# Patient Record
Sex: Female | Born: 1960 | Race: White | Hispanic: Yes | Marital: Married | Smoking: Never smoker
Health system: Southern US, Community
[De-identification: ages and names within clinical notes are randomized; demographics above are authoritative.]

## PROBLEM LIST (undated history)

## (undated) DIAGNOSIS — K8020 Calculus of gallbladder without cholecystitis without obstruction: Secondary | ICD-10-CM

## (undated) DIAGNOSIS — K219 Gastro-esophageal reflux disease without esophagitis: Secondary | ICD-10-CM

## (undated) DIAGNOSIS — E039 Hypothyroidism, unspecified: Secondary | ICD-10-CM

## (undated) DIAGNOSIS — F329 Major depressive disorder, single episode, unspecified: Secondary | ICD-10-CM

## (undated) DIAGNOSIS — F32A Depression, unspecified: Secondary | ICD-10-CM

## (undated) DIAGNOSIS — R7303 Prediabetes: Secondary | ICD-10-CM

## (undated) DIAGNOSIS — N6019 Diffuse cystic mastopathy of unspecified breast: Secondary | ICD-10-CM

## (undated) DIAGNOSIS — G43909 Migraine, unspecified, not intractable, without status migrainosus: Secondary | ICD-10-CM

## (undated) DIAGNOSIS — E785 Hyperlipidemia, unspecified: Secondary | ICD-10-CM

## (undated) DIAGNOSIS — R0989 Other specified symptoms and signs involving the circulatory and respiratory systems: Secondary | ICD-10-CM

## (undated) HISTORY — DX: Gastro-esophageal reflux disease without esophagitis: K21.9

## (undated) HISTORY — DX: Prediabetes: R73.03

## (undated) HISTORY — DX: Hypothyroidism, unspecified: E03.9

## (undated) HISTORY — DX: Diffuse cystic mastopathy of unspecified breast: N60.19

## (undated) HISTORY — DX: Depression, unspecified: F32.A

## (undated) HISTORY — DX: Hyperlipidemia, unspecified: E78.5

## (undated) HISTORY — DX: Calculus of gallbladder without cholecystitis without obstruction: K80.20

## (undated) HISTORY — DX: Major depressive disorder, single episode, unspecified: F32.9

## (undated) HISTORY — DX: Migraine, unspecified, not intractable, without status migrainosus: G43.909

## (undated) HISTORY — DX: Other specified symptoms and signs involving the circulatory and respiratory systems: R09.89

---

## 2000-09-04 ENCOUNTER — Other Ambulatory Visit: Admission: RE | Admit: 2000-09-04 | Discharge: 2000-09-04 | Payer: Self-pay | Admitting: *Deleted

## 2000-12-02 ENCOUNTER — Ambulatory Visit (HOSPITAL_COMMUNITY): Admission: RE | Admit: 2000-12-02 | Discharge: 2000-12-02 | Payer: Self-pay | Admitting: *Deleted

## 2000-12-02 ENCOUNTER — Encounter: Payer: Self-pay | Admitting: *Deleted

## 2002-01-21 ENCOUNTER — Other Ambulatory Visit: Admission: RE | Admit: 2002-01-21 | Discharge: 2002-01-21 | Payer: Self-pay | Admitting: *Deleted

## 2004-12-21 ENCOUNTER — Other Ambulatory Visit: Admission: RE | Admit: 2004-12-21 | Discharge: 2004-12-21 | Payer: Self-pay | Admitting: Internal Medicine

## 2006-03-12 HISTORY — PX: OTHER SURGICAL HISTORY: SHX169

## 2006-05-27 ENCOUNTER — Encounter: Admission: RE | Admit: 2006-05-27 | Discharge: 2006-05-27 | Payer: Self-pay | Admitting: Endocrinology

## 2006-07-01 ENCOUNTER — Encounter: Admission: RE | Admit: 2006-07-01 | Discharge: 2006-07-01 | Payer: Self-pay | Admitting: Endocrinology

## 2007-01-29 ENCOUNTER — Other Ambulatory Visit: Admission: RE | Admit: 2007-01-29 | Discharge: 2007-01-29 | Payer: Self-pay | Admitting: Internal Medicine

## 2009-10-19 ENCOUNTER — Ambulatory Visit (HOSPITAL_COMMUNITY): Admission: RE | Admit: 2009-10-19 | Discharge: 2009-10-19 | Payer: Self-pay | Admitting: Internal Medicine

## 2010-03-15 ENCOUNTER — Other Ambulatory Visit
Admission: RE | Admit: 2010-03-15 | Discharge: 2010-03-15 | Payer: Self-pay | Source: Home / Self Care | Admitting: Internal Medicine

## 2011-01-11 ENCOUNTER — Other Ambulatory Visit: Payer: Self-pay | Admitting: Emergency Medicine

## 2011-01-11 ENCOUNTER — Ambulatory Visit
Admission: RE | Admit: 2011-01-11 | Discharge: 2011-01-11 | Disposition: A | Discharge status: Home / Self Care | Payer: 59 | Source: Ambulatory Visit | Attending: Emergency Medicine | Admitting: Emergency Medicine

## 2011-01-11 DIAGNOSIS — R519 Headache, unspecified: Secondary | ICD-10-CM

## 2011-01-16 ENCOUNTER — Other Ambulatory Visit: Payer: Self-pay | Admitting: Internal Medicine

## 2011-01-16 DIAGNOSIS — R51 Headache: Secondary | ICD-10-CM

## 2011-01-16 DIAGNOSIS — Z8249 Family history of ischemic heart disease and other diseases of the circulatory system: Secondary | ICD-10-CM

## 2011-01-17 ENCOUNTER — Ambulatory Visit
Admission: RE | Admit: 2011-01-17 | Discharge: 2011-01-17 | Disposition: A | Discharge status: Home / Self Care | Payer: 59 | Source: Ambulatory Visit | Attending: Internal Medicine | Admitting: Internal Medicine

## 2011-01-17 DIAGNOSIS — Z8249 Family history of ischemic heart disease and other diseases of the circulatory system: Secondary | ICD-10-CM

## 2011-01-17 DIAGNOSIS — R51 Headache: Secondary | ICD-10-CM

## 2011-03-22 ENCOUNTER — Other Ambulatory Visit: Payer: Self-pay | Admitting: Internal Medicine

## 2011-03-22 DIAGNOSIS — K118 Other diseases of salivary glands: Secondary | ICD-10-CM

## 2011-03-27 ENCOUNTER — Ambulatory Visit
Admission: RE | Admit: 2011-03-27 | Discharge: 2011-03-27 | Disposition: A | Discharge status: Home / Self Care | Payer: 59 | Source: Ambulatory Visit | Attending: Internal Medicine | Admitting: Internal Medicine

## 2011-03-27 DIAGNOSIS — K118 Other diseases of salivary glands: Secondary | ICD-10-CM

## 2013-05-05 ENCOUNTER — Encounter: Payer: Self-pay | Admitting: Physician Assistant

## 2013-08-12 ENCOUNTER — Encounter: Payer: Self-pay | Admitting: Physician Assistant

## 2013-10-27 ENCOUNTER — Ambulatory Visit (INDEPENDENT_AMBULATORY_CARE_PROVIDER_SITE_OTHER): Payer: 59 | Admitting: Physician Assistant

## 2013-10-27 ENCOUNTER — Encounter: Payer: Self-pay | Admitting: Physician Assistant

## 2013-10-27 VITALS — BP 120/78 | HR 80 | Temp 97.9°F | Resp 16 | Ht 64.0 in | Wt 186.0 lb

## 2013-10-27 DIAGNOSIS — E559 Vitamin D deficiency, unspecified: Secondary | ICD-10-CM | POA: Insufficient documentation

## 2013-10-27 DIAGNOSIS — E039 Hypothyroidism, unspecified: Secondary | ICD-10-CM | POA: Insufficient documentation

## 2013-10-27 DIAGNOSIS — N912 Amenorrhea, unspecified: Secondary | ICD-10-CM

## 2013-10-27 DIAGNOSIS — F329 Major depressive disorder, single episode, unspecified: Secondary | ICD-10-CM

## 2013-10-27 DIAGNOSIS — R7303 Prediabetes: Secondary | ICD-10-CM

## 2013-10-27 DIAGNOSIS — E785 Hyperlipidemia, unspecified: Secondary | ICD-10-CM | POA: Insufficient documentation

## 2013-10-27 DIAGNOSIS — F325 Major depressive disorder, single episode, in full remission: Secondary | ICD-10-CM

## 2013-10-27 DIAGNOSIS — R7309 Other abnormal glucose: Secondary | ICD-10-CM

## 2013-10-27 DIAGNOSIS — F32A Depression, unspecified: Secondary | ICD-10-CM

## 2013-10-27 DIAGNOSIS — K219 Gastro-esophageal reflux disease without esophagitis: Secondary | ICD-10-CM | POA: Insufficient documentation

## 2013-10-27 DIAGNOSIS — Z79899 Other long term (current) drug therapy: Secondary | ICD-10-CM

## 2013-10-27 HISTORY — DX: Major depressive disorder, single episode, in full remission: F32.5

## 2013-10-27 LAB — CBC WITH DIFFERENTIAL/PLATELET
BASOS ABS: 0 10*3/uL (ref 0.0–0.1)
BASOS PCT: 0 % (ref 0–1)
EOS PCT: 1 % (ref 0–5)
Eosinophils Absolute: 0.1 10*3/uL (ref 0.0–0.7)
HEMATOCRIT: 40.6 % (ref 36.0–46.0)
Hemoglobin: 13.9 g/dL (ref 12.0–15.0)
Lymphocytes Relative: 33 % (ref 12–46)
Lymphs Abs: 2.3 10*3/uL (ref 0.7–4.0)
MCH: 30 pg (ref 26.0–34.0)
MCHC: 34.2 g/dL (ref 30.0–36.0)
MCV: 87.7 fL (ref 78.0–100.0)
MONO ABS: 0.4 10*3/uL (ref 0.1–1.0)
Monocytes Relative: 5 % (ref 3–12)
NEUTROS ABS: 4.3 10*3/uL (ref 1.7–7.7)
Neutrophils Relative %: 61 % (ref 43–77)
Platelets: 239 10*3/uL (ref 150–400)
RBC: 4.63 MIL/uL (ref 3.87–5.11)
RDW: 13.5 % (ref 11.5–15.5)
WBC: 7.1 10*3/uL (ref 4.0–10.5)

## 2013-10-27 LAB — HEMOGLOBIN A1C
Hgb A1c MFr Bld: 6 % — ABNORMAL HIGH (ref <5.7)
Mean Plasma Glucose: 126 mg/dL — ABNORMAL HIGH (ref <117)

## 2013-10-27 NOTE — Progress Notes (Signed)
Assessment and Plan:  Hypertension: Continue medication, monitor blood pressure at home. Continue DASH diet. Cholesterol: Continue diet and exercise. Check cholesterol.  Pre-diabetes-Continue diet and exercise. Check A1C Vitamin D Def- check level and continue medications.  Left plantar faciitis- conservative measures, aleve, RICE Irreg menses- likely menopause- will refer to OB GYN for evaluation Depression/insomnia- try melatonin, increase exercise  Continue diet and meds as discussed. Further disposition pending results of labs.  HPI 53 y.o. female  presents for 3 month follow up with hypertension, hyperlipidemia, prediabetes and vitamin D. Her blood pressure has been controlled at home, today their BP is BP: 120/78 mmHg She does not workout. She denies chest pain, shortness of breath, dizziness.  She has been working on diet and exercise for prediabetes, and denies paresthesia of the feet, polydipsia and polyuria.  Patient is on Vitamin D supplement.   She has been under a lot of stress due to her family and husband. She does not want a pill that would cause addiction. She has been crying a lot and having a hard time sleeping at night. She has left heel pain from injuring it on a trampoline 2 months ago and it is getting better.  She has been having irregular menses, with not having one for 3 months and then having very light ones. Last PAP was 2012.  She is on thyroid medication. Her medication was not changed last visit. Patient denies change in energy level, diarrhea and heat / cold intolerance.   Current Medications:    Medication List       This list is accurate as of: 10/27/13 12:57 PM.  Always use your most recent med list.               IRON PO  Take by mouth daily.     levothyroxine 50 MCG tablet  Commonly known as:  SYNTHROID, LEVOTHROID  Take 50 mcg by mouth daily before breakfast.     VITAMIN D PO  Take 5,000 Int'l Units by mouth daily.       Medical  History: No past medical history on file. Allergies: Allergies no known allergies   Review of Systems: [X]  = complains of  [ ]  = denies  General: Fatigue [ ]  Fever [ ]  Chills [ ]  Weakness [ ]   Insomnia [x]  Eyes: Redness [ ]  Blurred vision [ ]  Diplopia [ ]   ENT: Congestion [ ]  Sinus Pain [ ]  Post Nasal Drip [ ]  Sore Throat [ ]  Earache [ ]   Cardiac: Chest pain/pressure [ ]  SOB [ ]  Orthopnea [ ]   Palpitations [ ]   Paroxysmal nocturnal dyspnea[ ]  Claudication [ ]  Edema [ ]   Pulmonary: Cough [ ]  Wheezing[ ]   SOB [ ]   Snoring [ ]   GI: Nausea [ ]  Vomiting[ ]  Dysphagia[ ]  Heartburn[ ]  Abdominal pain [ ]  Constipation [ ] ; Diarrhea [ ] ; BRBPR [ ]  Melena[ ]  GU: Hematuria[ ]  Dysuria [ ]  Nocturia[ ]  Urgency [ ]   Hesitancy [ ]  Discharge [ ]  Neuro: Headaches[ ]  Vertigo[ ]  Paresthesias[ ]  Spasm [ ]  Speech changes [ ]  Incoordination [ ]   Ortho: Arthritis [x ] Joint pain [ ]  Muscle pain [ ]  Joint swelling [ ]  Back Pain [ ]  Skin:  Rash [ ]   Pruritis [ ]  Change in skin lesion [ ]   Psych: Depression[ x] Anxiety[x ] Confusion [ ]  Memory loss [ ]   Heme/Lypmh: Bleeding [ ]  Bruising [ ]  Enlarged lymph nodes [ ]   Endocrine: Visual blurring [ ]   Paresthesia [ ]  Polyuria [ ]  Polydypsea [ ]    Heat/cold intolerance [ ]  Hypoglycemia [ ]   Family history- Review and unchanged Social history- Review and unchanged Physical Exam: BP 120/78  Pulse 80  Temp(Src) 97.9 F (36.6 C)  Resp 16  Ht 5\' 4"  (1.626 m)  Wt 186 lb (84.369 kg)  BMI 31.91 kg/m2 Wt Readings from Last 3 Encounters:  10/27/13 186 lb (84.369 kg)   General Appearance: Well nourished, in no apparent distress. Eyes: PERRLA, EOMs, conjunctiva no swelling or erythema Sinuses: No Frontal/maxillary tenderness ENT/Mouth: Ext aud canals clear, TMs without erythema, bulging. No erythema, swelling, or exudate on post pharynx.  Tonsils not swollen or erythematous. Hearing normal.  Neck: Supple, thyroid normal.  Respiratory: Respiratory effort normal, BS equal  bilaterally without rales, rhonchi, wheezing or stridor.  Cardio: RRR with no MRGs. Brisk peripheral pulses without edema.  Abdomen: Soft, + BS.  Non tender, no guarding, rebound, hernias, masses. Lymphatics: Non tender without lymphadenopathy.  Musculoskeletal: Full ROM, 5/5 strength, normal gait. + Left heel pain Skin: Warm, dry without rashes, lesions, ecchymosis.  Neuro: Cranial nerves intact. Normal muscle tone, no cerebellar symptoms. Sensation intact.  Psych: Awake and oriented X 3, normal affect, Insight and Judgment appropriate.    Quentin Mullingollier, Amanda 12:10 PM

## 2013-10-27 NOTE — Patient Instructions (Addendum)
Can try MELATONIN 5mg  1-2 at night Or can try Tylenol PM 1-2 at night  Insomnia Insomnia is frequent trouble falling and/or staying asleep. Insomnia can be a long term problem or a short term problem. Both are common. Insomnia can be a short term problem when the wakefulness is related to a certain stress or worry. Long term insomnia is often related to ongoing stress during waking hours and/or poor sleeping habits. Overtime, sleep deprivation itself can make the problem worse. Every little thing feels more severe because you are overtired and your ability to cope is decreased. CAUSES   Stress, anxiety, and depression.  Poor sleeping habits.  Distractions such as TV in the bedroom.  Naps close to bedtime.  Engaging in emotionally charged conversations before bed.  Technical reading before sleep.  Alcohol and other sedatives. They may make the problem worse. They can hurt normal sleep patterns and normal dream activity.  Stimulants such as caffeine for several hours prior to bedtime.  Pain syndromes and shortness of breath can cause insomnia.  Exercise late at night.  Changing time zones may cause sleeping problems (jet lag). It is sometimes helpful to have someone observe your sleeping patterns. They should look for periods of not breathing during the night (sleep apnea). They should also look to see how long those periods last. If you live alone or observers are uncertain, you can also be observed at a sleep clinic where your sleep patterns will be professionally monitored. Sleep apnea requires a checkup and treatment. Give your caregivers your medical history. Give your caregivers observations your family has made about your sleep.  SYMPTOMS   Not feeling rested in the morning.  Anxiety and restlessness at bedtime.  Difficulty falling and staying asleep. TREATMENT   Your caregiver may prescribe treatment for an underlying medical disorders. Your caregiver can give advice or  help if you are using alcohol or other drugs for self-medication. Treatment of underlying problems will usually eliminate insomnia problems.  Medications can be prescribed for short time use. They are generally not recommended for lengthy use.  Over-the-counter sleep medicines are not recommended for lengthy use. They can be habit forming.  You can promote easier sleeping by making lifestyle changes such as:  Using relaxation techniques that help with breathing and reduce muscle tension.  Exercising earlier in the day.  Changing your diet and the time of your last meal. No night time snacks.  Establish a regular time to go to bed.  Counseling can help with stressful problems and worry.  Soothing music and white noise may be helpful if there are background noises you cannot remove.  Stop tedious detailed work at least one hour before bedtime. HOME CARE INSTRUCTIONS   Keep a diary. Inform your caregiver about your progress. This includes any medication side effects. See your caregiver regularly. Take note of:  Times when you are asleep.  Times when you are awake during the night.  The quality of your sleep.  How you feel the next day. This information will help your caregiver care for you.  Get out of bed if you are still awake after 15 minutes. Read or do some quiet activity. Keep the lights down. Wait until you feel sleepy and go back to bed.  Keep regular sleeping and waking hours. Avoid naps.  Exercise regularly.  Avoid distractions at bedtime. Distractions include watching television or engaging in any intense or detailed activity like attempting to balance the household checkbook.  Develop a bedtime  ritual. Keep a familiar routine of bathing, brushing your teeth, climbing into bed at the same time each night, listening to soothing music. Routines increase the success of falling to sleep faster.  Use relaxation techniques. This can be using breathing and muscle tension  release routines. It can also include visualizing peaceful scenes. You can also help control troubling or intruding thoughts by keeping your mind occupied with boring or repetitive thoughts like the old concept of counting sheep. You can make it more creative like imagining planting one beautiful flower after another in your backyard garden.  During your day, work to eliminate stress. When this is not possible use some of the previous suggestions to help reduce the anxiety that accompanies stressful situations. MAKE SURE YOU:   Understand these instructions.  Will watch your condition.  Will get help right away if you are not doing well or get worse. Document Released: 02/24/2000 Document Revised: 05/21/2011 Document Reviewed: 03/26/2007 [facility] Patient Information 2015 [place], [place]. This information is not intended to replace advice given to you by your health care provider. Make sure you discuss any questions you have with your health care provider.  Plantar Fasciitis (Heel Spur Syndrome) with Rehab The plantar fascia is a fibrous, ligament-like, soft-tissue structure that spans the bottom of the foot. Plantar fasciitis is a condition that causes pain in the foot due to inflammation of the tissue. SYMPTOMS   Pain and tenderness on the underneath side of the foot.  Pain that worsens with standing or walking. CAUSES  Plantar fasciitis is caused by irritation and injury to the plantar fascia on the underneath side of the foot. Common mechanisms of injury include:  Direct trauma to bottom of the foot.  Damage to a small nerve that runs under the foot where the main fascia attaches to the heel bone.  Stress placed on the plantar fascia due to bone spurs. RISK INCREASES WITH:   Activities that place stress on the plantar fascia (running, jumping, pivoting, or cutting).  Poor strength and flexibility.  Improperly fitted shoes.  Tight calf muscles.  Flat feet.  Failure to  warm-up properly before activity.  Obesity. PREVENTION  Warm up and stretch properly before activity.  Allow for adequate recovery between workouts.  Maintain physical fitness:  Strength, flexibility, and endurance.  Cardiovascular fitness.  Maintain a health body weight.  Avoid stress on the plantar fascia.  Wear properly fitted shoes, including arch supports for individuals who have flat feet. PROGNOSIS  If treated properly, then the symptoms of plantar fasciitis usually resolve without surgery. However, occasionally surgery is necessary. RELATED COMPLICATIONS   Recurrent symptoms that may result in a chronic condition.  Problems of the lower back that are caused by compensating for the injury, such as limping.  Pain or weakness of the foot during push-off following surgery.  Chronic inflammation, scarring, and partial or complete fascia tear, occurring more often from repeated injections. TREATMENT  Treatment initially involves the use of ice and medication to help reduce pain and inflammation. The use of strengthening and stretching exercises may help reduce pain with activity, especially stretches of the Achilles tendon. These exercises may be performed at home or with a therapist. Your caregiver may recommend that you use heel cups of arch supports to help reduce stress on the plantar fascia. Occasionally, corticosteroid injections are given to reduce inflammation. If symptoms persist for greater than 6 months despite non-surgical (conservative), then surgery may be recommended.  MEDICATION   If pain medication is  necessary, then nonsteroidal anti-inflammatory medications, such as aspirin and ibuprofen, or other minor pain relievers, such as acetaminophen, are often recommended.  Do not take pain medication within 7 days before surgery.  Prescription pain relievers may be given if deemed necessary by your caregiver. Use only as directed and only as much as you  need.  Corticosteroid injections may be given by your caregiver. These injections should be reserved for the most serious cases, because they may only be given a certain number of times. HEAT AND COLD  Cold treatment (icing) relieves pain and reduces inflammation. Cold treatment should be applied for 10 to 15 minutes every 2 to 3 hours for inflammation and pain and immediately after any activity that aggravates your symptoms. Use ice packs or massage the area with a piece of ice (ice massage).  Heat treatment may be used prior to performing the stretching and strengthening activities prescribed by your caregiver, physical therapist, or athletic trainer. Use a heat pack or soak the injury in warm water. SEEK IMMEDIATE MEDICAL CARE IF:  Treatment seems to offer no benefit, or the condition worsens.  Any medications produce adverse side effects. EXERCISES RANGE OF MOTION (ROM) AND STRETCHING EXERCISES - Plantar Fasciitis (Heel Spur Syndrome) These exercises may help you when beginning to rehabilitate your injury. Your symptoms may resolve with or without further involvement from your physician, physical therapist or athletic trainer. While completing these exercises, remember:   Restoring tissue flexibility helps normal motion to return to the joints. This allows healthier, less painful movement and activity.  An effective stretch should be held for at least 30 seconds.  A stretch should never be painful. You should only feel a gentle lengthening or release in the stretched tissue. RANGE OF MOTION - Toe Extension, Flexion  Sit with your right / left leg crossed over your opposite knee.  Grasp your toes and gently pull them back toward the top of your foot. You should feel a stretch on the bottom of your toes and/or foot.  Hold this stretch for __________ seconds.  Now, gently pull your toes toward the bottom of your foot. You should feel a stretch on the top of your toes and or  foot.  Hold this stretch for __________ seconds. Repeat __________ times. Complete this stretch __________ times per day.  RANGE OF MOTION - Ankle Dorsiflexion, Active Assisted  Remove shoes and sit on a chair that is preferably not on a carpeted surface.  Place right / left foot under knee. Extend your opposite leg for support.  Keeping your heel down, slide your right / left foot back toward the chair until you feel a stretch at your ankle or calf. If you do not feel a stretch, slide your bottom forward to the edge of the chair, while still keeping your heel down.  Hold this stretch for __________ seconds. Repeat __________ times. Complete this stretch __________ times per day.  STRETCH - Gastroc, Standing  Place hands on wall.  Extend right / left leg, keeping the front knee somewhat bent.  Slightly point your toes inward on your back foot.  Keeping your right / left heel on the floor and your knee straight, shift your weight toward the wall, not allowing your back to arch.  You should feel a gentle stretch in the right / left calf. Hold this position for __________ seconds. Repeat __________ times. Complete this stretch __________ times per day. STRETCH - Soleus, Standing  Place hands on wall.  Extend right /  left leg, keeping the other knee somewhat bent.  Slightly point your toes inward on your back foot.  Keep your right / left heel on the floor, bend your back knee, and slightly shift your weight over the back leg so that you feel a gentle stretch deep in your back calf.  Hold this position for __________ seconds. Repeat __________ times. Complete this stretch __________ times per day. STRETCH - Gastrocsoleus, Standing  Note: This exercise can place a lot of stress on your foot and ankle. Please complete this exercise only if specifically instructed by your caregiver.   Place the ball of your right / left foot on a step, keeping your other foot firmly on the same  step.  Hold on to the wall or a rail for balance.  Slowly lift your other foot, allowing your body weight to press your heel down over the edge of the step.  You should feel a stretch in your right / left calf.  Hold this position for __________ seconds.  Repeat this exercise with a slight bend in your right / left knee. Repeat __________ times. Complete this stretch __________ times per day.  STRENGTHENING EXERCISES - Plantar Fasciitis (Heel Spur Syndrome)  These exercises may help you when beginning to rehabilitate your injury. They may resolve your symptoms with or without further involvement from your physician, physical therapist or athletic trainer. While completing these exercises, remember:   Muscles can gain both the endurance and the strength needed for everyday activities through controlled exercises.  Complete these exercises as instructed by your physician, physical therapist or athletic trainer. Progress the resistance and repetitions only as guided. STRENGTH - Towel Curls  Sit in a chair positioned on a non-carpeted surface.  Place your foot on a towel, keeping your heel on the floor.  Pull the towel toward your heel by only curling your toes. Keep your heel on the floor.  If instructed by your physician, physical therapist or athletic trainer, add ____________________ at the end of the towel. Repeat __________ times. Complete this exercise __________ times per day. STRENGTH - Ankle Inversion  Secure one end of a rubber exercise band/tubing to a fixed object (table, pole). Loop the other end around your foot just before your toes.  Place your fists between your knees. This will focus your strengthening at your ankle.  Slowly, pull your big toe up and in, making sure the band/tubing is positioned to resist the entire motion.  Hold this position for __________ seconds.  Have your muscles resist the band/tubing as it slowly pulls your foot back to the starting  position. Repeat __________ times. Complete this exercises __________ times per day.  Document Released: 02/26/2005 Document Revised: 05/21/2011 Document Reviewed: 06/10/2008 [facility] Patient Information 2015 [place], [place]. This information is not intended to replace advice given to you by your health care provider. Make sure you discuss any questions you have with your health care provider.

## 2013-10-28 LAB — HEPATIC FUNCTION PANEL
ALBUMIN: 4.5 g/dL (ref 3.5–5.2)
ALT: 34 U/L (ref 0–35)
AST: 23 U/L (ref 0–37)
Alkaline Phosphatase: 85 U/L (ref 39–117)
Bilirubin, Direct: 0.1 mg/dL (ref 0.0–0.3)
Indirect Bilirubin: 0.5 mg/dL (ref 0.2–1.2)
TOTAL PROTEIN: 7.2 g/dL (ref 6.0–8.3)
Total Bilirubin: 0.6 mg/dL (ref 0.2–1.2)

## 2013-10-28 LAB — BASIC METABOLIC PANEL WITH GFR
BUN: 12 mg/dL (ref 6–23)
CALCIUM: 9.7 mg/dL (ref 8.4–10.5)
CO2: 26 mEq/L (ref 19–32)
CREATININE: 0.62 mg/dL (ref 0.50–1.10)
Chloride: 103 mEq/L (ref 96–112)
Glucose, Bld: 86 mg/dL (ref 70–99)
Potassium: 4.3 mEq/L (ref 3.5–5.3)
Sodium: 138 mEq/L (ref 135–145)

## 2013-10-28 LAB — FOLLICLE STIMULATING HORMONE: FSH: 25.3 m[IU]/mL

## 2013-10-28 LAB — MAGNESIUM: Magnesium: 1.9 mg/dL (ref 1.5–2.5)

## 2013-10-28 LAB — VITAMIN B12: VITAMIN B 12: 301 pg/mL (ref 211–911)

## 2013-10-28 LAB — LIPID PANEL
Cholesterol: 201 mg/dL — ABNORMAL HIGH (ref 0–200)
HDL: 45 mg/dL (ref >39)
LDL Cholesterol: 127 mg/dL — ABNORMAL HIGH (ref 0–99)
TRIGLYCERIDES: 145 mg/dL (ref <150)
Total CHOL/HDL Ratio: 4.5 Ratio
VLDL: 29 mg/dL (ref 0–40)

## 2013-10-28 LAB — TSH: TSH: 0.357 u[IU]/mL (ref 0.350–4.500)

## 2013-10-28 LAB — VITAMIN D 25 HYDROXY (VIT D DEFICIENCY, FRACTURES): VIT D 25 HYDROXY: 70 ng/mL (ref 30–89)

## 2013-10-28 LAB — INSULIN, FASTING: INSULIN FASTING, SERUM: 13 u[IU]/mL (ref 3–28)

## 2014-02-08 ENCOUNTER — Other Ambulatory Visit (HOSPITAL_COMMUNITY)
Admission: RE | Admit: 2014-02-08 | Discharge: 2014-02-08 | Disposition: A | Discharge status: Home / Self Care | Payer: 59 | Source: Ambulatory Visit | Attending: Physician Assistant | Admitting: Physician Assistant

## 2014-02-08 ENCOUNTER — Encounter: Payer: Self-pay | Admitting: Physician Assistant

## 2014-02-08 ENCOUNTER — Ambulatory Visit (INDEPENDENT_AMBULATORY_CARE_PROVIDER_SITE_OTHER): Payer: 59 | Admitting: Physician Assistant

## 2014-02-08 VITALS — BP 110/72 | HR 84 | Temp 97.9°F | Resp 16 | Ht 64.0 in | Wt 179.0 lb

## 2014-02-08 DIAGNOSIS — F329 Major depressive disorder, single episode, unspecified: Secondary | ICD-10-CM

## 2014-02-08 DIAGNOSIS — R6889 Other general symptoms and signs: Secondary | ICD-10-CM

## 2014-02-08 DIAGNOSIS — R7309 Other abnormal glucose: Secondary | ICD-10-CM

## 2014-02-08 DIAGNOSIS — I10 Essential (primary) hypertension: Secondary | ICD-10-CM

## 2014-02-08 DIAGNOSIS — Z124 Encounter for screening for malignant neoplasm of cervix: Secondary | ICD-10-CM

## 2014-02-08 DIAGNOSIS — E039 Hypothyroidism, unspecified: Secondary | ICD-10-CM

## 2014-02-08 DIAGNOSIS — Z1151 Encounter for screening for human papillomavirus (HPV): Secondary | ICD-10-CM | POA: Diagnosis present

## 2014-02-08 DIAGNOSIS — E559 Vitamin D deficiency, unspecified: Secondary | ICD-10-CM

## 2014-02-08 DIAGNOSIS — Z1159 Encounter for screening for other viral diseases: Secondary | ICD-10-CM

## 2014-02-08 DIAGNOSIS — Z0001 Encounter for general adult medical examination with abnormal findings: Secondary | ICD-10-CM

## 2014-02-08 DIAGNOSIS — R7303 Prediabetes: Secondary | ICD-10-CM

## 2014-02-08 DIAGNOSIS — Z01419 Encounter for gynecological examination (general) (routine) without abnormal findings: Secondary | ICD-10-CM | POA: Diagnosis present

## 2014-02-08 DIAGNOSIS — F32A Depression, unspecified: Secondary | ICD-10-CM

## 2014-02-08 DIAGNOSIS — E785 Hyperlipidemia, unspecified: Secondary | ICD-10-CM

## 2014-02-08 DIAGNOSIS — Z23 Encounter for immunization: Secondary | ICD-10-CM

## 2014-02-08 LAB — CBC WITH DIFFERENTIAL/PLATELET
BASOS PCT: 0 % (ref 0–1)
Basophils Absolute: 0 10*3/uL (ref 0.0–0.1)
EOS ABS: 0.1 10*3/uL (ref 0.0–0.7)
EOS PCT: 1 % (ref 0–5)
HEMATOCRIT: 38 % (ref 36.0–46.0)
Hemoglobin: 13.5 g/dL (ref 12.0–15.0)
Lymphocytes Relative: 37 % (ref 12–46)
Lymphs Abs: 2.3 10*3/uL (ref 0.7–4.0)
MCH: 30.5 pg (ref 26.0–34.0)
MCHC: 35.5 g/dL (ref 30.0–36.0)
MCV: 86 fL (ref 78.0–100.0)
MONOS PCT: 5 % (ref 3–12)
MPV: 11 fL (ref 9.4–12.4)
Monocytes Absolute: 0.3 10*3/uL (ref 0.1–1.0)
NEUTROS PCT: 57 % (ref 43–77)
Neutro Abs: 3.6 10*3/uL (ref 1.7–7.7)
Platelets: 241 10*3/uL (ref 150–400)
RBC: 4.42 MIL/uL (ref 3.87–5.11)
RDW: 13 % (ref 11.5–15.5)
WBC: 6.3 10*3/uL (ref 4.0–10.5)

## 2014-02-08 LAB — HEPATIC FUNCTION PANEL
ALBUMIN: 4.6 g/dL (ref 3.5–5.2)
ALT: 29 U/L (ref 0–35)
AST: 24 U/L (ref 0–37)
Alkaline Phosphatase: 80 U/L (ref 39–117)
BILIRUBIN DIRECT: 0.1 mg/dL (ref 0.0–0.3)
Indirect Bilirubin: 0.6 mg/dL (ref 0.2–1.2)
TOTAL PROTEIN: 7.3 g/dL (ref 6.0–8.3)
Total Bilirubin: 0.7 mg/dL (ref 0.2–1.2)

## 2014-02-08 LAB — BASIC METABOLIC PANEL WITH GFR
BUN: 15 mg/dL (ref 6–23)
CALCIUM: 10 mg/dL (ref 8.4–10.5)
CO2: 28 mEq/L (ref 19–32)
CREATININE: 0.68 mg/dL (ref 0.50–1.10)
Chloride: 103 mEq/L (ref 96–112)
GFR, Est Non African American: >89 mL/min
GLUCOSE: 102 mg/dL — AB (ref 70–99)
Potassium: 4 mEq/L (ref 3.5–5.3)
SODIUM: 137 meq/L (ref 135–145)

## 2014-02-08 LAB — LIPID PANEL
CHOLESTEROL: 196 mg/dL (ref 0–200)
HDL: 38 mg/dL — ABNORMAL LOW (ref >39)
LDL Cholesterol: 128 mg/dL — ABNORMAL HIGH (ref 0–99)
TRIGLYCERIDES: 150 mg/dL — AB (ref <150)
Total CHOL/HDL Ratio: 5.2 Ratio
VLDL: 30 mg/dL (ref 0–40)

## 2014-02-08 LAB — IRON AND TIBC
%SAT: 53 % (ref 20–55)
IRON: 161 ug/dL — AB (ref 42–145)
TIBC: 306 ug/dL (ref 250–470)
UIBC: 145 ug/dL (ref 125–400)

## 2014-02-08 LAB — FERRITIN: FERRITIN: 161 ng/mL (ref 10–291)

## 2014-02-08 LAB — TSH: TSH: 0.32 u[IU]/mL — ABNORMAL LOW (ref 0.350–4.500)

## 2014-02-08 LAB — MAGNESIUM: Magnesium: 1.8 mg/dL (ref 1.5–2.5)

## 2014-02-08 LAB — VITAMIN B12: VITAMIN B 12: 680 pg/mL (ref 211–911)

## 2014-02-08 NOTE — Progress Notes (Signed)
Complete Physical  Assessment and Plan: 1. Prediabetes Discussed general issues about diabetes pathophysiology and management., Educational material distributed., Suggested low cholesterol diet., Encouraged aerobic exercise., Discussed foot care., Reminded to get yearly retinal exam. - EKG 12-Lead  2. Hypothyroidism, unspecified hypothyroidism type Hypothyroidism-check TSH level, continue medications the same, reminded to take on an empty stomach 30-260mins before food.  Will send labs to Dr. Horald PollenBalen  3. Hyperlipidemia -continue medications, check lipids, decrease fatty foods, increase activity.  - EKG 12-Lead - US, RETROPERITNL ABD,  LTD  4. Vitamin D deficiency Check labs  5. Depression Depression/Anxiety- continue medications, stress management techniques discussed, increase water, good sleep hygiene discussed, increase exercise, and increase veggies.   6. Encounter for general adult medical examination with abnormal findings - CBC with Differential - BASIC METABOLIC PANEL WITH GFR - Hepatic function panel - Lipid panel - TSH - Hemoglobin A1c - Insulin, fasting - Vit D  25 hydroxy (rtn osteoporosis monitoring) - Urinalysis, Routine w reflex microscopic - Microalbumin / creatinine urine ratio - Vitamin B12 - Magnesium - Urine culture - Iron and TIBC - Ferritin  7. Screening for viral disease - Hepatitis A antibody, total - Hepatitis B core antibody, total - Hepatitis B e antibody - Hepatitis B surface antibody - Hepatitis C antibody  8. Screening for cervical cancer - Cytology - PAP  9. Essential hypertension - US, RETROPERITNL ABD,  LTD  10. Need for prophylactic vaccination and inoculation against influenza - Flu vaccine greater than or equal to 3yo with preservative IM  11. Need for Tdap vaccination TDAP  12. Need MGM  13. Needs to schedule colonoscopy  Discussed med's effects and SE's. Screening labs and tests as requested with regular follow-up as  recommended.  HPI  53 y.o. female  presents for a complete physical.   Her blood pressure has been controlled at home, today their BP is BP: 110/72 mmHg She does workout. She denies chest pain, shortness of breath, dizziness.  She is not on cholesterol medication and denies myalgias. Her cholesterol is at goal. The cholesterol last visit was:   Lab Results  Component Value Date   CHOL 201* 10/27/2013   HDL 45 10/27/2013   LDLCALC 127* 10/27/2013   TRIG 145 10/27/2013   CHOLHDL 4.5 10/27/2013    She has been working on diet and exercise for prediabetes, and denies polydipsia, polyuria and visual disturbances. Last A1C in the office was:  Lab Results  Component Value Date   HGBA1C 6.0* 10/27/2013   Patient is on Vitamin D supplement.   Lab Results  Component Value Date   VD25OH 1670 10/27/2013     She is on thyroid medication. Her medication was not changed last visit. Patient denies fatigue, weight changes, heat/cold intolerance, bowel/skin changes or CVS symptoms  Lab Results  Component Value Date   TSH 0.357 10/27/2013  .     Current Medications:  Current Outpatient Prescriptions on File Prior to Visit  Medication Sig Dispense Refill  . Cholecalciferol (VITAMIN D PO) Take 5,000 Int'l Units by mouth daily.    . IRON PO Take by mouth daily.    Marland Kitchen. levothyroxine (SYNTHROID, LEVOTHROID) 50 MCG tablet Take 50 mcg by mouth daily before breakfast.     No current facility-administered medications on file prior to visit.   Health Maintenance:   Immunization History  Administered Date(s) Administered  . Td 03/12/2002   Tetanus: 2004 DUE  Pneumovax: Flu vaccine: Zostavax: LMP:09/2013 Pap: 2012 due today MGM: 2013 DEXA:  Colonoscopy:2005 due 2015 EGD: Neg MRA head 01/2011  Dr. Ferdinand CavaHall Derm Dr. Romero BellingBalin endocrine  Patient Care Team: Lucky CowboyWilliam McKeown, MD as PCP - General (Internal Medicine)  Allergies: Allergies no known allergies Medical History:  Past Medical History   Diagnosis Date  . Depression   . Hypothyroidism   . Migraine   . Hyperlipidemia   . Prediabetes   . Labile hypertension   . GERD (gastroesophageal reflux disease)   . Fibrocystic breast disease   . Cholelithiasis    Surgical History:  Past Surgical History  Procedure Laterality Date  . Thyroid ablation  2008   Family History:  Family History  Problem Relation Age of Onset  . Hypertension Mother   . Hyperlipidemia Father   . Aneurysm Sister     brain  . Heart attack Paternal Grandfather [id number]   Social History:  History  Substance Use Topics  . Smoking status: Never Smoker   . Smokeless tobacco: Never Used  . Alcohol Use: 0.0 oz/week    0 Not specified per week     Comment: rare     Review of Systems: [X]  = complains of  [ ]  = denies  General: Fatigue [ ]  Fever [ ]  Chills [ ]  Weakness [ ]   Insomnia [ ] Weight change [ ]  Night sweats [ ]   Change in appetite [ ]  Head: Head Trauma [ ]  Eyes: Wears glasses or corrective lens [ ]  Redness [ ]  Blurred vision [ ]  Diplopia [ ]  Discharge [ ]  Floaters [ ]  ZOX:WRUEAVWENT:Earache [ ]  hearing loss [ ]  Tinnitus [ ]  Ear Discharge [ ]   Congestion [ ]  Sinus Pain [ ]  Post Nasal Drip [ ]  Nose Bleeds [ ]  Rhinorrhea [ ]    Difficulty Swallowing [ ]  Snoring [ ]  Sore Throat [ ]  Cardiac:   Chest pain/pressure [ ]  SOB [ ]  Orthopnea [ ]   Palpitations [ ]   Paroxysmal nocturnal dyspnea[ ]  Claudication [ ]  Edema [ ]  Difficulty walking around block or climbing stairs [ ]  Pulmonary: Cough [ ]  Wheezing[ ]   SOB [ ]   Pleurisy [ ]  Asthma [ ]  GI: Nausea [ ]  Vomiting[ ]  Dysphagia[ ]  Heartburn[ ]  Abdominal pain [ ]  Constipation [ ] ; Diarrhea [ ]  BRBPR [ ]  Melena[ ]  Bloating [ ]  Hemorrhoids [ ]  Incontinence [ ]  GU: Hematuria[ ]  Dysuria [ ]  Nocturia[ ]  Urgency [ ]   Hesitancy [ ]  Discharge [ ]  Frequency [ ]  Incontinence [ ]  Breast:  Dimpling [ ]  Breast lumps [ ]   Breast Lesions [ ]  Nipple discharge [ ]    Neuro: Headaches[ ]  Vertigo[ ]  Paresthesias[ ]  Spasm [ ]  Speech changes  [ ]  Incoordination [ ]  Dizziness [ ]  Numbness [ ]  Ortho: Arthritis [ ]  Joint pain [ ]  Muscle pain [ ]  Joint swelling [ ]  Back Pain [ ]  Weakness [ ]  Stiffness [ ]  Skin:  Rash [ ]   Pruritis [ ]  Change in skin lesion [ ]  Change in hair [ ]  Change in nails [ ]  Psych: Depression[ ]  Anxiety[ ]  Stress [ ]  Confusion [ ]  Memory loss [ ]   Heme/Lymph: Bleeding [ ]  Bruising [ ]  History of anemia [ ]  Enlarged lymph nodes [ ]   Endocrine: Visual blurring [ ]  Paresthesia [ ]  Polyuria [ ]  Polydipsia [ ]  Polyphagia [ ]   Heat/cold intolerance [ ]  Hypoglycemia [ ]  Thyroid Issues [ ]  Diabetes [ ]   Physical Exam: Estimated body mass index is 30.71 kg/(m^2) as calculated from the following:   Height  as of this encounter: 5\' 4"  (1.626 m).   Weight as of this encounter: 179 lb (81.194 kg). BP 110/72 mmHg  Pulse 84  Temp(Src) 97.9 F (36.6 C)  Resp 16  Ht 5\' 4"  (1.626 m)  Wt 179 lb (81.194 kg)  BMI 30.71 kg/m2 General Appearance: Well nourished, in no apparent distress.  Eyes: PERRLA, EOMs, conjunctiva no swelling or erythema, normal fundi and vessels.  Sinuses: No Frontal/maxillary tenderness  ENT/Mouth: Ext aud canals clear, normal light reflex with TMs without erythema, bulging. Good dentition. No erythema, swelling, or exudate on post pharynx. Tonsils not swollen or erythematous. Hearing normal.  Neck: Supple, thyroid normal. No bruits  Respiratory: Respiratory effort normal, BS equal bilaterally without rales, rhonchi, wheezing or stridor.  Cardio: RRR without murmurs, rubs or gallops. Brisk peripheral pulses without edema.  Chest: symmetric, with normal excursions and percussion.  Breasts: Symmetric, without lumps, nipple discharge, retractions.  Abdomen: Soft, nontender, no guarding, rebound, hernias, masses, or organomegaly. .  Lymphatics: Non tender without lymphadenopathy.  Genitourinary: normal external genitalia, vulva, vagina, cervix, uterus and adnexa, PAP: Pap smear done today. Musculoskeletal:  Full ROM all peripheral extremities,5/5 strength, and normal gait.  Skin: Warm, dry without rashes, lesions, ecchymosis. Neuro: Cranial nerves intact, reflexes equal bilaterally. Normal muscle tone, no cerebellar symptoms. Sensation intact.  Psych: Awake and oriented X 3, normal affect, Insight and Judgment appropriate.   EKG: WNL no changes. AORTA SCAN: WNL    [name] 10:45 AM [facility]

## 2014-02-08 NOTE — Patient Instructions (Addendum)
You need to call your stomach doctor for another colonoscopy.  You can call [facility] at [phone] to make an appointment for a mammogram.   What is the TMJ? The temporomandibular (tem-PUH-ro-man-DIB-yoo-ler) joint, or the TMJ, connects the upper and lower jawbones. This joint allows the jaw to open wide and move back and forth when you chew, talk, or yawn.There are also several muscles that help this joint move. There can be muscle tightness and pain in the muscle that can cause several symptoms.  What causes TMJ pain? There are many causes of TMJ pain. Repeated chewing (for example, chewing gum) and clenching your teeth can cause pain in the joint. Some TMJ pain has no obvious cause. What can I do to ease the pain? There are many things you can do to help your pain get better. When you have pain:  Eat soft foods and stay away from chewy foods (for example, taffy) Try to use both sides of your mouth to chew Don't chew gum MASSAGE/heating pad Don't open your mouth wide (for example, during yawning or singing) Don't bite your cheeks or fingernails Lower your amount of stress and worry Applying a warm, damp washcloth to the joint may help. Over-the-counter pain medicines such as ibuprofen (one brand: Advil) or acetaminophen (one brand: Tylenol) might also help. Do not use these medicines if you are allergic to them or if your doctor told you not to use them. How can I stop the pain from coming back? When your pain is better, you can do these exercises to make your muscles stronger and to keep the pain from coming back:  Resisted mouth opening: Place your thumb or two fingers under your chin and open your mouth slowly, pushing up lightly on your chin with your thumb. Hold for three to six seconds. Close your mouth slowly. Resisted mouth closing: Place your thumbs under your chin and your two index fingers on the ridge between your mouth and the bottom of your chin. Push down lightly  on your chin as you close your mouth. Tongue up: Slowly open and close your mouth while keeping the tongue touching the roof of the mouth. Side-to-side jaw movement: Place an object about one fourth of an inch thick (for example, two tongue depressors) between your front teeth. Slowly move your jaw from side to side. Increase the thickness of the object as the exercise becomes easier Forward jaw movement: Place an object about one fourth of an inch thick between your front teeth and move the bottom jaw forward so that the bottom teeth are in front of the top teeth. Increase the thickness of the object as the exercise becomes easier. These exercises should not be painful. If it hurts to do these exercises, stop doing them and talk to your family doctor.     Preventative Care for Adults - Female      MAINTAIN REGULAR HEALTH EXAMS:  A routine yearly physical is a good way to check in with your primary care provider about your health and preventive screening. It is also an opportunity to share updates about your health and any concerns you have, and receive a thorough all-over exam.   Most health insurance companies pay for at least some preventative services.  Check with your health plan for specific coverages.  WHAT PREVENTATIVE SERVICES DO WOMEN NEED?  Adult women should have their weight and blood pressure checked regularly.   Women age 51 and older should have their cholesterol  levels checked regularly.  Women should be screened for cervical cancer with a Pap smear and pelvic exam beginning at either age 70, or 3 years after they become sexually activity.    Breast cancer screening generally begins at age 2 with a mammogram and breast exam by your primary care provider.    Beginning at age 34 and continuing to age 39, women should be screened for colorectal cancer.  Certain people may need continued testing until age 11.  Updating vaccinations is part of preventative care.   Vaccinations help protect against diseases such as the flu.  Osteoporosis is a disease in which the bones lose minerals and strength as we age. Women ages 61 and over should discuss this with their caregivers, as should women after menopause who have other risk factors.  Lab tests are generally done as part of preventative care to screen for anemia and blood disorders, to screen for problems with the kidneys and liver, to screen for bladder problems, to check blood sugar, and to check your cholesterol level.  Preventative services generally include counseling about diet, exercise, avoiding tobacco, drugs, excessive alcohol consumption, and sexually transmitted infections.    GENERAL RECOMMENDATIONS FOR GOOD HEALTH:  Healthy diet:  Eat a variety of foods, including fruit, vegetables, animal or vegetable protein, such as meat, fish, chicken, and eggs, or beans, lentils, tofu, and grains, such as rice.  Drink plenty of water daily.  Decrease saturated fat in the diet, avoid lots of red meat, processed foods, sweets, fast foods, and fried foods.  Exercise:  Aerobic exercise helps maintain good heart health. At least 30-40 minutes of moderate-intensity exercise is recommended. For example, a brisk walk that increases your heart rate and breathing. This should be done on most days of the week.   Find a type of exercise or a variety of exercises that you enjoy so that it becomes a part of your daily life.  Examples are running, walking, swimming, water aerobics, and biking.  For motivation and support, explore group exercise such as aerobic class, spin class, Zumba, Yoga,or  martial arts, etc.    Set exercise goals for yourself, such as a certain weight goal, walk or run in a race such as a 5k walk/run.  Speak to your primary care provider about exercise goals.  Disease prevention:  If you smoke or chew tobacco, find out from your caregiver how to quit. It can literally save your life, no matter  how long you have been a tobacco user. If you do not use tobacco, never begin.   Maintain a healthy diet and normal weight. Increased weight leads to problems with blood pressure and diabetes.   The Body Mass Index or BMI is a way of measuring how much of your body is fat. Having a BMI above 27 increases the risk of heart disease, diabetes, hypertension, stroke and other problems related to obesity. Your caregiver can help determine your BMI and based on it develop an exercise and dietary program to help you achieve or maintain this important measurement at a healthful level.  High blood pressure causes heart and blood vessel problems.  Persistent high blood pressure should be treated with medicine if weight loss and exercise do not work.   Fat and cholesterol leaves deposits in your arteries that can block them. This causes heart disease and vessel disease elsewhere in your body.  If your cholesterol is found to be high, or if you have heart disease or certain other medical  conditions, then you may need to have your cholesterol monitored frequently and be treated with medication.   Ask if you should have a cardiac stress test if your history suggests this. A stress test is a test done on a treadmill that looks for heart disease. This test can find disease prior to there being a problem.  Menopause can be associated with physical symptoms and risks. Hormone replacement therapy is available to decrease these. You should talk to your caregiver about whether starting or continuing to take hormones is right for you.   Osteoporosis is a disease in which the bones lose minerals and strength as we age. This can result in serious bone fractures. Risk of osteoporosis can be identified using a bone density scan. Women ages 71 and over should discuss this with their caregivers, as should women after menopause who have other risk factors. Ask your caregiver whether you should be taking a calcium supplement and  Vitamin D, to reduce the rate of osteoporosis.   Avoid drinking alcohol in excess (more than two drinks per day).  Avoid use of street drugs. Do not share needles with anyone. Ask for professional help if you need assistance or instructions on stopping the use of alcohol, cigarettes, and/or drugs.  Brush your teeth twice a day with fluoride toothpaste, and floss once a day. Good oral hygiene prevents tooth decay and gum disease. The problems can be painful, unattractive, and can cause other health problems. Visit your dentist for a routine oral and dental check up and preventive care every 6-12 months.   Look at your skin regularly.  Use a mirror to look at your back. Notify your caregivers of changes in moles, especially if there are changes in shapes, colors, a size larger than a pencil eraser, an irregular border, or development of new moles.  Safety:  Use seatbelts 100% of the time, whether driving or as a passenger.  Use safety devices such as hearing protection if you work in environments with loud noise or significant background noise.  Use safety glasses when doing any work that could send debris in to the eyes.  Use a helmet if you ride a bike or motorcycle.  Use appropriate safety gear for contact sports.  Talk to your caregiver about gun safety.  Use sunscreen with a SPF (or skin protection factor) of 15 or greater.  Lighter skinned people are at a greater risk of skin cancer. Don't forget to also wear sunglasses in order to protect your eyes from too much damaging sunlight. Damaging sunlight can accelerate cataract formation.   Practice safe sex. Use condoms. Condoms are used for birth control and to help reduce the spread of sexually transmitted infections (or STIs).  Some of the STIs are gonorrhea (the clap), chlamydia, syphilis, trichomonas, herpes, HPV (human papilloma virus) and HIV (human immunodeficiency virus) which causes AIDS. The herpes, HIV and HPV are viral illnesses that have  no cure. These can result in disability, cancer and death.   Keep carbon monoxide and smoke detectors in your home functioning at all times. Change the batteries every 6 months or use a model that plugs into the wall.   Vaccinations:  Stay up to date with your tetanus shots and other required immunizations. You should have a booster for tetanus every 10 years. Be sure to get your flu shot every year, since 5%-20% of the [place] population comes down with the flu. The flu vaccine changes each year, so being vaccinated once is not  enough. Get your shot in the fall, before the flu season peaks.   Other vaccines to consider:  Human Papilloma Virus or HPV causes cancer of the cervix, and other infections that can be transmitted from person to person. There is a vaccine for HPV, and females should get immunized between the ages of 6411 and 8926. It requires a series of 3 shots.   Pneumococcal vaccine to protect against certain types of pneumonia.  This is normally recommended for adults age 51 or older.  However, adults younger than 51 years old with certain underlying conditions such as diabetes, heart or lung disease should also receive the vaccine.  Shingles vaccine to protect against Varicella Zoster if you are older than age 51, or younger than 51 years old with certain underlying illness.  Hepatitis A vaccine to protect against a form of infection of the liver by a virus acquired from food.  Hepatitis B vaccine to protect against a form of infection of the liver by a virus acquired from blood or body fluids, particularly if you work in health care.  If you plan to travel internationally, check with your local health department for specific vaccination recommendations.  Cancer Screening:  Breast cancer screening is essential to preventive care for women. All women age 51 and older should perform a breast self-exam every month. At age 51 and older, women should have their caregiver complete a breast  exam each year. Women at ages 4640 and older should have a mammogram (x-ray film) of the breasts. Your caregiver can discuss how often you need mammograms.    Cervical cancer screening includes taking a Pap smear (sample of cells examined under a microscope) from the cervix (end of the uterus). It also includes testing for HPV (Human Papilloma Virus, which can cause cervical cancer). Screening and a pelvic exam should begin at age 51, or 3 years after a woman becomes sexually active. Screening should occur every year, with a Pap smear but no HPV testing, up to age 51. After age 51, you should have a Pap smear every 3 years with HPV testing, if no HPV was found previously.   Most routine colon cancer screening begins at the age of 51. On a yearly basis, doctors may provide special easy to use take-home tests to check for hidden blood in the stool. Sigmoidoscopy or colonoscopy can detect the earliest forms of colon cancer and is life saving. These tests use a small camera at the end of a tube to directly examine the colon. Speak to your caregiver about this at age 950, when routine screening begins (and is repeated every 5 years unless early forms of pre-cancerous polyps or small growths are found).

## 2014-02-09 LAB — HEPATITIS B CORE ANTIBODY, TOTAL: HEP B C TOTAL AB: NONREACTIVE

## 2014-02-09 LAB — MICROALBUMIN / CREATININE URINE RATIO
Creatinine, Urine: 205.6 mg/dL
MICROALB UR: 0.8 mg/dL (ref <2.0)
Microalb Creat Ratio: 3.9 mg/g (ref 0.0–30.0)

## 2014-02-09 LAB — URINALYSIS, ROUTINE W REFLEX MICROSCOPIC
Bilirubin Urine: NEGATIVE
Glucose, UA: NEGATIVE mg/dL
HGB URINE DIPSTICK: NEGATIVE
Ketones, ur: NEGATIVE mg/dL
LEUKOCYTES UA: NEGATIVE
Nitrite: NEGATIVE
PROTEIN: NEGATIVE mg/dL
Specific Gravity, Urine: 1.025 (ref 1.005–1.030)
UROBILINOGEN UA: 0.2 mg/dL (ref 0.0–1.0)
pH: 5 (ref 5.0–8.0)

## 2014-02-09 LAB — HEPATITIS A ANTIBODY, TOTAL: Hep A Total Ab: REACTIVE — AB

## 2014-02-09 LAB — HEMOGLOBIN A1C
HEMOGLOBIN A1C: 5.8 % — AB (ref <5.7)
MEAN PLASMA GLUCOSE: 120 mg/dL — AB (ref <117)

## 2014-02-09 LAB — URINALYSIS, MICROSCOPIC ONLY
Bacteria, UA: NONE SEEN
CRYSTALS: NONE SEEN
Casts: NONE SEEN

## 2014-02-09 LAB — HEPATITIS B SURFACE ANTIBODY,QUALITATIVE: Hep B S Ab: NEGATIVE

## 2014-02-09 LAB — HEPATITIS C ANTIBODY: HCV AB: NEGATIVE

## 2014-02-09 LAB — VITAMIN D 25 HYDROXY (VIT D DEFICIENCY, FRACTURES): VIT D 25 HYDROXY: 52 ng/mL (ref 30–100)

## 2014-02-09 LAB — INSULIN, FASTING: INSULIN FASTING, SERUM: 8.8 u[IU]/mL (ref 2.0–19.6)

## 2014-02-10 LAB — CYTOLOGY - PAP

## 2014-02-10 LAB — URINE CULTURE
Colony Count: NO GROWTH
ORGANISM ID, BACTERIA: NO GROWTH

## 2014-02-11 LAB — HEPATITIS B E ANTIBODY: HEPATITIS BE ANTIBODY: NONREACTIVE

## 2014-10-03 DIAGNOSIS — Z79899 Other long term (current) drug therapy: Secondary | ICD-10-CM | POA: Insufficient documentation

## 2014-10-03 DIAGNOSIS — E669 Obesity, unspecified: Secondary | ICD-10-CM | POA: Insufficient documentation

## 2014-10-03 DIAGNOSIS — IMO0001 Reserved for inherently not codable concepts without codable children: Secondary | ICD-10-CM | POA: Insufficient documentation

## 2014-10-03 DIAGNOSIS — R030 Elevated blood-pressure reading, without diagnosis of hypertension: Principal | ICD-10-CM

## 2014-10-03 NOTE — Progress Notes (Deleted)
Patient ID: Robin Montes, female   DOB: 1960/11/04, 54 y.o.   MRN: [id number]   This very nice 54 y.o.female presents for 3 month follow upto screen for elevated BP, Hyperlipidemia, Pre-Diabetes and Vitamin D Deficiency.    Patient is monitored expectantly for elenvated BP & BP has been controlled at home. Today's  . Patient has had no complaints of any cardiac type chest pain, palpitations, dyspnea/orthopnea/PND, dizziness, claudication, or dependent edema.   Hyperlipidemia is not controlled with diet.  Last Lipids were not  at goal -  Cholesterol 196; HDL 38*; LDL 128*; Triglycerides 150 on 02/08/2014.   Also, the patient has history of Morbid Obesity (BMI 30.71) and consequent PreDiabetes and has had no symptoms of reactive hypoglycemia, diabetic polys, paresthesias or visual blurring.  Last A1c was  5.8% on 02/08/2014.    Further, the patient also has history of Vitamin D Deficiency and supplements vitamin D without any suspected side-effects. Last vitamin D was  52 on 02/08/2014.     Medication Sig  . VITAMIN D  Take 5,000 Int'l Units by mouth daily.  . IRON  Take by mouth daily.  Marland Kitchen levothyroxine  50 MCG tablet Take 50 mcg by mouth daily before breakfast.   No Known Allergies  PMHx:   Past Medical History  Diagnosis Date  . Depression   . Hypothyroidism   . Migraine   . Hyperlipidemia   . Prediabetes   . Labile hypertension   . GERD (gastroesophageal reflux disease)   . Fibrocystic breast disease   . Cholelithiasis    Immunization History  Administered Date(s) Administered  . Influenza Split 02/08/2014  . Td 03/12/2002  . Tdap 02/08/2014   Past Surgical History  Procedure Laterality Date  . Thyroid ablation  2008   FHx:    Reviewed / unchanged  SHx:    Reviewed / unchanged  Systems Review:  Constitutional: Denies fever, chills, wt changes, headaches, insomnia, fatigue, night sweats, change in appetite. Eyes: Denies redness, blurred vision, diplopia, discharge,  itchy, watery eyes.  ENT: Denies discharge, congestion, post nasal drip, epistaxis, sore throat, earache, hearing loss, dental pain, tinnitus, vertigo, sinus pain, snoring.  CV: Denies chest pain, palpitations, irregular heartbeat, syncope, dyspnea, diaphoresis, orthopnea, PND, claudication or edema. Respiratory: denies cough, dyspnea, DOE, pleurisy, hoarseness, laryngitis, wheezing.  Gastrointestinal: Denies dysphagia, odynophagia, heartburn, reflux, water brash, abdominal pain or cramps, nausea, vomiting, bloating, diarrhea, constipation, hematemesis, melena, hematochezia  or hemorrhoids. Genitourinary: Denies dysuria, frequency, urgency, nocturia, hesitancy, discharge, hematuria or flank pain. Musculoskeletal: Denies arthralgias, myalgias, stiffness, jt. swelling, pain, limping or strain/sprain.  Skin: Denies pruritus, rash, hives, warts, acne, eczema or change in skin lesion(s). Neuro: No weakness, tremor, incoordination, spasms, paresthesia or pain. Psychiatric: Denies confusion, memory loss or sensory loss. Endo: Denies change in weight, skin or hair change.  Heme/Lymph: No excessive bleeding, bruising or enlarged lymph nodes.  Physical Exam  There were no vitals taken for this visit.  Appears well nourished and in no distress. Eyes: PERRLA, EOMs, conjunctiva no swelling or erythema. Sinuses: No frontal/maxillary tenderness ENT/Mouth: EAC's clear, TM's nl w/o erythema, bulging. Nares clear w/o erythema, swelling, exudates. Oropharynx clear without erythema or exudates. Oral hygiene is good. Tongue normal, non obstructing. Hearing intact.  Neck: Supple. Thyroid nl. Car 2+/2+ without bruits, nodes or JVD. Chest: Respirations nl with BS clear & equal w/o rales, rhonchi, wheezing or stridor.  Cor: Heart sounds normal w/ regular rate and rhythm without sig. murmurs, gallops, clicks, or rubs. Peripheral  pulses normal and equal  without edema.  Abdomen: Soft & bowel sounds normal. Non-tender  w/o guarding, rebound, hernias, masses, or organomegaly.  Lymphatics: Unremarkable.  Musculoskeletal: Full ROM all peripheral extremities, joint stability, 5/5 strength, and normal gait.  Skin: Warm, dry without exposed rashes, lesions or ecchymosis apparent.  Neuro: Cranial nerves intact, reflexes equal bilaterally. Sensory-motor testing grossly intact. Tendon reflexes grossly intact.  Pysch: Alert & oriented x 3.  Insight and judgement nl & appropriate. No ideations.  Assessment and Plan:   Recommended regular exercise, BP monitoring, weight control, and discussed med and SE's. Recommended labs to assess and monitor clinical status. Further disposition pending results of labs. Over 30 minutes of exam, counseling, chart review was performed

## 2014-10-03 NOTE — Patient Instructions (Addendum)
Can try melatonin -15 mg at night for sleep If this does not help we can try prescription medication.  Also here is some information about good sleep hygiene.   Insomnia Insomnia is frequent trouble falling and/or staying asleep. Insomnia can be a long term problem or a short term problem. Both are common. Insomnia can be a short term problem when the wakefulness is related to a certain stress or worry. Long term insomnia is often related to ongoing stress during waking hours and/or poor sleeping habits. Overtime, sleep deprivation itself can make the problem worse. Every little thing feels more severe because you are overtired and your ability to cope is decreased. CAUSES   Stress, anxiety, and depression.  Poor sleeping habits.  Distractions such as TV in the bedroom.  Naps close to bedtime.  Engaging in emotionally charged conversations before bed.  Technical reading before sleep.  Alcohol and other sedatives. They may make the problem worse. They can hurt normal sleep patterns and normal dream activity.  Stimulants such as caffeine for several hours prior to bedtime.  Pain syndromes and shortness of breath can cause insomnia.  Exercise late at night.  Changing time zones may cause sleeping problems (jet lag). It is sometimes helpful to have someone observe your sleeping patterns. They should look for periods of not breathing during the night (sleep apnea). They should also look to see how long those periods last. If you live alone or observers are uncertain, you can also be observed at a sleep clinic where your sleep patterns will be professionally monitored. Sleep apnea requires a checkup and treatment. Give your caregivers your medical history. Give your caregivers observations your family has made about your sleep.  SYMPTOMS   Not feeling rested in the morning.  Anxiety and restlessness at bedtime.  Difficulty falling and staying asleep. TREATMENT   Your caregiver  may prescribe treatment for an underlying medical disorders. Your caregiver can give advice or help if you are using alcohol or other drugs for self-medication. Treatment of underlying problems will usually eliminate insomnia problems.  Medications can be prescribed for short time use. They are generally not recommended for lengthy use.  Over-the-counter sleep medicines are not recommended for lengthy use. They can be habit forming.  You can promote easier sleeping by making lifestyle changes such as:  Using relaxation techniques that help with breathing and reduce muscle tension.  Exercising earlier in the day.  Changing your diet and the time of your last meal. No night time snacks.  Establish a regular time to go to bed.  Counseling can help with stressful problems and worry.  Soothing music and white noise may be helpful if there are background noises you cannot remove.  Stop tedious detailed work at least one hour before bedtime. HOME CARE INSTRUCTIONS   Keep a diary. Inform your caregiver about your progress. This includes any medication side effects. See your caregiver regularly. Take note of:  Times when you are asleep.  Times when you are awake during the night.  The quality of your sleep.  How you feel the next day. This information will help your caregiver care for you.  Get out of bed if you are still awake after 15 minutes. Read or do some quiet activity. Keep the lights down. Wait until you feel sleepy and go back to bed.  Keep regular sleeping and waking hours. Avoid naps.  Exercise regularly.  Avoid distractions at bedtime. Distractions include watching television or engaging in  any intense or detailed activity like attempting to balance the household checkbook.  Develop a bedtime ritual. Keep a familiar routine of bathing, brushing your teeth, climbing into bed at the same time each night, listening to soothing music. Routines increase the success of falling  to sleep faster.  Use relaxation techniques. This can be using breathing and muscle tension release routines. It can also include visualizing peaceful scenes. You can also help control troubling or intruding thoughts by keeping your mind occupied with boring or repetitive thoughts like the old concept of counting sheep. You can make it more creative like imagining planting one beautiful flower after another in your backyard garden.  During your day, work to eliminate stress. When this is not possible use some of the previous suggestions to help reduce the anxiety that accompanies stressful situations. MAKE SURE YOU:   Understand these instructions.  Will watch your condition.  Will get help right away if you are not doing well or get worse. Document Released: 02/24/2000 Document Revised: 05/21/2011 Document Reviewed: 03/26/2007 [facility] Patient Information 2015 [place], [place]. This information is not intended to replace advice given to you by your health care provider. Make sure you discuss any questions you have with your health care provider.   Recommend Adult Low dose Aspirin or coated  Aspirin 81 mg daily   To reduce risk of Colon Cancer 20 %,   Skin Cancer 26 % ,   Melanoma 46%   and   Pancreatic cancer 60% ++++++++++++++++++ Vitamin D goal is between 70-100.   Please make sure that you are taking your Vitamin D as directed.   It is very important as a natural anti-inflammatory   helping hair, skin, and nails, as well as reducing stroke and heart attack risk.   It helps your bones and helps with mood.  It also decreases numerous cancer risks so please take it as directed.   Low Vit D is associated with a 200-300% higher risk for CANCER   and 200-300% higher risk for HEART   ATTACK  &  STROKE.   .....................................Marland Kitchen  It is also associated with higher death rate at younger ages,   autoimmune diseases like Rheumatoid arthritis, Lupus, Multiple  Sclerosis.     Also many other serious conditions, like depression, Alzheimer's  Dementia, infertility, muscle aches, fatigue, fibromyalgia - just to name a few.  +++++++++++++++++++  Recommend the book "The END of DIETING" by Dr Monico Hoar   & the book "The END of DIABETES " by Dr Monico Hoar  At [email or web address] - get book & Audio CD's     Being diabetic has a  300% increased risk for heart attack, stroke, cancer, and alzheimer- type vascular dementia. It is very important that you work harder with diet by avoiding all foods that are white. Avoid white rice (brown & wild rice is OK), white potatoes (sweetpotatoes in moderation is OK), White bread or wheat bread or anything made out of white flour like bagels, donuts, rolls, buns, biscuits, cakes, pastries, cookies, pizza crust, and pasta (made from white flour & egg whites) - vegetarian pasta or spinach or wheat pasta is OK. Multigrain breads like Arnold's or Pepperidge Farm, or multigrain sandwich thins or flatbreads.  Diet, exercise and weight loss can reverse and cure diabetes in the early stages.  Diet, exercise and weight loss is very important in the control and prevention of complications of diabetes which affects every system in your body, ie. Brain - dementia/stroke, eyes -  glaucoma/blindness, heart - heart attack/heart failure, kidneys - dialysis, stomach - gastric paralysis, intestines - malabsorption, nerves - severe painful neuritis, circulation - gangrene & loss of a leg(s), and finally cancer and Alzheimers.    I recommend avoid fried & greasy foods,  sweets/candy, white rice (brown or wild rice or Quinoa is OK), white potatoes (sweet potatoes are OK) - anything made from white flour - bagels, doughnuts, rolls, buns, biscuits,white and wheat breads, pizza crust and traditional pasta made of white flour & egg white(vegetarian pasta or spinach or wheat pasta is OK).  Multi-grain bread is OK - like multi-grain flat bread or sandwich thins.  Avoid alcohol in excess. Exercise is also important.    Eat all the vegetables you want - avoid meat, especially red meat and dairy - especially cheese.  Cheese is the most concentrated form of trans-fats which is the worst thing to clog up our arteries. Veggie cheese is OK which can be found in the fresh produce section at [facility] or [employer] or [employer]  ++++++++++++++++++++++++++

## 2014-10-04 ENCOUNTER — Encounter: Payer: Self-pay | Admitting: Physician Assistant

## 2014-10-04 ENCOUNTER — Ambulatory Visit (INDEPENDENT_AMBULATORY_CARE_PROVIDER_SITE_OTHER): Payer: Commercial Managed Care - HMO | Admitting: Physician Assistant

## 2014-10-04 VITALS — BP 110/72 | HR 72 | Temp 98.2°F | Resp 18 | Ht 64.0 in | Wt 191.0 lb

## 2014-10-04 DIAGNOSIS — R030 Elevated blood-pressure reading, without diagnosis of hypertension: Secondary | ICD-10-CM

## 2014-10-04 DIAGNOSIS — R351 Nocturia: Secondary | ICD-10-CM

## 2014-10-04 DIAGNOSIS — Z6830 Body mass index (BMI) 30.0-30.9, adult: Secondary | ICD-10-CM

## 2014-10-04 DIAGNOSIS — K219 Gastro-esophageal reflux disease without esophagitis: Secondary | ICD-10-CM

## 2014-10-04 DIAGNOSIS — E559 Vitamin D deficiency, unspecified: Secondary | ICD-10-CM

## 2014-10-04 DIAGNOSIS — R7303 Prediabetes: Secondary | ICD-10-CM

## 2014-10-04 DIAGNOSIS — F329 Major depressive disorder, single episode, unspecified: Secondary | ICD-10-CM

## 2014-10-04 DIAGNOSIS — Z79899 Other long term (current) drug therapy: Secondary | ICD-10-CM

## 2014-10-04 DIAGNOSIS — R7309 Other abnormal glucose: Secondary | ICD-10-CM

## 2014-10-04 DIAGNOSIS — E039 Hypothyroidism, unspecified: Secondary | ICD-10-CM

## 2014-10-04 DIAGNOSIS — F32A Depression, unspecified: Secondary | ICD-10-CM

## 2014-10-04 DIAGNOSIS — IMO0001 Reserved for inherently not codable concepts without codable children: Secondary | ICD-10-CM

## 2014-10-04 DIAGNOSIS — E785 Hyperlipidemia, unspecified: Secondary | ICD-10-CM

## 2014-10-04 LAB — HEPATIC FUNCTION PANEL
ALT: 39 U/L — ABNORMAL HIGH (ref 6–29)
AST: 27 U/L (ref 10–35)
Albumin: 4.8 g/dL (ref 3.6–5.1)
Alkaline Phosphatase: 76 U/L (ref 33–130)
Bilirubin, Direct: 0.1 mg/dL (ref <=0.2)
Indirect Bilirubin: 0.5 mg/dL (ref 0.2–1.2)
Total Bilirubin: 0.6 mg/dL (ref 0.2–1.2)
Total Protein: 7.1 g/dL (ref 6.1–8.1)

## 2014-10-04 LAB — CBC WITH DIFFERENTIAL/PLATELET
BASOS ABS: 0 10*3/uL (ref 0.0–0.1)
Basophils Relative: 0 % (ref 0–1)
Eosinophils Absolute: 0.1 10*3/uL (ref 0.0–0.7)
Eosinophils Relative: 1 % (ref 0–5)
HEMATOCRIT: 38.7 % (ref 36.0–46.0)
HEMOGLOBIN: 13.3 g/dL (ref 12.0–15.0)
LYMPHS ABS: 2.4 10*3/uL (ref 0.7–4.0)
Lymphocytes Relative: 30 % (ref 12–46)
MCH: 31.1 pg (ref 26.0–34.0)
MCHC: 34.4 g/dL (ref 30.0–36.0)
MCV: 90.6 fL (ref 78.0–100.0)
MONO ABS: 0.5 10*3/uL (ref 0.1–1.0)
MPV: 10.6 fL (ref 8.6–12.4)
Monocytes Relative: 6 % (ref 3–12)
Neutro Abs: 5 10*3/uL (ref 1.7–7.7)
Neutrophils Relative %: 63 % (ref 43–77)
Platelets: 222 10*3/uL (ref 150–400)
RBC: 4.27 MIL/uL (ref 3.87–5.11)
RDW: 13.6 % (ref 11.5–15.5)
WBC: 7.9 10*3/uL (ref 4.0–10.5)

## 2014-10-04 LAB — LIPID PANEL
CHOL/HDL RATIO: 4.6 ratio (ref <=5.0)
Cholesterol: 192 mg/dL (ref 125–200)
HDL: 42 mg/dL — AB (ref >=46)
LDL CALC: 114 mg/dL (ref <130)
Triglycerides: 180 mg/dL — ABNORMAL HIGH (ref <150)
VLDL: 36 mg/dL — ABNORMAL HIGH (ref <30)

## 2014-10-04 LAB — BASIC METABOLIC PANEL WITH GFR
BUN: 15 mg/dL (ref 7–25)
CALCIUM: 9.3 mg/dL (ref 8.6–10.4)
CO2: 22 mmol/L (ref 20–31)
Chloride: 104 mmol/L (ref 98–110)
Creat: 0.6 mg/dL (ref 0.50–1.05)
GFR, Est African American: >89 mL/min (ref >=60)
GFR, Est Non African American: >89 mL/min (ref >=60)
Glucose, Bld: 97 mg/dL (ref 65–99)
Potassium: 4 mmol/L (ref 3.5–5.3)
SODIUM: 138 mmol/L (ref 135–146)

## 2014-10-04 LAB — TSH: TSH: 14.601 u[IU]/mL — ABNORMAL HIGH (ref 0.350–4.500)

## 2014-10-04 LAB — MAGNESIUM: MAGNESIUM: 1.8 mg/dL (ref 1.5–2.5)

## 2014-10-04 MED ORDER — BUPROPION HCL ER (XL) 150 MG PO TB24: 150.0000 mg | ORAL_TABLET | ORAL | Status: DC

## 2014-10-04 NOTE — Addendum Note (Signed)
Addended by: [name] R on: 10/04/2014 09:46 AM   Modules accepted: Orders

## 2014-10-04 NOTE — Progress Notes (Addendum)
Assessment and Plan:  1. Hypertension -Continue medication, monitor blood pressure at home. Continue DASH diet.  Reminder to go to the ER if any CP, SOB, nausea, dizziness, severe HA, changes vision/speech, left arm numbness and tingling and jaw pain.  2. Cholesterol -Continue diet and exercise. Check cholesterol.   3. Prediabetes  -Continue diet and exercise. Check A1C  4. Vitamin D Def - check level and continue medications.   5. Obesity with co morbidities - long discussion about weight loss, diet, and exercise  6. Hypothyroidism -check TSH level, continue medications the same, reminded to take on an empty stomach 30-3mins before food.   7. Depression - start welbutrin once a day, stress management techniques discussed, increase water, good sleep hygiene discussed, increase exercise, and increase veggies.   8. Nocturia Check urine, stop caffeine, stop drinking fluids after 8-9  Continue diet and meds as discussed. Further disposition pending results of labs. Over 30 minutes of exam, counseling, chart review, and critical decision making was performed  HPI 54 y.o. female  presents for 3 month follow up on hypertension, cholesterol, prediabetes, and vitamin D deficiency.   Her blood pressure has been controlled at home, today their BP is BP: 110/72 mmHg  She does workout. She denies chest pain, shortness of breath, dizziness.  She is not on cholesterol medication and denies myalgias. Her cholesterol is not at goal. The cholesterol last visit was:   Lab Results  Component Value Date   CHOL 196 02/08/2014   HDL 38* 02/08/2014   LDLCALC 128* 02/08/2014   TRIG 150* 02/08/2014   CHOLHDL 5.2 02/08/2014    She has been working on diet and exercise for prediabetes, and denies paresthesia of the feet, polydipsia, polyuria and visual disturbances. Last A1C in the office was:  Lab Results  Component Value Date   HGBA1C 5.8* 02/08/2014   Patient is on Vitamin D supplement.   Lab  Results  Component Value Date   VD25OH 52 02/08/2014     BMI is Body mass index is 32.77 kg/(m^2)., she is working on diet and exercise. Wt Readings from Last 3 Encounters:  10/04/14 191 lb (86.637 kg)  02/08/14 179 lb (81.194 kg)  10/27/13 186 lb (84.369 kg)   She is on thyroid medication. Her medication was changed last visit, she follows with Dr. Horald Pollen for her thyroid.  Lab Results  Component Value Date   TSH 0.320* 02/08/2014   She complains of fatigue in the morning, she does not sleep well. She gets up nocturia x 3-4 times a night, some frequency during the day but no dysuria.  She is drinking soda and juice.  She also admits to being isolated at home, she has some depression, not wanting to do as much as she use to, and has had some weight gain.   Current Medications:  Current Outpatient Prescriptions on File Prior to Visit  Medication Sig Dispense Refill  . Cholecalciferol (VITAMIN D PO) Take 5,000 Int'l Units by mouth daily.    . IRON PO Take by mouth daily.    Marland Kitchen levothyroxine (SYNTHROID, LEVOTHROID) 50 MCG tablet Take 50 mcg by mouth daily before breakfast.     No current facility-administered medications on file prior to visit.   Medical History:  Past Medical History  Diagnosis Date  . Depression   . Hypothyroidism   . Migraine   . Hyperlipidemia   . Prediabetes   . Labile hypertension   . GERD (gastroesophageal reflux disease)   .  Fibrocystic breast disease   . Cholelithiasis    Allergies: No Known Allergies   Review of Systems:  Review of Systems  Constitutional: Positive for malaise/fatigue. Negative for fever, chills, weight loss and diaphoresis.  HENT: Negative.        + snoring  Eyes: Negative.   Respiratory: Negative.   Cardiovascular: Negative.   Gastrointestinal: Negative.   Genitourinary: Positive for frequency. Negative for dysuria, urgency, hematuria and flank pain.  Musculoskeletal: Negative.   Skin: Negative.   Neurological: Negative.   Negative for weakness.  Endo/Heme/Allergies: Negative.   Psychiatric/Behavioral: Positive for depression. Negative for suicidal ideas, hallucinations, memory loss and substance abuse. The patient has insomnia. The patient is not nervous/anxious.     Family history- Review and unchanged Social history- Review and unchanged Physical Exam: BP 110/72 mmHg  Pulse 72  Temp(Src) 98.2 F (36.8 C) (Temporal)  Resp 18  Ht  (1.626 m)  Wt 191 lb (86.637 kg)  BMI 32.77 kg/m2 Wt Readings from Last 3 Encounters:  10/04/14 191 lb (86.637 kg)  02/08/14 179 lb (81.194 kg)  10/27/13 186 lb (84.369 kg)   General Appearance: Well nourished, in no apparent distress. Eyes: PERRLA, EOMs, conjunctiva no swelling or erythema Sinuses: No Frontal/maxillary tenderness ENT/Mouth: Ext aud canals clear, TMs without erythema, bulging. No erythema, swelling, or exudate on post pharynx.  Tonsils not swollen or erythematous. Hearing normal.  Neck: Supple, thyroid normal.  Respiratory: Respiratory effort normal, BS equal bilaterally without rales, rhonchi, wheezing or stridor.  Cardio: RRR with no MRGs. Brisk peripheral pulses without edema.  Abdomen: Soft, + BS,  Non tender, no guarding, rebound, hernias, masses. Lymphatics: Non tender without lymphadenopathy.  Musculoskeletal: Full ROM, 5/5 strength, Normal gait Skin: Warm, dry without rashes, lesions, ecchymosis.  Neuro: Cranial nerves intact. Normal muscle tone, no cerebellar symptoms. Psych: Awake and oriented X 3, normal affect, Insight and Judgment appropriate.    [name], PA-C 9:14 AM [facility]

## 2014-10-05 LAB — URINE CULTURE
COLONY COUNT: NO GROWTH
Organism ID, Bacteria: NO GROWTH

## 2014-10-05 LAB — HEMOGLOBIN A1C
Hgb A1c MFr Bld: 6 % — ABNORMAL HIGH
Mean Plasma Glucose: 126 mg/dL — ABNORMAL HIGH

## 2014-10-05 LAB — URINALYSIS, MICROSCOPIC ONLY
Casts: NONE SEEN
Crystals: NONE SEEN

## 2014-10-05 LAB — URINALYSIS, ROUTINE W REFLEX MICROSCOPIC
Bilirubin Urine: NEGATIVE
Glucose, UA: NEGATIVE mg/dL
Hgb urine dipstick: NEGATIVE
Ketones, ur: NEGATIVE mg/dL
Leukocytes, UA: NEGATIVE
Nitrite: NEGATIVE
Protein, ur: NEGATIVE mg/dL
Specific Gravity, Urine: 1.029 (ref 1.005–1.030)
Urobilinogen, UA: 0.2 mg/dL (ref 0.0–1.0)
pH: 5 (ref 5.0–8.0)

## 2014-10-05 LAB — VITAMIN D 25 HYDROXY (VIT D DEFICIENCY, FRACTURES): VIT D 25 HYDROXY: 31 ng/mL (ref 30–100)

## 2014-10-05 LAB — INSULIN, RANDOM: Insulin: 8.2 u[IU]/mL (ref 2.0–19.6)

## 2014-11-19 ENCOUNTER — Ambulatory Visit: Payer: Self-pay | Admitting: Physician Assistant

## 2015-01-10 ENCOUNTER — Ambulatory Visit: Payer: Self-pay | Admitting: Internal Medicine

## 2015-02-09 ENCOUNTER — Encounter: Payer: Self-pay | Admitting: Physician Assistant

## 2015-02-14 ENCOUNTER — Ambulatory Visit: Payer: Self-pay | Admitting: Internal Medicine

## 2015-02-21 ENCOUNTER — Ambulatory Visit (INDEPENDENT_AMBULATORY_CARE_PROVIDER_SITE_OTHER): Payer: Commercial Managed Care - HMO | Admitting: Physician Assistant

## 2015-02-21 ENCOUNTER — Encounter: Payer: Self-pay | Admitting: Physician Assistant

## 2015-02-21 VITALS — BP 110/64 | HR 88 | Temp 97.3°F | Resp 16 | Ht 66.0 in | Wt 184.0 lb

## 2015-02-21 DIAGNOSIS — E785 Hyperlipidemia, unspecified: Secondary | ICD-10-CM

## 2015-02-21 DIAGNOSIS — Z1211 Encounter for screening for malignant neoplasm of colon: Secondary | ICD-10-CM

## 2015-02-21 DIAGNOSIS — IMO0001 Reserved for inherently not codable concepts without codable children: Secondary | ICD-10-CM

## 2015-02-21 DIAGNOSIS — E039 Hypothyroidism, unspecified: Secondary | ICD-10-CM

## 2015-02-21 DIAGNOSIS — Z0001 Encounter for general adult medical examination with abnormal findings: Secondary | ICD-10-CM

## 2015-02-21 DIAGNOSIS — K219 Gastro-esophageal reflux disease without esophagitis: Secondary | ICD-10-CM

## 2015-02-21 DIAGNOSIS — Z0000 Encounter for general adult medical examination without abnormal findings: Secondary | ICD-10-CM | POA: Diagnosis not present

## 2015-02-21 DIAGNOSIS — E559 Vitamin D deficiency, unspecified: Secondary | ICD-10-CM

## 2015-02-21 DIAGNOSIS — R7303 Prediabetes: Secondary | ICD-10-CM

## 2015-02-21 DIAGNOSIS — F325 Major depressive disorder, single episode, in full remission: Secondary | ICD-10-CM

## 2015-02-21 DIAGNOSIS — Z79899 Other long term (current) drug therapy: Secondary | ICD-10-CM

## 2015-02-21 DIAGNOSIS — R030 Elevated blood-pressure reading, without diagnosis of hypertension: Secondary | ICD-10-CM

## 2015-02-21 DIAGNOSIS — Z23 Encounter for immunization: Secondary | ICD-10-CM | POA: Diagnosis not present

## 2015-02-21 DIAGNOSIS — I10 Essential (primary) hypertension: Secondary | ICD-10-CM

## 2015-02-21 NOTE — Patient Instructions (Addendum)
SCHEDULE YOUR MAMMOGRAM  [facility]  7 a.m.-6:30 p.m., Monday 7 a.m.-5 p.m., Tuesday-Friday Schedule an appointment by calling [phone].   Costochondritis Costochondritis, sometimes called Tietze syndrome, is a swelling and irritation (inflammation) of the tissue (cartilage) that connects your ribs with your breastbone (sternum). It causes pain in the chest and rib area. Costochondritis usually goes away on its own over time. It can take up to 6 weeks or longer to get better, especially if you are unable to limit your activities. CAUSES  Some cases of costochondritis have no known cause. Possible causes include:  Injury (trauma).  Exercise or activity such as lifting.  Severe coughing. SIGNS AND SYMPTOMS  Pain and tenderness in the chest and rib area.  Pain that gets worse when coughing or taking deep breaths.  Pain that gets worse with specific movements. DIAGNOSIS  Your health care provider will do a physical exam and ask about your symptoms. Chest X-rays or other tests may be done to rule out other problems. TREATMENT  Costochondritis usually goes away on its own over time. Your health care provider may prescribe medicine to help relieve pain. HOME CARE INSTRUCTIONS   Avoid exhausting physical activity. Try not to strain your ribs during normal activity. This would include any activities using chest, abdominal, and side muscles, especially if heavy weights are used.  Apply ice to the affected area for the first 2 days after the pain begins.  Put ice in a plastic bag.  Place a towel between your skin and the bag.  Leave the ice on for 20 minutes, 2-3 times a day.  Only take over-the-counter or prescription medicines as directed by your health care provider. SEEK MEDICAL CARE IF:  You have redness or swelling at the rib joints. These are signs of infection.  Your pain does not go away despite rest or medicine. SEEK IMMEDIATE MEDICAL  CARE IF:   Your pain increases or you are very uncomfortable.  You have shortness of breath or difficulty breathing.  You cough up blood.  You have worse chest pains, sweating, or vomiting.  You have a fever or persistent symptoms for more than 2-3 days.  You have a fever and your symptoms suddenly get worse. MAKE SURE YOU:   Understand these instructions.  Will watch your condition.  Will get help right away if you are not doing well or get worse.   This information is not intended to replace advice given to you by your health care provider. Make sure you discuss any questions you have with your health care provider.   Document Released: 12/06/2004 Document Revised: 12/17/2012 Document Reviewed: 09/30/2012 Elsevier Interactive Patient Education 2016 [employer]   We want weight loss that will last so you should lose 1-2 pounds a week.  THAT IS IT! Please pick THREE things a month to change. Once it is a habit check off the item. Then pick another three items off the list to become habits.  If you are already doing a habit on the list GREAT!  Cross that item off! o Don't drink your calories. Ie, alcohol, soda, fruit juice, and sweet tea.  o Drink more water. Drink a glass when you feel hungry or before each meal.  o Eat breakfast - Complex carb and protein (likeDannon light and fit yogurt, oatmeal, fruit, eggs, [place]key bacon). o Measure your cereal.  Eat no more than one cup a day. (ie [place]ashi) o Eat an apple a day. o  Add a vegetable a day. o Try a new vegetable a month. o Use Pam! Stop using oil or butter to cook. o Don't finish your plate or use smaller plates. o Share your dessert. o Eat sugar free Jello for dessert or frozen grapes. o Don't eat 2-3 hours before bed. o Switch to whole wheat bread, pasta, and brown rice. o Make healthier choices when you eat out. No fries! o Pick baked chicken, NOT fried. o Don't forget to SLOW DOWN when you eat. It is not going anywhere.   o Take the stairs. o Park far away in the parking lot o [employer] (or weights) for 10 minutes while watching TV. o Walk at work for 10 minutes during break. o Walk outside 1 time a week with your friend, kids, dog, or significant other. o Start a walking group at church. o Walk the mall as much as you can tolerate.  o Keep a food diary. o Weigh yourself daily. o Walk for 15 minutes 3 days per week. o Cook at home more often and eat out less.  If life happens and you go back to old habits, it is okay.  Just start over. You can do it!   If you experience chest pain, get short of breath, or tired during the exercise, please stop immediately and inform your doctor.

## 2015-02-21 NOTE — Progress Notes (Signed)
Complete Physical  Assessment and Plan: 1. Prediabetes Discussed general issues about diabetes pathophysiology and management., Educational material distributed., Suggested low cholesterol diet., Encouraged aerobic exercise., Discussed foot care., Reminded to get yearly retinal exam. - EKG 12-Lead  2. Hypothyroidism, unspecified hypothyroidism type Hypothyroidism-check TSH level, continue medications the same, reminded to take on an empty stomach 30-460mins before food.  Will send labs to Dr. Horald PollenBalen  3. Hyperlipidemia -continue medications, check lipids, decrease fatty foods, increase activity.  - EKG 12-Lead - US, RETROPERITNL ABD,  LTD  4. Vitamin D deficiency Check labs  5. Depression Depression/Anxiety- continue medications, stress management techniques discussed, increase water, good sleep hygiene discussed, increase exercise, and increase veggies.   6. Encounter for general adult medical examination with abnormal findings - CBC with Differential - BASIC METABOLIC PANEL WITH GFR - Hepatic function panel - Lipid panel - TSH - Hemoglobin A1c - Insulin, fasting - Vit D  25 hydroxy (rtn osteoporosis monitoring) - Urinalysis, Routine w reflex microscopic - Microalbumin / creatinine urine ratio - Vitamin B12 - Magnesium - Urine culture - Iron and TIBC - Ferritin  7. Essential hypertension - EKG - continue medications, DASH diet, exercise and monitor at home. Call if greater than 130/80.   8. Need for prophylactic vaccination and inoculation against influenza - Flu vaccine greater than or equal to 3yo with preservative IM  9. Need MGM  10. Needs to schedule colonoscopy  Discussed med's effects and SE's. Screening labs and tests as requested with regular follow-up as recommended.  HPI  54 y.o. female  presents for a complete physical.   Her blood pressure has been controlled at home, today their BP is BP: 110/64 mmHg She does workout. She denies chest pain,  shortness of breath, dizziness.  She is not on cholesterol medication and denies myalgias. Her cholesterol is at goal. The cholesterol last visit was:   Lab Results  Component Value Date   CHOL 192 10/04/2014   HDL 42* 10/04/2014   LDLCALC 114 10/04/2014   TRIG 180* 10/04/2014   CHOLHDL 4.6 10/04/2014    She has been working on diet and exercise for prediabetes, and denies polydipsia, polyuria and visual disturbances. Last A1C in the office was:  Lab Results  Component Value Date   HGBA1C 6.0* 10/04/2014   Patient is on Vitamin D supplement.   Lab Results  Component Value Date   VD25OH 31 10/04/2014     She is on thyroid medication, s/p RAI therapy for graves disease in 2008. Her medication was changed last visit, she follows with Dr. Talmage NapBalan. Patient denies fatigue, weight changes, heat/cold intolerance, bowel/skin changes or CVS symptoms  Lab Results  Component Value Date   TSH 14.601* 10/04/2014   Her sister passed a month ago due to ICH  She has been working a lot outside has been lifting heavy objects and has had substernal chest pain, has taken ibuprofen that has helped.  BMI is Body mass index is 29.71 kg/(m^2)., she is working on diet and exercise.She has started to drink sodas again.  Wt Readings from Last 3 Encounters:  02/21/15 184 lb (83.462 kg)  10/04/14 191 lb (86.637 kg)  02/08/14 179 lb (81.194 kg)      Current Medications:  Current Outpatient Prescriptions on File Prior to Visit  Medication Sig Dispense Refill  . buPROPion (WELLBUTRIN XL) 150 MG 24 hr tablet Take 1 tablet (150 mg total) by mouth every morning. 30 tablet 2  . Cholecalciferol (VITAMIN D PO) Take  5,000 Int'l Units by mouth daily.    . IRON PO Take by mouth daily.    Marland Kitchen levothyroxine (SYNTHROID, LEVOTHROID) 50 MCG tablet Take 50 mcg by mouth daily before breakfast.    . OVER THE COUNTER MEDICATION daily. Potassium- pt unsure of dose     No current facility-administered medications on file prior  to visit.   Health Maintenance:   Immunization History  Administered Date(s) Administered  . Influenza Split 02/08/2014  . Td 03/12/2002  . Tdap 02/08/2014   Tetanus: 2015  Pneumovax: Flu vaccine: 2016 Zostavax: LMP:09/2013 Pap: 2015 due 3 years MGM: 2013 DEXA: N/a Colonoscopy:2005 due 2015 was with medoff EGD: N/A Neg MRA head 01/2011  Dr. Ferdinand Cava  Patient Care Team: Lucky Cowboy, MD as PCP - General (Internal Medicine) Dorisann Frames, MD as Consulting Physician (Endocrinology)  Allergies: No Known Allergies Medical History:  Past Medical History  Diagnosis Date  . Depression   . Hypothyroidism   . Migraine   . Hyperlipidemia   . Prediabetes   . Labile hypertension   . GERD (gastroesophageal reflux disease)   . Fibrocystic breast disease   . Cholelithiasis    Surgical History:  Past Surgical History  Procedure Laterality Date  . Thyroid ablation  2008   Family History:  Family History  Problem Relation Age of Onset  . Hypertension Mother   . Hyperlipidemia Father   . Aneurysm Sister     brain  . Heart attack Paternal Grandfather 7   Social History:  Social History  Substance Use Topics  . Smoking status: Never Smoker   . Smokeless tobacco: Never Used  . Alcohol Use: 0.0 oz/week    0 Standard drinks or equivalent per week     Comment: rare   Review of Systems  Constitutional: Negative.   HENT: Negative.   Respiratory: Negative.  Negative for shortness of breath.   Cardiovascular: Positive for chest pain. Negative for palpitations, orthopnea, claudication, leg swelling and PND.  Gastrointestinal: Negative.   Genitourinary: Negative.   Musculoskeletal: Negative.   Skin: Negative.   Neurological: Negative.   Psychiatric/Behavioral: Negative.      Physical Exam: Estimated body mass index is 29.71 kg/(m^2) as calculated from the following:   Height as of this encounter:  (1.676 m).   Weight as of this encounter: 184 lb (83.462  kg). BP 110/64 mmHg  Pulse 88  Temp(Src) 97.3 F (36.3 C) (Temporal)  Resp 16  Ht  (1.676 m)  Wt 184 lb (83.462 kg)  BMI 29.71 kg/m2  SpO2 99% General Appearance: Well nourished, in no apparent distress.  Eyes: PERRLA, EOMs, conjunctiva no swelling or erythema, normal fundi and vessels.  Sinuses: No Frontal/maxillary tenderness  ENT/Mouth: Ext aud canals clear, normal light reflex with TMs without erythema, bulging. Good dentition. No erythema, swelling, or exudate on post pharynx. Tonsils not swollen or erythematous. Hearing normal.  Neck: Supple, thyroid normal. No bruits  Respiratory: Respiratory effort normal, BS equal bilaterally without rales, rhonchi, wheezing or stridor.  Cardio: RRR without murmurs, rubs or gallops. Brisk peripheral pulses without edema.  Chest: Tender to palpation left chest, symmetric, with normal excursions and percussion.  Breasts: Symmetric, without lumps, nipple discharge, retractions.  Abdomen: Soft, nontender, no guarding, rebound, hernias, masses, or organomegaly. .  Lymphatics: Non tender without lymphadenopathy.  Genitourinary: defer Musculoskeletal: Full ROM all peripheral extremities,5/5 strength, and normal gait.  Skin: Warm, dry without rashes, lesions, ecchymosis. Neuro: Cranial nerves intact, reflexes equal bilaterally. Normal muscle  tone, no cerebellar symptoms. Sensation intact.  Psych: Awake and oriented X 3, normal affect, Insight and Judgment appropriate.   EKG: WNL no changes. AORTA SCAN: defer   [name] 10:07 AM [facility]

## 2015-02-22 LAB — CBC WITH DIFFERENTIAL/PLATELET
BASOS ABS: 0 10*3/uL (ref 0.0–0.1)
Basophils Relative: 0 % (ref 0–1)
EOS ABS: 0.1 10*3/uL (ref 0.0–0.7)
EOS PCT: 2 % (ref 0–5)
HCT: 41.2 % (ref 36.0–46.0)
Hemoglobin: 14.1 g/dL (ref 12.0–15.0)
Lymphocytes Relative: 33 % (ref 12–46)
Lymphs Abs: 2.3 10*3/uL (ref 0.7–4.0)
MCH: 31.1 pg (ref 26.0–34.0)
MCHC: 34.2 g/dL (ref 30.0–36.0)
MCV: 90.9 fL (ref 78.0–100.0)
MPV: 10.8 fL (ref 8.6–12.4)
Monocytes Absolute: 0.3 10*3/uL (ref 0.1–1.0)
Monocytes Relative: 4 % (ref 3–12)
Neutro Abs: 4.3 10*3/uL (ref 1.7–7.7)
Neutrophils Relative %: 61 % (ref 43–77)
PLATELETS: 237 10*3/uL (ref 150–400)
RBC: 4.53 MIL/uL (ref 3.87–5.11)
RDW: 13.2 % (ref 11.5–15.5)
WBC: 7.1 10*3/uL (ref 4.0–10.5)

## 2015-02-22 LAB — BASIC METABOLIC PANEL WITH GFR
BUN: 12 mg/dL (ref 7–25)
CO2: 25 mmol/L (ref 20–31)
Calcium: 9.9 mg/dL (ref 8.6–10.4)
Chloride: 104 mmol/L (ref 98–110)
Creat: 0.67 mg/dL (ref 0.50–1.05)
Glucose, Bld: 93 mg/dL (ref 65–99)
POTASSIUM: 4.5 mmol/L (ref 3.5–5.3)
Sodium: 140 mmol/L (ref 135–146)

## 2015-02-22 LAB — URINALYSIS, ROUTINE W REFLEX MICROSCOPIC
BILIRUBIN URINE: NEGATIVE
GLUCOSE, UA: NEGATIVE
HGB URINE DIPSTICK: NEGATIVE
Ketones, ur: NEGATIVE
Leukocytes, UA: NEGATIVE
Nitrite: NEGATIVE
PROTEIN: NEGATIVE
Specific Gravity, Urine: 1.023 (ref 1.001–1.035)
pH: 5 (ref 5.0–8.0)

## 2015-02-22 LAB — MICROALBUMIN / CREATININE URINE RATIO
Creatinine, Urine: 195 mg/dL (ref 20–320)
Microalb Creat Ratio: 5 mcg/mg creat (ref <30)
Microalb, Ur: 1 mg/dL

## 2015-02-22 LAB — LIPID PANEL
CHOLESTEROL: 217 mg/dL — AB (ref 125–200)
HDL: 40 mg/dL — AB (ref >=46)
LDL CALC: 139 mg/dL — AB (ref <130)
TRIGLYCERIDES: 189 mg/dL — AB (ref <150)
Total CHOL/HDL Ratio: 5.4 Ratio — ABNORMAL HIGH (ref <=5.0)
VLDL: 38 mg/dL — ABNORMAL HIGH (ref <30)

## 2015-02-22 LAB — VITAMIN D 25 HYDROXY (VIT D DEFICIENCY, FRACTURES): Vit D, 25-Hydroxy: 32 ng/mL (ref 30–100)

## 2015-02-22 LAB — MAGNESIUM: MAGNESIUM: 1.6 mg/dL (ref 1.5–2.5)

## 2015-02-22 LAB — HEMOGLOBIN A1C
Hgb A1c MFr Bld: 5.9 % — ABNORMAL HIGH (ref <5.7)
Mean Plasma Glucose: 123 mg/dL — ABNORMAL HIGH (ref <117)

## 2015-02-22 LAB — HEPATIC FUNCTION PANEL
ALBUMIN: 4.5 g/dL (ref 3.6–5.1)
ALK PHOS: 82 U/L (ref 33–130)
ALT: 45 U/L — ABNORMAL HIGH (ref 6–29)
AST: 27 U/L (ref 10–35)
BILIRUBIN INDIRECT: 0.5 mg/dL (ref 0.2–1.2)
BILIRUBIN TOTAL: 0.6 mg/dL (ref 0.2–1.2)
Bilirubin, Direct: 0.1 mg/dL (ref <=0.2)
Total Protein: 7.2 g/dL (ref 6.1–8.1)

## 2015-02-22 LAB — INSULIN, FASTING: Insulin fasting, serum: 8.8 u[IU]/mL (ref 2.0–19.6)

## 2015-02-22 LAB — TSH: TSH: 9.774 u[IU]/mL — ABNORMAL HIGH (ref 0.350–4.500)

## 2015-06-06 ENCOUNTER — Ambulatory Visit (INDEPENDENT_AMBULATORY_CARE_PROVIDER_SITE_OTHER): Payer: BLUE CROSS/BLUE SHIELD | Admitting: Internal Medicine

## 2015-06-06 ENCOUNTER — Encounter: Payer: Self-pay | Admitting: Internal Medicine

## 2015-06-06 VITALS — BP 106/64 | HR 68 | Temp 97.8°F | Resp 16 | Ht 66.0 in | Wt 188.0 lb

## 2015-06-06 DIAGNOSIS — Z79899 Other long term (current) drug therapy: Secondary | ICD-10-CM

## 2015-06-06 DIAGNOSIS — R7303 Prediabetes: Secondary | ICD-10-CM | POA: Diagnosis not present

## 2015-06-06 DIAGNOSIS — E559 Vitamin D deficiency, unspecified: Secondary | ICD-10-CM

## 2015-06-06 DIAGNOSIS — E785 Hyperlipidemia, unspecified: Secondary | ICD-10-CM

## 2015-06-06 DIAGNOSIS — E039 Hypothyroidism, unspecified: Secondary | ICD-10-CM | POA: Diagnosis not present

## 2015-06-06 LAB — CBC WITH DIFFERENTIAL/PLATELET
BASOS PCT: 0 % (ref 0–1)
Basophils Absolute: 0 10*3/uL (ref 0.0–0.1)
EOS ABS: 0.1 10*3/uL (ref 0.0–0.7)
Eosinophils Relative: 1 % (ref 0–5)
HCT: 39.2 % (ref 36.0–46.0)
Hemoglobin: 13.3 g/dL (ref 12.0–15.0)
Lymphocytes Relative: 37 % (ref 12–46)
Lymphs Abs: 2.4 10*3/uL (ref 0.7–4.0)
MCH: 31.1 pg (ref 26.0–34.0)
MCHC: 33.9 g/dL (ref 30.0–36.0)
MCV: 91.8 fL (ref 78.0–100.0)
MONOS PCT: 6 % (ref 3–12)
MPV: 10.3 fL (ref 8.6–12.4)
Monocytes Absolute: 0.4 10*3/uL (ref 0.1–1.0)
NEUTROS PCT: 56 % (ref 43–77)
Neutro Abs: 3.6 10*3/uL (ref 1.7–7.7)
PLATELETS: 240 10*3/uL (ref 150–400)
RBC: 4.27 MIL/uL (ref 3.87–5.11)
RDW: 13.5 % (ref 11.5–15.5)
WBC: 6.4 10*3/uL (ref 4.0–10.5)

## 2015-06-06 LAB — HEPATIC FUNCTION PANEL
ALT: 46 U/L — ABNORMAL HIGH (ref 6–29)
AST: 28 U/L (ref 10–35)
Albumin: 4.5 g/dL (ref 3.6–5.1)
Alkaline Phosphatase: 78 U/L (ref 33–130)
BILIRUBIN DIRECT: 0.1 mg/dL (ref <=0.2)
BILIRUBIN INDIRECT: 0.6 mg/dL (ref 0.2–1.2)
BILIRUBIN TOTAL: 0.7 mg/dL (ref 0.2–1.2)
Total Protein: 6.9 g/dL (ref 6.1–8.1)

## 2015-06-06 LAB — BASIC METABOLIC PANEL WITH GFR
BUN: 15 mg/dL (ref 7–25)
CALCIUM: 9 mg/dL (ref 8.6–10.4)
CHLORIDE: 105 mmol/L (ref 98–110)
CO2: 23 mmol/L (ref 20–31)
CREATININE: 0.73 mg/dL (ref 0.50–1.05)
GFR, Est African American: >89 mL/min (ref >=60)
Glucose, Bld: 89 mg/dL (ref 65–99)
Potassium: 4 mmol/L (ref 3.5–5.3)
SODIUM: 139 mmol/L (ref 135–146)

## 2015-06-06 LAB — LIPID PANEL
CHOL/HDL RATIO: 4.6 ratio (ref <=5.0)
CHOLESTEROL: 190 mg/dL (ref 125–200)
HDL: 41 mg/dL — ABNORMAL LOW (ref >=46)
LDL Cholesterol: 125 mg/dL (ref <130)
TRIGLYCERIDES: 122 mg/dL (ref <150)
VLDL: 24 mg/dL (ref <30)

## 2015-06-06 LAB — TSH: TSH: 6.59 mIU/L — ABNORMAL HIGH

## 2015-06-06 LAB — HEMOGLOBIN A1C
Hgb A1c MFr Bld: 5.9 % — ABNORMAL HIGH (ref <5.7)
Mean Plasma Glucose: 123 mg/dL

## 2015-06-06 MED ORDER — BUPROPION HCL ER (XL) 150 MG PO TB24: 150.0000 mg | ORAL_TABLET | ORAL | Status: DC

## 2015-06-06 MED ORDER — TRAZODONE HCL 50 MG PO TABS: 50.0000 mg | ORAL_TABLET | Freq: Every day | ORAL | Status: DC

## 2015-06-06 NOTE — Progress Notes (Signed)
Assessment and Plan:  Hypertension:  -Continue medication,  -monitor blood pressure at home.  -Continue DASH diet.   -Reminder to go to the ER if any CP, SOB, nausea, dizziness, severe HA, changes vision/speech, left arm numbness and tingling, and jaw pain.  Cholesterol: -Continue diet and exercise.  -Check cholesterol.   Pre-diabetes: -Continue diet and exercise.  -Check A1C  Vitamin D Def: -check level -continue medications.   Depression -trazodone as needed -start wellbutrin nightly  Continue diet and meds as discussed. Further disposition pending results of labs.  HPI 55 y.o. female  presents for 3 month follow up with hypertension, hyperlipidemia, prediabetes and vitamin D.   Her blood pressure has been controlled at home, today their BP is BP: 106/64 mmHg.   She does not workout. She denies chest pain, shortness of breath, dizziness.   She is not on cholesterol medication and denies myalgias. Her cholesterol is not at goal. The cholesterol last visit was:   Lab Results  Component Value Date   CHOL 217* 02/21/2015   HDL 40* 02/21/2015   LDLCALC 139* 02/21/2015   TRIG 189* 02/21/2015   CHOLHDL 5.4* 02/21/2015     She has been working on diet and exercise for prediabetes, and denies foot ulcerations, hyperglycemia, hypoglycemia , increased appetite, nausea, paresthesia of the feet, polydipsia, polyuria, visual disturbances, vomiting and weight loss. Last A1C in the office was:  Lab Results  Component Value Date   HGBA1C 5.9* 02/21/2015    Patient is on Vitamin D supplement.  Lab Results  Component Value Date   VD25OH 32 02/21/2015     She reports that she is depressed and has been having some mood swings.  A lot of it is related to her neighbors bothering her and destroying her land.  She reports that they antagonize her.  She has not been taking her antidepressant daily.  She reports that she only takes it when she needs it.  She reports that her dad has pleural  effusions and her sister recently diet.  Her husband is afraid that she is paranoid.     Current Medications:  Current Outpatient Prescriptions on File Prior to Visit  Medication Sig Dispense Refill  . buPROPion (WELLBUTRIN XL) 150 MG 24 hr tablet Take 1 tablet (150 mg total) by mouth every morning. 30 tablet 2  . Cholecalciferol (VITAMIN D PO) Take 5,000 Int'l Units by mouth daily.    . IRON PO Take by mouth daily.    Marland Kitchen levothyroxine (SYNTHROID, LEVOTHROID) 125 MCG tablet     . neomycin-polymyxin-hydrocortisone (CORTISPORIN) otic solution     . OVER THE COUNTER MEDICATION daily. Potassium- pt unsure of dose     No current facility-administered medications on file prior to visit.    Medical History:  Past Medical History  Diagnosis Date  . Depression   . Hypothyroidism   . Migraine   . Hyperlipidemia   . Prediabetes   . Labile hypertension   . GERD (gastroesophageal reflux disease)   . Fibrocystic breast disease   . Cholelithiasis     Allergies: No Known Allergies   Review of Systems:  Review of Systems  Constitutional: Negative for fever, chills and malaise/fatigue.  HENT: Negative for congestion, ear pain and sore throat.   Eyes: Negative.   Respiratory: Negative for cough, shortness of breath and wheezing.   Cardiovascular: Negative for chest pain, palpitations and leg swelling.  Gastrointestinal: Negative for heartburn, diarrhea, constipation, blood in stool and melena.  Genitourinary: Negative.  Skin: Negative.   Neurological: Negative for dizziness, sensory change, loss of consciousness and headaches.  Psychiatric/Behavioral: Positive for depression. Negative for suicidal ideas, hallucinations and memory loss. The patient is nervous/anxious and has insomnia.     Family history- Review and unchanged  Social history- Review and unchanged  Physical Exam: BP 106/64 mmHg  Pulse 68  Temp(Src) 97.8 F (36.6 C) (Temporal)  Resp 16  Ht 5\' 6"  (1.676 m)  Wt 188 lb  (85.276 kg)  BMI 30.36 kg/m2 Wt Readings from Last 3 Encounters:  06/06/15 188 lb (85.276 kg)  02/21/15 184 lb (83.462 kg)  10/04/14 191 lb (86.637 kg)    General Appearance: Well nourished well developed, in no apparent distress. Eyes: PERRLA, EOMs, conjunctiva no swelling or erythema ENT/Mouth: Ear canals normal without obstruction, swelling, erythma, discharge.  TMs normal bilaterally.  Oropharynx moist, clear, without exudate, or postoropharyngeal swelling. Neck: Supple, thyroid normal,no cervical adenopathy  Respiratory: Respiratory effort normal, Breath sounds clear A&P without rhonchi, wheeze, or rale.  No retractions, no accessory usage. Cardio: RRR with no MRGs. Brisk peripheral pulses without edema.  Abdomen: Soft, + BS,  Non tender, no guarding, rebound, hernias, masses. Musculoskeletal: Full ROM, 5/5 strength, Normal gait Skin: Warm, dry without rashes, lesions, ecchymosis.  Neuro: Awake and oriented X 3, Cranial nerves intact. Normal muscle tone, no cerebellar symptoms. Psych: Normal affect, Insight and Judgment appropriate.    Terri Piedraourtney Forcucci, PA-C 9:28 AM [facility]

## 2015-06-08 ENCOUNTER — Other Ambulatory Visit: Payer: Self-pay | Admitting: Internal Medicine

## 2015-06-08 MED ORDER — LEVOTHYROXINE SODIUM 137 MCG PO TABS: 137.0000 ug | ORAL_TABLET | Freq: Every day | ORAL | Status: DC

## 2015-07-31 ENCOUNTER — Encounter: Payer: Self-pay | Admitting: *Deleted

## 2015-09-12 ENCOUNTER — Ambulatory Visit: Payer: Self-pay | Admitting: Physician Assistant

## 2015-09-19 ENCOUNTER — Encounter: Payer: Self-pay | Admitting: Internal Medicine

## 2015-09-19 ENCOUNTER — Ambulatory Visit (INDEPENDENT_AMBULATORY_CARE_PROVIDER_SITE_OTHER): Payer: BLUE CROSS/BLUE SHIELD | Admitting: Internal Medicine

## 2015-09-19 VITALS — BP 104/60 | HR 80 | Temp 98.0°F | Resp 16 | Ht 66.0 in | Wt 184.0 lb

## 2015-09-19 DIAGNOSIS — F4329 Adjustment disorder with other symptoms: Secondary | ICD-10-CM

## 2015-09-19 DIAGNOSIS — E785 Hyperlipidemia, unspecified: Secondary | ICD-10-CM

## 2015-09-19 DIAGNOSIS — E559 Vitamin D deficiency, unspecified: Secondary | ICD-10-CM | POA: Diagnosis not present

## 2015-09-19 DIAGNOSIS — R7303 Prediabetes: Secondary | ICD-10-CM | POA: Diagnosis not present

## 2015-09-19 DIAGNOSIS — Z79899 Other long term (current) drug therapy: Secondary | ICD-10-CM | POA: Diagnosis not present

## 2015-09-19 DIAGNOSIS — E039 Hypothyroidism, unspecified: Secondary | ICD-10-CM

## 2015-09-19 DIAGNOSIS — F4321 Adjustment disorder with depressed mood: Secondary | ICD-10-CM

## 2015-09-19 DIAGNOSIS — Z634 Disappearance and death of family member: Secondary | ICD-10-CM

## 2015-09-19 DIAGNOSIS — F325 Major depressive disorder, single episode, in full remission: Secondary | ICD-10-CM | POA: Diagnosis not present

## 2015-09-19 LAB — BASIC METABOLIC PANEL WITH GFR
BUN: 17 mg/dL (ref 7–25)
CALCIUM: 9.2 mg/dL (ref 8.6–10.4)
CO2: 25 mmol/L (ref 20–31)
Chloride: 104 mmol/L (ref 98–110)
Creat: 0.73 mg/dL (ref 0.50–1.05)
GFR, Est Non African American: >89 mL/min (ref >=60)
GLUCOSE: 103 mg/dL — AB (ref 65–99)
Potassium: 3.8 mmol/L (ref 3.5–5.3)
Sodium: 137 mmol/L (ref 135–146)

## 2015-09-19 LAB — LIPID PANEL
Cholesterol: 190 mg/dL (ref 125–200)
HDL: 45 mg/dL — AB (ref >=46)
LDL Cholesterol: 120 mg/dL (ref <130)
TRIGLYCERIDES: 123 mg/dL (ref <150)
Total CHOL/HDL Ratio: 4.2 Ratio (ref <=5.0)
VLDL: 25 mg/dL (ref <30)

## 2015-09-19 LAB — CBC WITH DIFFERENTIAL/PLATELET
Basophils Absolute: 0 cells/uL (ref 0–200)
Basophils Relative: 0 %
EOS ABS: 126 {cells}/uL (ref 15–500)
Eosinophils Relative: 2 %
HEMATOCRIT: 39.6 % (ref 35.0–45.0)
Hemoglobin: 13.3 g/dL (ref 11.7–15.5)
LYMPHS PCT: 38 %
Lymphs Abs: 2394 cells/uL (ref 850–3900)
MCH: 30.3 pg (ref 27.0–33.0)
MCHC: 33.6 g/dL (ref 32.0–36.0)
MCV: 90.2 fL (ref 80.0–100.0)
MONO ABS: 378 {cells}/uL (ref 200–950)
MPV: 11.2 fL (ref 7.5–12.5)
Monocytes Relative: 6 %
NEUTROS PCT: 54 %
Neutro Abs: 3402 cells/uL (ref 1500–7800)
Platelets: 203 10*3/uL (ref 140–400)
RBC: 4.39 MIL/uL (ref 3.80–5.10)
RDW: 13.3 % (ref 11.0–15.0)
WBC: 6.3 10*3/uL (ref 3.8–10.8)

## 2015-09-19 LAB — HEPATIC FUNCTION PANEL
ALBUMIN: 4.6 g/dL (ref 3.6–5.1)
ALT: 25 U/L (ref 6–29)
AST: 18 U/L (ref 10–35)
Alkaline Phosphatase: 73 U/L (ref 33–130)
Bilirubin, Direct: 0.1 mg/dL (ref <=0.2)
Indirect Bilirubin: 0.6 mg/dL (ref 0.2–1.2)
TOTAL PROTEIN: 6.9 g/dL (ref 6.1–8.1)
Total Bilirubin: 0.7 mg/dL (ref 0.2–1.2)

## 2015-09-19 LAB — HEMOGLOBIN A1C
Hgb A1c MFr Bld: 5.8 % — ABNORMAL HIGH (ref <5.7)
Mean Plasma Glucose: 120 mg/dL

## 2015-09-19 LAB — TSH: TSH: 3.38 mIU/L

## 2015-09-19 MED ORDER — LORAZEPAM 0.5 MG PO TABS: 0.5000 mg | ORAL_TABLET | Freq: Two times a day (BID) | ORAL | Status: AC

## 2015-09-19 NOTE — Progress Notes (Signed)
Assessment and Plan:  Hypertension:  -Continue medication,  -monitor blood pressure at home.  -Continue DASH diet.   -Reminder to go to the ER if any CP, SOB, nausea, dizziness, severe HA, changes vision/speech, left arm numbness and tingling, and jaw pain.  Cholesterol: -Continue diet and exercise.  -Check cholesterol.   Pre-diabetes: -Continue diet and exercise.  -Check A1C  Vitamin D Def: -check level -continue medications.   Complicated grief -ativan prn for crying episodes  Major depressive disorder -cont wellbutrin and trazodone  Hypothyroid -cont levothyroxine -TSH  Continue diet and meds as discussed. Further disposition pending results of labs.  HPI 55 y.o. female  presents for 3 month follow up with hypertension, hyperlipidemia, prediabetes and vitamin D.   Her blood pressure has been controlled at home, today their BP is BP: 104/60 mmHg.   She does workout. She denies chest pain, shortness of breath, dizziness.   She is on cholesterol medication and denies myalgias. Her cholesterol is at goal. The cholesterol last visit was:   Lab Results  Component Value Date   CHOL 190 06/06/2015   HDL 41* 06/06/2015   LDLCALC 125 06/06/2015   TRIG 122 06/06/2015   CHOLHDL 4.6 06/06/2015     She has been working on diet and exercise for prediabetes, and denies foot ulcerations, hyperglycemia, hypoglycemia , increased appetite, nausea, paresthesia of the feet, polydipsia, polyuria, visual disturbances, vomiting and weight loss. Last A1C in the office was:  Lab Results  Component Value Date   HGBA1C 5.9* 06/06/2015  Her husband reports that she is push mowing several acres of their property and is constantly doing yard work and cleaning.  She is also walking her dog daily.   Patient is on Vitamin D supplement.  Lab Results  Component Value Date   VD25OH 32 02/21/2015     Patient reports that she is doing better with her grieving over the loss of her sister.  She has  some crying episodes.  She does feel like it is hard to grip that she is gone. She is taking the wellbutrin in the mornings.  She is inconsistent with taking this.  She take trazodone sparingly.    She is taking her thyroid medication daily.  No palpitations, difficulty swallowing, neck swelling, constipation, or dry skin.   Current Medications:  Current Outpatient Prescriptions on File Prior to Visit  Medication Sig Dispense Refill  . buPROPion (WELLBUTRIN XL) 150 MG 24 hr tablet Take 1 tablet (150 mg total) by mouth every morning. 90 tablet 2  . Cholecalciferol (VITAMIN D PO) Take 5,000 Int'l Units by mouth daily.    . IRON PO Take by mouth daily.    Marland Kitchen levothyroxine (SYNTHROID, LEVOTHROID) 137 MCG tablet Take 1 tablet (137 mcg total) by mouth daily before breakfast. 30 tablet 3  . neomycin-polymyxin-hydrocortisone (CORTISPORIN) otic solution     . OVER THE COUNTER MEDICATION daily. Potassium- pt unsure of dose    . traZODone (DESYREL) 50 MG tablet Take 1 tablet (50 mg total) by mouth at bedtime. 90 tablet 0   No current facility-administered medications on file prior to visit.    Medical History:  Past Medical History  Diagnosis Date  . Depression   . Hypothyroidism   . Migraine   . Hyperlipidemia   . Prediabetes   . Labile hypertension   . GERD (gastroesophageal reflux disease)   . Fibrocystic breast disease   . Cholelithiasis     Allergies: No Known Allergies   Review  of Systems:  Review of Systems  Constitutional: Positive for malaise/fatigue. Negative for fever and chills.  HENT: Negative for congestion, ear pain and sore throat.   Eyes: Negative.   Respiratory: Negative for cough, shortness of breath and wheezing.   Cardiovascular: Negative for chest pain, palpitations and leg swelling.  Gastrointestinal: Negative for heartburn, abdominal pain, diarrhea, constipation, blood in stool and melena.  Genitourinary: Negative.   Skin: Negative.   Neurological: Negative for  dizziness, sensory change, loss of consciousness and headaches.  Psychiatric/Behavioral: Positive for depression. The patient is nervous/anxious and has insomnia.     Family history- Review and unchanged  Social history- Review and unchanged  Physical Exam: BP 104/60 mmHg  Pulse 80  Temp(Src) 98 F (36.7 C) (Temporal)  Resp 16  Ht 5\' 6"  (1.676 m)  Wt 184 lb (83.462 kg)  BMI 29.71 kg/m2 Wt Readings from Last 3 Encounters:  09/19/15 184 lb (83.462 kg)  06/06/15 188 lb (85.276 kg)  02/21/15 184 lb (83.462 kg)    General Appearance: Well nourished well developed, in no apparent distress. Eyes: PERRLA, EOMs, conjunctiva no swelling or erythema ENT/Mouth: Ear canals normal without obstruction, swelling, erythma, discharge.  TMs normal bilaterally.  Oropharynx moist, clear, without exudate, or postoropharyngeal swelling. Neck: Supple, thyroid normal,no cervical adenopathy  Respiratory: Respiratory effort normal, Breath sounds clear A&P without rhonchi, wheeze, or rale.  No retractions, no accessory usage. Cardio: RRR with no MRGs. Brisk peripheral pulses without edema.  Abdomen: Soft, + BS,  Non tender, no guarding, rebound, hernias, masses. Musculoskeletal: Full ROM, 5/5 strength, Normal gait Skin: Warm, dry without rashes, lesions, ecchymosis.  Neuro: Awake and oriented X 3, Cranial nerves intact. Normal muscle tone, no cerebellar symptoms. Psych: Normal affect, Insight and Judgment appropriate.    Terri Piedraourtney Forcucci, PA-C 9:36 AM [facility]

## 2015-10-08 ENCOUNTER — Other Ambulatory Visit: Payer: Self-pay | Admitting: Internal Medicine

## 2016-02-22 ENCOUNTER — Encounter: Payer: Self-pay | Admitting: Physician Assistant

## 2016-04-16 ENCOUNTER — Other Ambulatory Visit: Payer: Self-pay | Admitting: Internal Medicine

## 2016-04-30 ENCOUNTER — Encounter: Payer: Self-pay | Admitting: Internal Medicine

## 2016-04-30 ENCOUNTER — Ambulatory Visit (INDEPENDENT_AMBULATORY_CARE_PROVIDER_SITE_OTHER): Payer: BLUE CROSS/BLUE SHIELD | Admitting: Internal Medicine

## 2016-04-30 VITALS — BP 124/78 | Temp 98.0°F | Resp 16 | Ht 66.0 in | Wt 188.0 lb

## 2016-04-30 DIAGNOSIS — I10 Essential (primary) hypertension: Secondary | ICD-10-CM | POA: Diagnosis not present

## 2016-04-30 DIAGNOSIS — E559 Vitamin D deficiency, unspecified: Secondary | ICD-10-CM | POA: Diagnosis not present

## 2016-04-30 DIAGNOSIS — Z136 Encounter for screening for cardiovascular disorders: Secondary | ICD-10-CM

## 2016-04-30 DIAGNOSIS — Z79899 Other long term (current) drug therapy: Secondary | ICD-10-CM | POA: Diagnosis not present

## 2016-04-30 DIAGNOSIS — F325 Major depressive disorder, single episode, in full remission: Secondary | ICD-10-CM

## 2016-04-30 DIAGNOSIS — Z0000 Encounter for general adult medical examination without abnormal findings: Secondary | ICD-10-CM | POA: Diagnosis not present

## 2016-04-30 DIAGNOSIS — R7303 Prediabetes: Secondary | ICD-10-CM

## 2016-04-30 DIAGNOSIS — Z130 Encounter for screening for diseases of the blood and blood-forming organs and certain disorders involving the immune mechanism: Secondary | ICD-10-CM

## 2016-04-30 DIAGNOSIS — Z0001 Encounter for general adult medical examination with abnormal findings: Secondary | ICD-10-CM

## 2016-04-30 DIAGNOSIS — E039 Hypothyroidism, unspecified: Secondary | ICD-10-CM

## 2016-04-30 DIAGNOSIS — E782 Mixed hyperlipidemia: Secondary | ICD-10-CM

## 2016-04-30 DIAGNOSIS — Z1389 Encounter for screening for other disorder: Secondary | ICD-10-CM

## 2016-04-30 LAB — CBC WITH DIFFERENTIAL/PLATELET
Basophils Absolute: 0 cells/uL (ref 0–200)
Basophils Relative: 0 %
EOS PCT: 2 %
Eosinophils Absolute: 146 cells/uL (ref 15–500)
HCT: 42 % (ref 35.0–45.0)
HEMOGLOBIN: 13.9 g/dL (ref 11.7–15.5)
LYMPHS ABS: 2482 {cells}/uL (ref 850–3900)
Lymphocytes Relative: 34 %
MCH: 30.2 pg (ref 27.0–33.0)
MCHC: 33.1 g/dL (ref 32.0–36.0)
MCV: 91.3 fL (ref 80.0–100.0)
MPV: 11 fL (ref 7.5–12.5)
Monocytes Absolute: 438 cells/uL (ref 200–950)
Monocytes Relative: 6 %
NEUTROS ABS: 4234 {cells}/uL (ref 1500–7800)
Neutrophils Relative %: 58 %
Platelets: 271 10*3/uL (ref 140–400)
RBC: 4.6 MIL/uL (ref 3.80–5.10)
RDW: 13.2 % (ref 11.0–15.0)
WBC: 7.3 10*3/uL (ref 3.8–10.8)

## 2016-04-30 LAB — LIPID PANEL
CHOL/HDL RATIO: 4.9 ratio (ref <5.0)
CHOLESTEROL: 216 mg/dL — AB (ref <200)
HDL: 44 mg/dL — AB (ref >50)
LDL Cholesterol: 139 mg/dL — ABNORMAL HIGH (ref <100)
TRIGLYCERIDES: 165 mg/dL — AB (ref <150)
VLDL: 33 mg/dL — AB (ref <30)

## 2016-04-30 LAB — BASIC METABOLIC PANEL WITH GFR
BUN: 14 mg/dL (ref 7–25)
CALCIUM: 9.3 mg/dL (ref 8.6–10.4)
CHLORIDE: 105 mmol/L (ref 98–110)
CO2: 24 mmol/L (ref 20–31)
Creat: 0.71 mg/dL (ref 0.50–1.05)
GFR, Est African American: >89 mL/min (ref >=60)
Glucose, Bld: 89 mg/dL (ref 65–99)
Potassium: 4.1 mmol/L (ref 3.5–5.3)
SODIUM: 139 mmol/L (ref 135–146)

## 2016-04-30 LAB — HEMOGLOBIN A1C
Hgb A1c MFr Bld: 5.6 % (ref <5.7)
MEAN PLASMA GLUCOSE: 114 mg/dL

## 2016-04-30 LAB — HEPATIC FUNCTION PANEL
ALT: 41 U/L — AB (ref 6–29)
AST: 27 U/L (ref 10–35)
Albumin: 4.7 g/dL (ref 3.6–5.1)
Alkaline Phosphatase: 77 U/L (ref 33–130)
BILIRUBIN DIRECT: 0.1 mg/dL (ref <=0.2)
BILIRUBIN INDIRECT: 0.5 mg/dL (ref 0.2–1.2)
Total Bilirubin: 0.6 mg/dL (ref 0.2–1.2)
Total Protein: 7.4 g/dL (ref 6.1–8.1)

## 2016-04-30 LAB — TSH: TSH: 3.24 mIU/L

## 2016-04-30 LAB — IRON AND TIBC
%SAT: 44 % (ref 11–50)
Iron: 137 ug/dL (ref 45–160)
TIBC: 314 ug/dL (ref 250–450)
UIBC: 177 ug/dL (ref 125–400)

## 2016-04-30 LAB — VITAMIN B12: VITAMIN B 12: 533 pg/mL (ref 200–1100)

## 2016-04-30 NOTE — Patient Instructions (Signed)
Perimenopausia  (Perimenopause)  La perimenopausia es el momento en que su cuerpo comienza a pasar a la menopausia (sin menstruación durante 12 meses consecutivos). Es un proceso natural. La perimenopausia puede comenzar entre 2 y 8 años antes de la menopausia y por lo general tiene una duración de 1 año más pasada la menopausia. Durante este tiempo, los ovarios podrían producir un óvulo o no. Los ovarios varían su producción de las hormonas estrógeno y progesterona cada mes. Esto puede causar períodos menstruales irregulares, dificultad para quedar embarazada, hemorragia vaginal entre períodos y síntomas incómodos.  CAUSAS  · Producción irregular de las hormonas ováricas estrógeno y progesterona, y no ovular todos los meses.  · Otras causas son:  ? Tumor de la glándula pituitaria.  ? Enfermedades que afectan los ovarios.  ? Radioterapia.  ? Quimioterapia.  ? Causas desconocidas.  ? Fumar mucho y abusar del consumo de alcohol puede llevar a que la perimenopausia aparezca antes.    SIGNOS Y SÍNTOMAS  · Acaloramiento.  · Sudoración nocturna.  · Períodos menstruales irregulares.  · Disminución del deseo sexual.  · Sequedad vaginal.  · Dolores de cabeza.  · Cambios en el estado de ánimo.  · Depresión.  · Problemas de memoria.  · Irritabilidad.  · Cansancio.  · Aumento de peso.  · Problemas para quedar embarazada.  · Pérdida de células óseas (osteoporosis).  · Comienzo de endurecimiento de las arterias (aterosclerosis).    DIAGNÓSTICO  El médico realizará un diagnóstico en función de su edad, historial de períodos menstruales y síntomas. Le realizarán un examen físico para ver si hay algún cambio en su cuerpo, en especial en sus órganos reproductores. Las pruebas hormonales pueden ser o no útiles según la cantidad de hormonas femeninas que produzca y cuándo las produzca. Sin embargo, podrán realizarse otras pruebas hormonales para detectar otros problemas.  TRATAMIENTO   En algunos casos, no se necesita tratamiento. La decisión acerca de qué tratamiento es necesario durante la perimenopausia deberá realizarse en conjunto con su médico según cómo estén afectando los síntomas a su estilo de vida. Existen varios tratamientos disponibles, como:  · Tratar cada síntoma individual con medicamentos específicos para ese síntoma.  · Algunos medicamentos herbales pueden ayudar en síntomas específicos.  · Psicoterapia.  · Terapia grupal.  INSTRUCCIONES PARA EL CUIDADO EN EL HOGAR  · Controle sus periodos menstruales (cuándo ocurren, qué tan abundantes son, cuánto tiempo pasa entre períodos, y cuánto duran) como también sus síntomas y cuándo comenzaron.  · Tome sólo medicamentos de venta libre o recetados, según las indicaciones del médico.  · Duerma y descanse.  · Haga actividad física.  · Consuma una dieta que contenga calcio (bueno para los huesos) y productos derivados de la soja (actúan como estrógenos).  · No fume.  · Evite las bebidas alcohólicas.  · Tome los suplementos vitamínicos según las indicaciones del médico. En ciertos casos, puede ser de ayuda tomar vitamina E.  · Tome suplementos de calcio y vitamina D para ayudar a prevenir la pérdida ósea.  · En algunos casos la terapia de grupo podrá ayudarla.  · La acupuntura puede ser de ayuda en ciertos casos.    SOLICITE ATENCIÓN MÉDICA SI:  · Tiene preguntas acerca de sus síntomas.  · Necesita ser derivada a un especialista (ginecólogo, psiquiatra, o psicólogo).    SOLICITE ATENCIÓN MÉDICA DE INMEDIATO SI:  · Sufre una hemorragia vaginal abundante.  · Su período menstrual dura más de 8 días.  · Sus períodos son recurrentes   cada menos de 21 días.  · Tiene hemorragias durante las relaciones sexuales.  · Está muy deprimido.  · Siente dolor al orinar.  · Siente dolor de cabeza intenso.  · Tiene problemas de visión.    Esta información no tiene como fin reemplazar el consejo del médico.  Asegúrese de hacerle al médico cualquier pregunta que tenga.  Document Released: 02/26/2005 Document Revised: 12/17/2012 Document Reviewed: 09/25/2012  [employer] Interactive Patient Education © 2017 [employer]

## 2016-04-30 NOTE — Progress Notes (Signed)
Complete Physical  Assessment and Plan:   1. Encounter for general adult medical examination with abnormal findings -is overdue for mammogram -have suggested she call either [facility] or [facility] to set up appointment.  2. Hypothyroidism, unspecified type -cont levothyroxine -dose adjust if necessary - TSH  3. Mixed hyperlipidemia -cont diet and exercise -is walking dog twice daily - Lipid panel - EKG 12-Lead  4. Prediabetes -cont diet and exercise - Hemoglobin A1c - Insulin, random  5. Vitamin D deficiency -cont VIt D supplement - VITAMIN D 25 Hydroxy (Vit-D Deficiency, Fractures)  6. Medication management  - CBC with Differential/Platelet - BASIC METABOLIC PANEL WITH GFR - Hepatic function panel - Magnesium  7. Depression, major, in remission (HCC) -PHQ29 score currently 0 -cont current medications  8. Morbid obesity (BMI 30.71) -diet and exercise   9. Screening for deficiency anemia  - Iron and TIBC - Vitamin B12  10. Screening for hematuria or proteinuria  - Urinalysis, Routine w reflex microscopic - Microalbumin / creatinine urine ratio   Discussed med's effects and SE's. Screening labs and tests as requested with regular follow-up as recommended.  HPI  56 y.o. female  presents for a complete physical.  Her blood pressure has been controlled at home, today their BP is BP: 124/78.  She does not workout. She denies chest pain, shortness of breath, dizziness.   She is on cholesterol medication and denies myalgias. Her cholesterol is not at goal. The cholesterol last visit was:  Lab Results  Component Value Date   CHOL 190 09/19/2015   HDL 45 (L) 09/19/2015   LDLCALC 120 09/19/2015   TRIG 123 09/19/2015   CHOLHDL 4.2 09/19/2015  .  She has been working on diet and exercise for prediabetes, she is on bASA, she is not on ACE/ARB and denies foot ulcerations, hyperglycemia, hypoglycemia , increased appetite, nausea, paresthesia of the feet,  polydipsia, polyuria, visual disturbances, vomiting and weight loss. Last A1C in the office was:  Lab Results  Component Value Date   HGBA1C 5.8 (H) 09/19/2015    Patient is on Vitamin D supplement.   Lab Results  Component Value Date   VD25OH 32 02/21/2015     She reports that she has been having some hot flashes in the morning.  It does not interrupt her sleep.  Once she gets up she rpeorts that she feels hot like the sun is on her and then it goes away.    She reports that there is a spot on her right hip which she is worried about.  She has had a mole removed before.  She reports that she wants [place] to look at it.    She reports that her moods are much better than last time.  She reports that she feels better.  She reports that she is able to relax more.  She reports that she has not had any further problems.    She still takes her thyroid medications first thing in the morning.  She tries to make sure she takes them on an empty stomach.  Current Medications:  Current Outpatient Prescriptions on File Prior to Visit  Medication Sig Dispense Refill  . buPROPion (WELLBUTRIN XL) 150 MG 24 hr tablet Take 1 tablet (150 mg total) by mouth every morning. 90 tablet 2  . Cholecalciferol (VITAMIN D PO) Take 5,000 Int'l Units by mouth daily.    . IRON PO Take by mouth daily.    Marland Kitchen levothyroxine (SYNTHROID, LEVOTHROID) 137 MCG tablet  TAKE ONE TABLET BY MOUTH ONCE DAILY BEFORE BREAKFAST 30 tablet 0  . LORazepam (ATIVAN) 0.5 MG tablet Take 1 tablet (0.5 mg total) by mouth 2 (two) times daily. 60 tablet 0  . neomycin-polymyxin-hydrocortisone (CORTISPORIN) otic solution     . OVER THE COUNTER MEDICATION daily. Potassium- pt unsure of dose    . traZODone (DESYREL) 50 MG tablet Take 1 tablet (50 mg total) by mouth at bedtime. 90 tablet 0   No current facility-administered medications on file prior to visit.     Health Maintenance:   Immunization History  Administered Date(s) Administered  .  Influenza Split 02/08/2014  . Influenza, Seasonal, Injecte, Preservative Fre 02/21/2015  . Td 03/12/2002  . Tdap 02/08/2014    Tetanus: 2015 Flu vaccine: 2016, declined Pap:November 2015 MGM:  Colonoscopy: 2015  Patient Care Team: Lucky Cowboy, MD as PCP - General (Internal Medicine) Dorisann Frames, MD as Consulting Physician (Endocrinology)  Allergies: No Known Allergies  Medical History:  Past Medical History:  Diagnosis Date  . Cholelithiasis   . Depression   . Fibrocystic breast disease   . GERD (gastroesophageal reflux disease)   . Hyperlipidemia   . Hypothyroidism   . Labile hypertension   . Migraine   . Prediabetes     Surgical History:  Past Surgical History:  Procedure Laterality Date  . thyroid ablation  2008    Family History:  Family History  Problem Relation Age of Onset  . Hypertension Mother   . Hyperlipidemia Father   . Aneurysm Sister     brain  . Heart attack Paternal Grandfather 55    Social History:  Social History  Substance Use Topics  . Smoking status: Never Smoker  . Smokeless tobacco: Never Used  . Alcohol use 0.0 oz/week     Comment: rare    Review of Systems: Review of Systems  Constitutional: Negative for chills, fever and malaise/fatigue.  HENT: Negative for congestion, ear pain and sore throat.   Eyes: Negative.   Respiratory: Negative for cough, shortness of breath and wheezing.   Cardiovascular: Negative for chest pain, palpitations and leg swelling.  Gastrointestinal: Negative for abdominal pain, blood in stool, constipation, diarrhea, heartburn and melena.  Genitourinary: Negative.   Skin: Negative.   Neurological: Negative for dizziness, sensory change, loss of consciousness and headaches.  Psychiatric/Behavioral: Negative for depression. The patient is not nervous/anxious and does not have insomnia.     Physical Exam: Estimated body mass index is 30.34 kg/m as calculated from the following:   Height as of  this encounter: 5\' 6"  (1.676 m).   Weight as of this encounter: 188 lb (85.3 kg). BP 124/78   Temp 98 F (36.7 C) (Temporal)   Resp 16   Ht 5\' 6"  (1.676 m)   Wt 188 lb (85.3 kg)   BMI 30.34 kg/m   Wt Readings from Last 3 Encounters:  04/30/16 188 lb (85.3 kg)  09/19/15 184 lb (83.5 kg)  06/06/15 188 lb (85.3 kg)    General Appearance: Well nourished well developed, in no apparent distress.  Eyes: PERRLA, EOMs, conjunctiva no swelling or erythema ENT/Mouth: Ear canals normal without obstruction, swelling, erythema, or discharge.  TMs normal bilaterally with no erythema, bulging, retraction, or loss of landmark.  Oropharynx moist and clear with no exudate, erythema, or swelling.   Neck: Supple, thyroid normal. No bruits.  No cervical adenopathy Respiratory: Respiratory effort normal, Breath sounds clear A&P without wheeze, rhonchi, rales.   Cardio: RRR without murmurs, rubs  or gallops. Brisk peripheral pulses without edema.  Chest: symmetric, with normal excursions Breasts: Symmetric, without lumps, nipple discharge, retractions. Single dark 3 mm round symmetric nevi to the right axillary region Abdomen: Soft, nontender, no guarding, rebound, hernias, masses, or organomegaly.  Lymphatics: Non tender without lymphadenopathy.  Genitourinary: Declined by patient, due for pap in 2018 Musculoskeletal: Full ROM all peripheral extremities,5/5 strength, and normal gait.  Skin: Warm, dry without rashes, lesions, ecchymosis. Neuro: Awake and oriented X 3, Cranial nerves intact, reflexes equal bilaterally. Normal muscle tone, no cerebellar symptoms. Sensation intact.  Psych:  normal affect, Insight and Judgment appropriate.   EKG: WNL no changes.   Over 40 minutes of exam, counseling, chart review and critical decision making was performed  Terri Piedraourtney Forcucci 10:49 AM [facility]

## 2016-05-01 LAB — URINALYSIS, ROUTINE W REFLEX MICROSCOPIC
Bilirubin Urine: NEGATIVE
Glucose, UA: NEGATIVE
Hgb urine dipstick: NEGATIVE
Ketones, ur: NEGATIVE
LEUKOCYTES UA: NEGATIVE
NITRITE: NEGATIVE
PH: 5.5 (ref 5.0–8.0)
PROTEIN: NEGATIVE
Specific Gravity, Urine: 1.021 (ref 1.001–1.035)

## 2016-05-01 LAB — URINALYSIS, MICROSCOPIC ONLY
Bacteria, UA: NONE SEEN [HPF]
CASTS: NONE SEEN [LPF]
RBC / HPF: NONE SEEN RBC/HPF (ref <=2)
YEAST: NONE SEEN [HPF]

## 2016-05-01 LAB — MICROALBUMIN / CREATININE URINE RATIO
CREATININE, URINE: 170 mg/dL (ref 20–320)
MICROALB UR: 1.4 mg/dL
MICROALB/CREAT RATIO: 8 ug/mg{creat} (ref <30)

## 2016-05-01 LAB — MAGNESIUM: Magnesium: 1.7 mg/dL (ref 1.5–2.5)

## 2016-05-01 LAB — VITAMIN D 25 HYDROXY (VIT D DEFICIENCY, FRACTURES): Vit D, 25-Hydroxy: 36 ng/mL (ref 30–100)

## 2016-05-01 LAB — INSULIN, RANDOM: Insulin: 7.9 u[IU]/mL (ref 2.0–19.6)

## 2016-11-19 ENCOUNTER — Ambulatory Visit: Payer: Self-pay | Admitting: Internal Medicine

## 2016-11-19 ENCOUNTER — Ambulatory Visit: Payer: Self-pay | Admitting: Physician Assistant

## 2016-12-02 NOTE — Progress Notes (Signed)
FOLLOW UP  Assessment and Plan:    Major Depression currently in remission  Continue to monitor; use trazodone as needed. Call if you begin experiencing symptoms again and we can restart wellbutrin or switch to an agent that will also help with hot flashes.   Cholesterol  Discussed possibly initiating a statin pending today's lab results  Continue diet and exercise.   Check lipid panel.    Prediabetes  Currently stable at goal with normal A1C  Continue diet and exercise.   Perform daily foot/skin check, notify office of any concerning changes.   Check A1C  Vitamin D Def  Continue medications:   Check Vit D level  Stress Incontinence  Information about kegal exercises provided, encouraged to try this several times a day regularly, discussed PT/medication as possible options in the future should she want to pursue  Encouraged to drink more during the day and reduce night time fluids to evaluate if this will improve night time awakenings.   Continue diet and meds as discussed. Further disposition pending results of labs. Discussed med's effects and SE's.   Over 30 minutes of exam, counseling, chart review, and critical decision making was performed.   Future Appointments Date Time Provider Department Center  12/03/2016 10:45 AM Judd Gaudier, NP [facility] None  05/06/2017 10:45 AM [name], PA-C [facility] None    ----------------------------------------------------------------------------------------------------------------------  HPI 56 y.o. female  presents for 3 month follow up on hypothyroidism, cholesterol, prediabetes, vitamin D deficiency and depression.   She reports stress incontinence that is ongoing, not currently interfering with activities, as well as frequent awakening at night (3-4x)- she reports that she does not drink much during the day, but gets thirsty at night and drinks a glass of milk and several bottles of water. Encouraged her to  drink more during the day, and to stop fluids 2 hours prior to sleep.   She also reports her last regular menstrual cycle was in 2017 sometime, but has continued intermittent spotting with wiping. She also reports increased sweat between her legs at night, but is utilizing a fan at night and managing. Discussed that there are some medication options that we can consider that would be beneficial for depression in addition to her symptoms.   She is currently no longer taking wellbutrin 150 mg daily and is tolerating well;  She takes trazodone 50 mg PRN in the evening to help with sleep, typically 1-2 x per week if melatonin is insufficient, and reports her symptoms are well controlled.   She does workout. She denies chest pain, shortness of breath, dizziness.   She is not on cholesterol medication. Her cholesterol is not at goal. The cholesterol last visit was:   Lab Results  Component Value Date   CHOL 216 (H) 04/30/2016   HDL 44 (L) 04/30/2016   LDLCALC 139 (H) 04/30/2016   TRIG 165 (H) 04/30/2016   CHOLHDL 4.9 04/30/2016    She has been working on diet and exercise for prediabetes, and denies hyperglycemia, hypoglycemia , nausea, paresthesia of the feet and polydipsia. Last A1C in the office was at goal:  Lab Results  Component Value Date   HGBA1C 5.6 04/30/2016   Patient is on Vitamin D supplement but below goal of 70-100:   Lab Results  Component Value Date   VD25OH 36 04/30/2016          Current Medications:  Current Outpatient Prescriptions on File Prior to Visit  Medication Sig  . buPROPion (WELLBUTRIN XL) 150  MG 24 hr tablet Take 1 tablet (150 mg total) by mouth every morning.  . Cholecalciferol (VITAMIN D PO) Take 5,000 Int'l Units by mouth daily.  . IRON PO Take by mouth daily.  Marland Kitchen levothyroxine (SYNTHROID, LEVOTHROID) 137 MCG tablet TAKE ONE TABLET BY MOUTH ONCE DAILY BEFORE BREAKFAST  . neomycin-polymyxin-hydrocortisone (CORTISPORIN) otic solution   . OVER THE  COUNTER MEDICATION daily. Potassium- pt unsure of dose  . traZODone (DESYREL) 50 MG tablet Take 1 tablet (50 mg total) by mouth at bedtime.   No current facility-administered medications on file prior to visit.      Allergies: No Known Allergies   Medical History:  Past Medical History:  Diagnosis Date  . Cholelithiasis   . Depression   . Fibrocystic breast disease   . GERD (gastroesophageal reflux disease)   . Hyperlipidemia   . Hypothyroidism   . Labile hypertension   . Migraine   . Prediabetes    Family history- Reviewed and unchanged Social history- Reviewed and unchanged   Review of Systems:  Review of Systems  Constitutional: Negative.  Negative for chills, diaphoresis, fever, malaise/fatigue and weight loss.  HENT: Negative for congestion, ear discharge, ear pain, hearing loss, sinus pain and sore throat.   Eyes: Negative for blurred vision.  Respiratory: Negative for cough, hemoptysis, sputum production, shortness of breath and wheezing.   Cardiovascular: Negative for chest pain, palpitations and claudication.  Gastrointestinal: Negative for abdominal pain, blood in stool, constipation, diarrhea, heartburn, melena, nausea and vomiting.  Genitourinary: Positive for frequency. Negative for dysuria. Urgency: Nocturnal.  Musculoskeletal: Negative for myalgias.  Skin: Negative.  Negative for rash.  Neurological: Negative for dizziness, sensory change and headaches.  Endo/Heme/Allergies: Negative.  Negative for polydipsia.  Psychiatric/Behavioral: Negative for depression, substance abuse and suicidal ideas. The patient has insomnia (Reportedly triggered by husband CPAP). The patient is not nervous/anxious.       Physical Exam: There were no vitals taken for this visit. Wt Readings from Last 3 Encounters:  04/30/16 188 lb (85.3 kg)  09/19/15 184 lb (83.5 kg)  06/06/15 188 lb (85.3 kg)   General Appearance: Well nourished, in no apparent distress. Eyes: PERRLA,  EOMs, conjunctiva no swelling or erythema Sinuses: No Frontal/maxillary tenderness ENT/Mouth: Ext aud canals clear, TMs without erythema, bulging. No erythema, swelling, or exudate on post pharynx.  Tonsils not swollen or erythematous. Hearing normal.  Neck: Supple, thyroid normal.  Respiratory: Respiratory effort normal, BS equal bilaterally without rales, rhonchi, wheezing or stridor.  Cardio: RRR with no MRGs. Brisk peripheral pulses without edema.  Abdomen: Soft, + BS.  Non tender, no guarding, rebound, hernias, masses. Lymphatics: Non tender without lymphadenopathy.  Musculoskeletal: Full ROM, 5/5 strength, Normal gait Skin: Warm, dry without rashes, lesions, ecchymosis.  Neuro: Cranial nerves intact. No cerebellar symptoms.  Psych: Awake and oriented X 3, normal affect, Insight and Judgment appropriate.    Dan Maker, NP 3:57 PM [facility]

## 2016-12-03 ENCOUNTER — Ambulatory Visit (INDEPENDENT_AMBULATORY_CARE_PROVIDER_SITE_OTHER): Payer: BLUE CROSS/BLUE SHIELD | Admitting: Adult Health

## 2016-12-03 ENCOUNTER — Encounter: Payer: Self-pay | Admitting: Adult Health

## 2016-12-03 VITALS — BP 118/86 | HR 68 | Temp 97.4°F | Resp 14 | Ht 66.0 in | Wt 183.2 lb

## 2016-12-03 DIAGNOSIS — F325 Major depressive disorder, single episode, in full remission: Secondary | ICD-10-CM

## 2016-12-03 DIAGNOSIS — N393 Stress incontinence (female) (male): Secondary | ICD-10-CM

## 2016-12-03 DIAGNOSIS — E782 Mixed hyperlipidemia: Secondary | ICD-10-CM

## 2016-12-03 DIAGNOSIS — R7303 Prediabetes: Secondary | ICD-10-CM | POA: Diagnosis not present

## 2016-12-03 DIAGNOSIS — E559 Vitamin D deficiency, unspecified: Secondary | ICD-10-CM | POA: Diagnosis not present

## 2016-12-03 DIAGNOSIS — E039 Hypothyroidism, unspecified: Secondary | ICD-10-CM | POA: Diagnosis not present

## 2016-12-03 DIAGNOSIS — Z79899 Other long term (current) drug therapy: Secondary | ICD-10-CM

## 2016-12-03 DIAGNOSIS — R5383 Other fatigue: Secondary | ICD-10-CM

## 2016-12-03 NOTE — Patient Instructions (Signed)
Look up Kegal's exercises for your incontinence- Drink more water (80 oz) of water throughout the day, stop drinking 2 hours before bed.   Atorvastatin tablets Qu es este medicamento? La ATORVASTATINA es conocido como un inhibidor de la HMG-CoA reductasa o 'estatina'. Reduce el nivel de colesterol y triglicridos en la sangre. Este medicamento tambin puede reducir el riesgo de Marine scientist ataques cardiacos, derrame cerebral u otros problemas de la salud en pacientes que corren el riesgo de padecer una enfermedad cardiaca. Marnee Guarneri y cambios a su estilo de vida son a menudo utilizados con Industrial/product designer. Este medicamento puede ser utilizado para otros usos; si tiene alguna pregunta consulte con su proveedor de atencin mdica o con su farmacutico. MARCAS COMUNES: Lipitor Qu le debo informar a mi profesional de la salud antes de tomar este medicamento? Necesita saber si usted presenta alguno de los siguientes problemas o situaciones: -consume bebidas alcohlicas con frecuencia -antecedentes de derrame cerebral, TIA -enfermedad renal -enfermedad heptica -dolores o debilidades musculares -otra condicin mdica -una reaccin alrgica o inusual a la atorvastatina, a otros medicamentos, alimentos, colorantes o conservadores -si est embarazada o buscando quedar embarazada -si est amamantando a un beb Cmo debo utilizar este medicamento? Tome este medicamento por va oral con un vaso de agua. Siga las instrucciones de la etiqueta del [place]. Puede tomar este medicamento con o sin alimentos. Tome sus dosis a intervalos regulares. No tome su medicamento con una frecuencia mayor a la indicada. Hable con su pediatra para informarse acerca del uso de este medicamento en nios. Aunque este medicamento ha sido recetado a nios tan menores como de 10 aos de edad para condiciones selectivas, las precauciones se aplican. Sobredosis: Pngase en contacto inmediatamente con un centro toxicolgico o  una sala de urgencia si usted cree que haya tomado demasiado medicamento. ATENCIN: [employer] es solo para usted. No comparta este medicamento con nadie. Qu sucede si me olvido de una dosis? Si olvida una dosis, tmela lo antes posible. Si es casi la hora de su dosis siguiente, tome slo esa dosis. No tome dosis adicionales o dobles. Qu puede interactuar con este medicamento? No tome esta medicina con ninguno de los siguientes medicamentos: -levadura roja de arroz -telaprevir -telitromicina -voriconazol Esta medicina tambin puede interactuar con los siguientes medicamentos: -alcohol -medicamentos antivirales para el VIH o SIDA -boceprevir -ciertos antibiticos, tales como Facilities manager, eritromicina, troleandomicina -ciertos medicamentos para el colesterol, tales como fenofibrato o gemfibrozil -cimetidina -claritromicina -colchicina -ciclosporina -digoxina -hormonas femeninas, como estrgenos, progestinas o pldoras anticonceptivas -jugo de toronja -medicamentos para las infecciones micticas, tales como fluconazol, itraconazol, quetoconazol -niacina -rifampicina -espironolactona Puede ser que esta lista no menciona todas las posibles interacciones. Informe a su profesional de [employer] de [employer] productos a base de hierbas, medicamentos de [place] o suplementos nutritivos que est tomando. Si usted fuma, consume bebidas alcohlicas o si utiliza drogas ilegales, indqueselo tambin a su profesional de [employer]. Algunas sustancias pueden interactuar con su medicamento. A qu debo estar atento al usar [employer]? Visite a su mdico o a su profesional de la salud para chequeos peridicos. Puede necesitar exmenes regulares para asegurarse de que el hgado est funcionando bien. Informe a su mdico o a su profesional de la salud tan pronto como pueda si tiene debilidad, sensibilidad o dolores musculares que no tienen explicacin, especialmente si tambin tiene  fiebre y [place]. Su mdico o profesional de Radiographer, therapeutic puede informarle que deje de tomar este medicamento si desarrolla problemas musculares. Si sus  problemas musculares no desaparecen despus de parar este medicamento, comunquese con su profesional de [employer]. Este medicamento es slo parte de un programa integral para la salud de Programmer, multimedia. Su mdico o dietista pueden sugerirle una dieta con bajo contenido de colesterol y de grasa para ayudarle. Evite el alcohol y Art therapist, y [place] un programa adecuado de ejercicios fsicos. No utilice este medicamento si est embarazada o amamantando. Existe la posibilidad de efectos secundarios graves a un beb sin nacer o a Radio broadcast assistant. Para ms informacin hable con su farmacutico o su mdico. Este medicamento puede afectar su nivel de azcar en la sangre. Si tiene diabetes, consulte a su mdico o a su profesional de la salud antes de cambiar su dieta o la dosis de su medicamento para la diabetes. Si va a someterse a una operacin, informe a su profesional de la salud que est tomando [employer]. Qu efectos secundarios puedo tener al [employer] este medicamento? Efectos secundarios que debe informar a su mdico o a Producer, television/film/video de la salud tan pronto como sea posible: -Therapist, art como erupcin cutnea, picazn o urticarias, hinchazn de la cara, labios o lengua -orina de color amarillo oscuro -fiebre -dolores articulares -calambres, dolores musculares -enrojecimiento, formacin de ampollas, descamacin o distensin de la piel, inclusive dentro de la boca -dificultad para orinar o cambios en el volumen de orina -cansancio o debilidad inusual -color amarillento de ojos o piel Efectos secundarios que, por lo general, no requieren atencin mdica (debe informarlos a su mdico o a su profesional de la salud si persisten o si son molestos): -estreimiento -acidez de estmago -gas, dolor, Programme researcher, broadcasting/film/video Puede ser que esta lista no menciona  todos los posibles efectos secundarios. Comunquese a su mdico por asesoramiento mdico [employer]. Usted puede informar los efectos secundarios a la FDA por telfono al 1-800-FDA-1088. Dnde debo guardar mi medicina? Mantngala fuera del alcance de los nios. Gurdela a una [employer] 20 y 25 grados C (82 y 68 grados F). Deseche todo el medicamento que no haya utilizado, despus de la fecha de vencimiento. ATENCIN: Este folleto es un resumen. Puede ser que no cubra toda la posible informacin. Si usted tiene preguntas acerca de esta medicina, consulte con su mdico, su farmacutico o su profesional de Radiographer, therapeutic.  2018 Elsevier/Gold Standard (2014-04-20 00:00:00)   Josem Kaufmann urinaria (Urinary Incontinence) La incontinencia urinaria es la prdida involuntaria de orina de la vejiga. CAUSAS Hay muchas causas de incontinencia urinaria. Ellas son:  Medicamentos.  Infecciones.  Agrandamiento prosttico que causa un aumento del flujo de [place] de la vejiga.  Ciruga.  Enfermedades neurolgicas.  Factores emocionales. SIGNOS Y SNTOMAS Hay cuatro tipos de incontinencia urinaria: 1. Incontinencia urinaria de urgencia: es la prdida involuntaria de orina antes de llegar al bao. Hay una urgencia repentina de orinar pero no llega a tiempo al bao. 2. Incontinencia por estrs: es la prdida repentina de orina con cualquier actividad que fuerza a Chiropractor orina. La causa ms frecuente son los cambios anatmicos en la pelvis y las zonas del esfnter. 3. Incontinencia por rebosamiento: es la prdida de orina por una obstruccin de la apertura de la vejiga. Esto causa un retroceso de la orina y una acumulacin de presin dentro de la vejiga. Cuando la presin dentro de la vejiga excede la presin que Research officer, political party, la orina rebalsa y causa incontinencia, similar al desbordamiento de un dique. 4. Incontinencia total: es la prdida de orina como resultado de la  incapacidad de  almacenar orina dentro de la vejiga. DIAGNSTICO La evaluacin de la causa puede requerir:  Neomia Dear historia mdica y obsttrica exhaustiva y [place].  Examen fsico completo.  Anlisis de laboratorio como un urocultivo y [place]. Cuando se indican estudios adicionales, pueden incluirse:  Una ecografa.  Radiografas de vejiga y riones.  Una cistoscopa. Consiste en un examen de la vejiga utilizando un telescopio pequeo.  Estudios urodinmicos para comprobar la funcin nerviosa de la vejiga y Programmer, multimedia. TRATAMIENTO El tratamiento de la incontinencia urinaria depende de la causa:  Para la incontinencia urinaria causada por una infeccin urinaria, le indicarn antibiticos. Si la incontinencia urinaria se relaciona con los [employer] toma, el mdico podr Estate agent.  Para la incontinencia por estrs, en necesaria una ciruga para restablecer el soporte anatmico de la vejiga o el esfnter, o ambos, lo que Designer, jewellery.  Para la incontinencia por rebosamiento causada por una prstata agrandada, una operacin para abrir el canal a travs de la prstata agrandada permitir que el flujo de orina salga de la vejiga. En las mujeres con fibromas, ser necesaria una histerectoma.  Para la incontinencia total, una ciruga del esfnter podr ayudar. Puede ser necesario colocar un esfnter urinario artificial (se coloca un manguito inflable alrededor de Engineer, mining). En las mujeres que hayan desarrollado un pasaje similar a un orificio entre la vejiga y la vagina (fstula vesicovaginal ), ser necesario realizar una ciruga para cerrar la fstula. INSTRUCCIONES PARA EL CUIDADO EN EL HOGAR  La higiene diaria normal y [address] regular de apsitos o paales para adultos cambiados con regularidad ayudan a prevenir olores y daos en la piel.  Evite la cafena. Puede sobreestimular la vejiga.  Use el bao con regularidad. Trate de ir al bao cada 2  3  horas, aunque no sienta la necesidad. Tmese el tiempo para vaciar la vejiga completamente. Despus de orinar, espere un minuto. Luego trate de orinar nuevamente.  Para las causas que implican una disfuncin nerviosa, lleve un registro de los medicamentos que toma y un diario de las veces que va al bao. SOLICITE ATENCIN MDICA SI:  Despus del procedimiento, experimenta un empeoramiento del dolor en vez de mejorar.  La incontinencia empeora en vez de mejorar. SOLICITE ATENCIN MDICA DE INMEDIATO SI:  Le sube la fiebre o tiene escalofros.  No puede orinar.  Observa irritacin en la ingle o baja hacia los muslos. ASEGRESE DE QUE:  Comprende estas instrucciones.  Controlar su afeccin.  Recibir ayuda de inmediato si no mejora o si empeora. Esta informacin no tiene Theme park manager el consejo del mdico. Asegrese de hacerle al mdico cualquier pregunta que tenga. Document Released: 02/26/2005 Document Revised: 03/19/2014 Document Reviewed: 08/05/2012 Elsevier Interactive Patient Education  [employer].

## 2016-12-04 LAB — CBC WITH DIFFERENTIAL/PLATELET
BASOS PCT: 0.4 %
Basophils Absolute: 27 cells/uL (ref 0–200)
EOS ABS: 82 {cells}/uL (ref 15–500)
EOS PCT: 1.2 %
HEMATOCRIT: 36 % (ref 35.0–45.0)
HEMOGLOBIN: 12.3 g/dL (ref 11.7–15.5)
LYMPHS ABS: 2496 {cells}/uL (ref 850–3900)
MCH: 30.8 pg (ref 27.0–33.0)
MCHC: 34.2 g/dL (ref 32.0–36.0)
MCV: 90.2 fL (ref 80.0–100.0)
MONOS PCT: 5.6 %
MPV: 11.6 fL (ref 7.5–12.5)
NEUTROS ABS: 3815 {cells}/uL (ref 1500–7800)
Neutrophils Relative %: 56.1 %
PLATELETS: 218 10*3/uL (ref 140–400)
RBC: 3.99 10*6/uL (ref 3.80–5.10)
RDW: 12.1 % (ref 11.0–15.0)
Total Lymphocyte: 36.7 %
WBC mixed population: 381 cells/uL (ref 200–950)
WBC: 6.8 10*3/uL (ref 3.8–10.8)

## 2016-12-04 LAB — BASIC METABOLIC PANEL WITH GFR
BUN: 19 mg/dL (ref 7–25)
CHLORIDE: 105 mmol/L (ref 98–110)
CO2: 25 mmol/L (ref 20–32)
Calcium: 9.2 mg/dL (ref 8.6–10.4)
Creat: 0.66 mg/dL (ref 0.50–1.05)
GFR, EST NON AFRICAN AMERICAN: 100 mL/min/{1.73_m2} (ref >=60)
GFR, Est African American: 116 mL/min/{1.73_m2} (ref >=60)
GLUCOSE: 98 mg/dL (ref 65–99)
POTASSIUM: 4 mmol/L (ref 3.5–5.3)
SODIUM: 139 mmol/L (ref 135–146)

## 2016-12-04 LAB — LIPID PANEL
CHOLESTEROL: 193 mg/dL (ref <200)
HDL: 42 mg/dL — AB (ref >50)
LDL CHOLESTEROL (CALC): 129 mg/dL — AB
Non-HDL Cholesterol (Calc): 151 mg/dL (calc) — ABNORMAL HIGH (ref <130)
Total CHOL/HDL Ratio: 4.6 (calc) (ref <5.0)
Triglycerides: 113 mg/dL (ref <150)

## 2016-12-04 LAB — HEMOGLOBIN A1C
HEMOGLOBIN A1C: 5.8 %{Hb} — AB (ref <5.7)
Mean Plasma Glucose: 120 (calc)
eAG (mmol/L): 6.6 (calc)

## 2016-12-04 LAB — HEPATIC FUNCTION PANEL
AG Ratio: 1.8 (calc) (ref 1.0–2.5)
ALKALINE PHOSPHATASE (APISO): 70 U/L (ref 33–130)
ALT: 32 U/L — AB (ref 6–29)
AST: 23 U/L (ref 10–35)
Albumin: 4.3 g/dL (ref 3.6–5.1)
BILIRUBIN TOTAL: 0.6 mg/dL (ref 0.2–1.2)
Bilirubin, Direct: 0.1 mg/dL (ref 0.0–0.2)
Globulin: 2.4 g/dL (calc) (ref 1.9–3.7)
Indirect Bilirubin: 0.5 mg/dL (calc) (ref 0.2–1.2)
TOTAL PROTEIN: 6.7 g/dL (ref 6.1–8.1)

## 2016-12-04 LAB — TSH: TSH: 5.14 mIU/L — ABNORMAL HIGH

## 2016-12-04 LAB — INSULIN, RANDOM: Insulin: 5.6 u[IU]/mL (ref 2.0–19.6)

## 2016-12-04 LAB — MAGNESIUM: Magnesium: 1.7 mg/dL (ref 1.5–2.5)

## 2016-12-04 NOTE — Progress Notes (Signed)
Pt aware of lab results & voiced understanding of those results.

## 2017-04-30 ENCOUNTER — Encounter: Payer: Self-pay | Admitting: Internal Medicine

## 2017-05-02 NOTE — Progress Notes (Deleted)
Complete Physical  Assessment and Plan:    Discussed med's effects and SE's. Screening labs and tests as requested with regular follow-up as recommended.  HPI  57 y.o. female  presents for a complete physical.  Her blood pressure has been controlled at home, today their BP is  .  She does not workout. She denies chest pain, shortness of breath, dizziness.   She is on cholesterol medication and denies myalgias. Her cholesterol is not at goal. The cholesterol last visit was:  Lab Results  Component Value Date   CHOL 193 12/03/2016   HDL 42 (L) 12/03/2016   LDLCALC 139 (H) 04/30/2016   TRIG 113 12/03/2016   CHOLHDL 4.6 12/03/2016  . She has been working on diet and exercise for prediabetes, she is on bASA, she is not on ACE/ARB and denies foot ulcerations, hyperglycemia, hypoglycemia , increased appetite, nausea, paresthesia of the feet, polydipsia, polyuria, visual disturbances, vomiting and weight loss. Last A1C in the office was:  Lab Results  Component Value Date   HGBA1C 5.8 (H) 12/03/2016   Patient is on Vitamin D supplement.   Lab Results  Component Value Date   VD25OH 36 04/30/2016     She reports that her moods are much better than last time.  She reports that she feels better.  She reports that she is able to relax more.  She reports that she has not had any further problems.    She is on thyroid medication. Her medication {WAS/WAS NOT:[id number]::"was not"} changed last visit.   Lab Results  Component Value Date   TSH 5.14 (H) 12/03/2016  .  BMI is There is no height or weight on file to calculate BMI., she is working on diet and exercise. Wt Readings from Last 3 Encounters:  12/03/16 183 lb 3.2 oz (83.1 kg)  04/30/16 188 lb (85.3 kg)  09/19/15 184 lb (83.5 kg)    Current Medications:  Current Outpatient Medications on File Prior to Visit  Medication Sig Dispense Refill  . buPROPion (WELLBUTRIN XL) 150 MG 24 hr tablet Take 1 tablet (150 mg total) by mouth  every morning. 90 tablet 2  . Cholecalciferol (VITAMIN D PO) Take 5,000 Int'l Units by mouth daily.    . IRON PO Take by mouth daily.    Marland Kitchen levothyroxine (SYNTHROID, LEVOTHROID) 137 MCG tablet TAKE ONE TABLET BY MOUTH ONCE DAILY BEFORE BREAKFAST 30 tablet 0  . neomycin-polymyxin-hydrocortisone (CORTISPORIN) otic solution     . OVER THE COUNTER MEDICATION daily. Potassium- pt unsure of dose    . traZODone (DESYREL) 50 MG tablet Take 1 tablet (50 mg total) by mouth at bedtime. 90 tablet 0   No current facility-administered medications on file prior to visit.     Health Maintenance:   Immunization History  Administered Date(s) Administered  . Influenza Split 02/08/2014  . Influenza, Seasonal, Injecte, Preservative Fre 02/21/2015  . Td 03/12/2002  . Tdap 02/08/2014    Tetanus: 2015 Flu vaccine: 2016, declined Pap:November 2015 MGM:  Colonoscopy: 2015  Patient Care Team: Lucky Cowboy, MD as PCP - General (Internal Medicine) Dorisann Frames, MD as Consulting Physician (Endocrinology)  Medical History:  Past Medical History:  Diagnosis Date  . Cholelithiasis   . Depression   . Fibrocystic breast disease   . GERD (gastroesophageal reflux disease)   . Hyperlipidemia   . Hypothyroidism   . Labile hypertension   . Migraine   . Prediabetes     Allergies No Known Allergies  SURGICAL HISTORY She  has a past surgical history that includes thyroid ablation (2008). FAMILY HISTORY Her family history includes Aneurysm in her sister; Heart attack (age of onset: 2335) in her paternal grandfather; Hyperlipidemia in her father; Hypertension in her mother. SOCIAL HISTORY She  reports that  has never smoked. she has never used smokeless tobacco. She reports that she drinks alcohol. She reports that she does not use drugs. ' Review of Systems: Review of Systems  Constitutional: Negative for chills, fever and malaise/fatigue.  HENT: Negative for congestion, ear pain and sore throat.    Eyes: Negative.   Respiratory: Negative for cough, shortness of breath and wheezing.   Cardiovascular: Negative for chest pain, palpitations and leg swelling.  Gastrointestinal: Negative for abdominal pain, blood in stool, constipation, diarrhea, heartburn and melena.  Genitourinary: Negative.   Skin: Negative.   Neurological: Negative for dizziness, sensory change, loss of consciousness and headaches.  Psychiatric/Behavioral: Negative for depression. The patient is not nervous/anxious and does not have insomnia.     Physical Exam: Estimated body mass index is 29.57 kg/m as calculated from the following:   Height as of 12/03/16: 5\' 6"  (1.676 m).   Weight as of 12/03/16: 183 lb 3.2 oz (83.1 kg). There were no vitals taken for this visit.  Wt Readings from Last 3 Encounters:  12/03/16 183 lb 3.2 oz (83.1 kg)  04/30/16 188 lb (85.3 kg)  09/19/15 184 lb (83.5 kg)    General Appearance: Well nourished well developed, in no apparent distress.  Eyes: PERRLA, EOMs, conjunctiva no swelling or erythema ENT/Mouth: Ear canals normal without obstruction, swelling, erythema, or discharge.  TMs normal bilaterally with no erythema, bulging, retraction, or loss of landmark.  Oropharynx moist and clear with no exudate, erythema, or swelling.   Neck: Supple, thyroid normal. No bruits.  No cervical adenopathy Respiratory: Respiratory effort normal, Breath sounds clear A&P without wheeze, rhonchi, rales.   Cardio: RRR without murmurs, rubs or gallops. Brisk peripheral pulses without edema.  Chest: symmetric, with normal excursions Breasts: Symmetric, without lumps, nipple discharge, retractions. Single dark 3 mm round symmetric nevi to the right axillary region Abdomen: Soft, nontender, no guarding, rebound, hernias, masses, or organomegaly.  Lymphatics: Non tender without lymphadenopathy.  Genitourinary: Declined by patient, due for pap in 2018 Musculoskeletal: Full ROM all peripheral extremities,5/5  strength, and normal gait.  Skin: Warm, dry without rashes, lesions, ecchymosis. Neuro: Awake and oriented X 3, Cranial nerves intact, reflexes equal bilaterally. Normal muscle tone, no cerebellar symptoms. Sensation intact.  Psych:  normal affect, Insight and Judgment appropriate.   EKG: WNL no changes.   Over 40 minutes of exam, counseling, chart review and critical decision making was performed  Robin Montes 9:39 AM [facility]

## 2017-05-06 ENCOUNTER — Encounter: Payer: Self-pay | Admitting: Internal Medicine

## 2017-05-06 ENCOUNTER — Encounter: Payer: Self-pay | Admitting: Physician Assistant

## 2017-07-22 ENCOUNTER — Encounter: Payer: Self-pay | Admitting: Physician Assistant

## 2017-07-29 ENCOUNTER — Encounter: Payer: Self-pay | Admitting: Physician Assistant

## 2017-08-11 DIAGNOSIS — G4700 Insomnia, unspecified: Secondary | ICD-10-CM | POA: Insufficient documentation

## 2017-08-11 HISTORY — DX: Insomnia, unspecified: G47.00

## 2017-08-11 NOTE — Progress Notes (Signed)
Complete Physical  Assessment and Plan:  Diagnoses and all orders for this visit:  Encounter for routine adult health examination without abnormal findings  Gastroesophageal reflux disease, esophagitis presence not specified Well managed by lifestyle/PRN OTC agents Discussed diet, avoiding triggers and other lifestyle changes  Hypothyroidism, unspecified type continue medications the same pending lab results reminded to take on an empty stomach 30-47mins before food.  check TSH level  Vitamin D deficiency Continue supplementation Check vitamin D level  Prediabetes Discussed disease and risks Discussed diet/exercise, weight management  A1C  Overweight (BMI 25.0-29.9) Long discussion about weight loss, diet, and exercise Recommended diet heavy in fruits and veggies and low in animal meats, cheeses, and dairy products, appropriate calorie intake Discussed appropriate weight for height  Follow up at next visit  Mixed hyperlipidemia Currently with mild elevations; attempting lifestyle modification; initiate statin as indicated Continue low cholesterol diet and exercise.  Check lipid panel.   Depression, major, in remission (HCC) Continue medications  Lifestyle discussed: diet/exerise, sleep hygiene, stress management, hydration  Insomnia, unspecified type Good sleep hygiene discussed, increase day time activity Doing well with trazodone   Screening breast cancer + family history, will schedule for her due to language barrier  Cervical cancer screening Overdue PAP; no hx; will do with HPV check today  Orders Placed This Encounter  Procedures  . MM Digital Screening  . CBC with Differential/Platelet  . COMPLETE METABOLIC PANEL WITH GFR  . Lipid panel  . TSH  . Hemoglobin A1c  . VITAMIN D 25 Hydroxy (Vit-D Deficiency, Fractures)  . Urinalysis w microscopic + reflex cultur  . Vitamin B12  . Microalbumin / creatinine urine ratio  . EKG 12-Lead    Discussed  med's effects and SE's. Screening labs and tests as requested with regular follow-up as recommended. Over 40 minutes of exam, counseling, chart review, and complex, high level critical decision making was performed this visit.   Future Appointments  Date Time Provider Department Center  08/18/2018  9:00 AM [name], PA-C [facility] None     HPI  57 y.o. female. 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 She reports her mother was recently diagnosed with breast cancer; she reports she has had a mammogram several years ago; she is also overdue for PAP.   BMI is Body mass index is 30.83 kg/m., she has been working on diet and exercise. Wt Readings from Last 3 Encounters:  08/12/17 191 lb (86.6 kg)  12/03/16 183 lb 3.2 oz (83.1 kg)  04/30/16 188 lb (85.3 kg)   Today their BP is BP: 128/82 She does workout. She denies chest pain, shortness of breath, dizziness.   She is not on cholesterol medication and denies myalgias. Her cholesterol is not at goal. The cholesterol last visit was:   Lab Results  Component Value Date   CHOL 193 12/03/2016   HDL 42 (L) 12/03/2016   LDLCALC 129 (H) 12/03/2016   TRIG 113 12/03/2016   CHOLHDL 4.6 12/03/2016   She has been working on diet and exercise for prediabetes, she is not on bASA, she is not on ACE/ARB (BP controlled) and denies foot ulcerations, increased appetite, nausea, paresthesia of the feet, polydipsia, polyuria, visual disturbances, vomiting and weight loss. Last A1C in the office was:  Lab Results  Component  Value Date   HGBA1C 5.8 (H) 12/03/2016   She is on thyroid medication. Her medication was changed last visit.   Lab Results  Component Value  Date   TSH 5.14 (H) 12/03/2016   Last GFR: Lab Results  Component Value Date   GFRNONAA 100 12/03/2016   Patient is on Vitamin D supplement.   Lab Results  Component Value Date   VD25OH 36 04/30/2016      Current Medications:  Current Outpatient Medications on File Prior to Visit  Medication Sig Dispense Refill  . Cholecalciferol (VITAMIN D PO) Take 5,000 Int'l Units by mouth daily.    . IRON PO Take by mouth daily.    Marland Kitchen levothyroxine (SYNTHROID, LEVOTHROID) 137 MCG tablet TAKE ONE TABLET BY MOUTH ONCE DAILY BEFORE BREAKFAST (Patient taking differently: Take by mouth once daily) 30 tablet 0  . OVER THE COUNTER MEDICATION daily. Potassium- pt unsure of dose    . traZODone (DESYREL) 50 MG tablet Take 1 tablet (50 mg total) by mouth at bedtime. (Patient taking differently: Take 50 mg by mouth at bedtime as needed. ) 90 tablet 0  . buPROPion (WELLBUTRIN XL) 150 MG 24 hr tablet Take 1 tablet (150 mg total) by mouth every morning. (Patient not taking: Reported on 08/12/2017) 90 tablet 2  . neomycin-polymyxin-hydrocortisone (CORTISPORIN) otic solution      No current facility-administered medications on file prior to visit.    Allergies:  No Known Allergies Medical History:  She has Prediabetes; Hyperlipidemia; Vitamin D deficiency; Hypothyroidism; Depression, major, in remission (HCC); GERD (gastroesophageal reflux disease); Medication management; Overweight (BMI 25.0-29.9); and Insomnia on their problem list. Health Maintenance:   Immunization History  Administered Date(s) Administered  . Influenza Split 02/08/2014  . Influenza, Seasonal, Injecte, Preservative Fre 02/21/2015  . Td 03/12/2002  . Tdap 02/08/2014   Tetanus: 2015  Flu vaccine: 2016, currently n/a Shingrix: declines  LMP: No LMP recorded. (Menstrual status: Perimenopausal). Pap: 01/2014, DUE MGM: ?, Will order to schedule today due to language barrier  Colonoscopy: 2015? can't find report - patient reports  she has documentation, will present/mail EGD: n/a  Last Dental Exam: 2019, goes q6 month Last Eye Exam: Remote, will schedule  Patient Care Team: Lucky Cowboy, MD as PCP - General (Internal Medicine) Dorisann Frames, MD as Consulting Physician (Endocrinology)  Surgical History:  She has a past surgical history that includes thyroid ablation (2008). Family History:  Herfamily history includes Aneurysm in her brother and sister; Arthritis in her sister; Breast cancer in her mother; Heart attack (age of onset: 65) in her paternal grandfather; Hyperlipidemia in her father and mother; Hypertension in her mother. Social History:  She reports that she has never smoked. She has never used smokeless tobacco. She reports that she drinks alcohol. She reports that she does not use drugs.  Review of Systems: Review of Systems  Constitutional: Negative for malaise/fatigue and weight loss.  HENT: Negative for hearing loss and tinnitus.   Eyes: Negative for blurred vision and double vision.  Respiratory: Negative for cough, sputum production, shortness of breath and wheezing.   Cardiovascular: Negative for chest pain, palpitations, orthopnea, claudication, leg swelling and PND.  Gastrointestinal: Negative for abdominal pain, blood in stool, constipation, diarrhea, heartburn, melena, nausea and vomiting.  Genitourinary: Negative.   Musculoskeletal: Negative for falls, joint pain and myalgias.  Skin: Negative for rash.  Neurological: Negative for dizziness, tingling, sensory change, weakness and headaches.  Endo/Heme/Allergies: Negative for polydipsia.  Psychiatric/Behavioral: Negative.  Negative for depression,  memory loss, substance abuse and suicidal ideas. The patient is not nervous/anxious and does not have insomnia.   All other systems reviewed and are negative.   Physical Exam: Estimated body mass index is 30.83 kg/m as calculated from the following:   Height as of this encounter: 5\' 6"   (1.676 m).   Weight as of this encounter: 191 lb (86.6 kg). BP 128/82   Pulse 81   Temp (!) 97.5 F (36.4 C)   Ht 5\' 6"  (1.676 m)   Wt 191 lb (86.6 kg)   SpO2 98%   BMI 30.83 kg/m  General Appearance: Well nourished, in no apparent distress.  Eyes: PERRLA, EOMs, conjunctiva no swelling or erythema, normal fundi and vessels.  Sinuses: No Frontal/maxillary tenderness  ENT/Mouth: Ext aud canals clear, normal light reflex with TMs without erythema, bulging. Good dentition. No erythema, swelling, or exudate on post pharynx. Tonsils not swollen or erythematous. Hearing normal.  Neck: Supple, thyroid normal. No bruits  Respiratory: Respiratory effort normal, BS equal bilaterally without rales, rhonchi, wheezing or stridor.  Cardio: RRR without murmurs, rubs or gallops. Brisk peripheral pulses without edema.  Chest: symmetric, with normal excursions and percussion.  Breasts: Symmetric, without lumps, nipple discharge, retractions.  Abdomen: Soft, nontender, no guarding, rebound, hernias, masses, or organomegaly.  Lymphatics: Non tender without lymphadenopathy.  Genitourinary: Normal internal and external structures Musculoskeletal: Full ROM all peripheral extremities,5/5 strength, and normal gait.  Skin: Warm, dry without rashes, lesions, ecchymosis. Neuro: Cranial nerves intact, reflexes equal bilaterally. Normal muscle tone, no cerebellar symptoms. Sensation intact.  Psych: Awake and oriented X 3, normal affect, Insight and Judgment appropriate.   EKG: WNL no ST changes.  Carlyon ShadowAshley C Shaneika Rossa 3:07 PM [facility]

## 2017-08-12 ENCOUNTER — Ambulatory Visit: Payer: BLUE CROSS/BLUE SHIELD | Admitting: Adult Health

## 2017-08-12 ENCOUNTER — Other Ambulatory Visit (HOSPITAL_COMMUNITY)
Admission: RE | Admit: 2017-08-12 | Discharge: 2017-08-12 | Disposition: A | Discharge status: Home / Self Care | Payer: BLUE CROSS/BLUE SHIELD | Source: Ambulatory Visit | Attending: Adult Health | Admitting: Adult Health

## 2017-08-12 ENCOUNTER — Encounter: Payer: Self-pay | Admitting: Adult Health

## 2017-08-12 VITALS — BP 128/82 | HR 81 | Temp 97.5°F | Ht 66.0 in | Wt 191.0 lb

## 2017-08-12 DIAGNOSIS — Z13220 Encounter for screening for lipoid disorders: Secondary | ICD-10-CM

## 2017-08-12 DIAGNOSIS — Z1329 Encounter for screening for other suspected endocrine disorder: Secondary | ICD-10-CM

## 2017-08-12 DIAGNOSIS — Z1239 Encounter for other screening for malignant neoplasm of breast: Secondary | ICD-10-CM

## 2017-08-12 DIAGNOSIS — Z124 Encounter for screening for malignant neoplasm of cervix: Secondary | ICD-10-CM | POA: Insufficient documentation

## 2017-08-12 DIAGNOSIS — Z131 Encounter for screening for diabetes mellitus: Secondary | ICD-10-CM

## 2017-08-12 DIAGNOSIS — Z130 Encounter for screening for diseases of the blood and blood-forming organs and certain disorders involving the immune mechanism: Secondary | ICD-10-CM | POA: Diagnosis not present

## 2017-08-12 DIAGNOSIS — F325 Major depressive disorder, single episode, in full remission: Secondary | ICD-10-CM

## 2017-08-12 DIAGNOSIS — Z1389 Encounter for screening for other disorder: Secondary | ICD-10-CM | POA: Diagnosis not present

## 2017-08-12 DIAGNOSIS — R7303 Prediabetes: Secondary | ICD-10-CM

## 2017-08-12 DIAGNOSIS — E039 Hypothyroidism, unspecified: Secondary | ICD-10-CM

## 2017-08-12 DIAGNOSIS — I10 Essential (primary) hypertension: Secondary | ICD-10-CM | POA: Diagnosis not present

## 2017-08-12 DIAGNOSIS — Z136 Encounter for screening for cardiovascular disorders: Secondary | ICD-10-CM

## 2017-08-12 DIAGNOSIS — K219 Gastro-esophageal reflux disease without esophagitis: Secondary | ICD-10-CM

## 2017-08-12 DIAGNOSIS — E782 Mixed hyperlipidemia: Secondary | ICD-10-CM

## 2017-08-12 DIAGNOSIS — E559 Vitamin D deficiency, unspecified: Secondary | ICD-10-CM

## 2017-08-12 DIAGNOSIS — Z0000 Encounter for general adult medical examination without abnormal findings: Secondary | ICD-10-CM

## 2017-08-12 DIAGNOSIS — Z79899 Other long term (current) drug therapy: Secondary | ICD-10-CM

## 2017-08-12 DIAGNOSIS — E663 Overweight: Secondary | ICD-10-CM

## 2017-08-12 DIAGNOSIS — G4700 Insomnia, unspecified: Secondary | ICD-10-CM

## 2017-08-12 NOTE — Patient Instructions (Addendum)
Need information about colonoscopy - has she had one? Need report  Need to schedule eye exam   Aim for 7+ servings of fruits and vegetables daily  80+ fluid ounces of water or unsweet tea for healthy kidneys  Limit alcohol intake  Limit animal fats in diet for cholesterol and heart health - choose grass fed whenever available  Aim for low stress - take time to unwind and care for your mental health  Aim for 150 min of moderate intensity exercise weekly for heart health, and weights twice weekly for bone health  Aim for 7-9 hours of sleep daily     Prevenir el aumento de peso no saludable, en adultos Preventing Unhealthy Weight Gain, Adult Mantener un peso saludable es importante. Cuando la grasa se acumula en el cuerpo, usted puede convertirse en obeso o tener sobrepeso. Estos problemas hacen que tenga un mayor riesgo de desarrollar ciertos problemas de salud, como enfermedad cardaca, diabetes, problemas para dormir, problemas articulares y ciertos tipos de cnceres. El aumento de peso no saludable suele ser es el resultado de tomar decisiones poco saludables en lo que respecta a lo que come. Tambin es el resultado de no hacer suficiente ejercicio fsico. Puede hacer cambios en su estilo de vida para prevenir la obesidad y mantenerse tan saludable como sea posible. Qu cambios nutricionales se pueden realizar? Para mantener un peso saludable y prevenir la obesidad:  Coma solo la cantidad que el cuerpo necesita. Haga lo siguiente: ? Preste atencin a los signos de que tiene hambre o que se siente satisfecho. Deje de comer tan pronto como se sienta satisfecho. ? Si tiene hambre, primero beba agua. Beba suficiente agua para que la orina sea clara o de color amarillo plido. ? Coma porciones ms pequeas. ? Consulte los tamaos de las porciones en las etiquetas de los alimentos. La mayora de los alimentos contiene ms de una porcin por envase. ? Consuma la cantidad recomendada de  caloras segn su sexo y [place]. Si bien la Harley[employer]son de las personas activas deben consumir alrededor de 2000 caloras por da, si ests tratando de perder peso o si no es muy [place], debe consumir menos caloras. Hable con el mdico o el nutricionista sobre cuntas caloras debe consumir por Futures trader.  Elija alimentos saludables, por ejemplo: ? Frutas y verduras. Trate de que al Coca-Cola mitad del plato de cada comida sea de frutas y verduras. ? Cereales integrales, como el pan integral, el arroz integral y Sport and exercise psychologist. ? Carnes magras, como la carne de ave o pescado. ? Otras protenas saludables, como los frijoles, los huevos o el tofu. ? Grasas saludables, como las nueces, las semillas, los pescados grasos y el aceite de [place]. ? Productos lcteos con bajo contenido de [place] o sin grasa.  Revise las etiquetas de los alimentos y evite los alimentos y bebidas que: ? Contienen muchas caloras. ? Tienen agregados de International aid/development worker. ? Contienen Performance Food Group. ? Tienen grasas saturadas o grasas trans.  Limite la cantidad que consume de los siguientes alimentos: ? Comidas envasadas. ? Comida rpida. ? Comidas fritas. ? Carnes procesadas, como tocino, salchicha y fiambres. ? Cortes grasos de carne de res y carne de ave con piel.  Cocine los alimentos en formas ms saludables, tales como hornear, Technical sales engineer o asar a la parrilla.  Cuando vaya de compras, intente mantenerse fuera de los pasillos de la tienda. Esto lo ayudar a comprar una mayor cantidad de alimentos frescos y Automotive engineer los alimentos enlatados y  preenvasados.  Qu cambios en el estilo de vida se pueden realizar?  Haga al menos de ejercicio 5o ms Masco Corporation. Hacer ejercicio incluye caminar rpido, trabajar en el jardn, andar en bicicleta, correr, nadar y practicar deportes en equipo como baloncesto y ftbol. Consulte al mdico qu ejercicios son seguros para usted.  No consuma ningn producto que contenga nicotina o  tabaco, como cigarrillos y Administrator, Civil Service. Si necesita ayuda para dejar de fumar, consulte al mdico.  Limite el consumo de alcohol a no ms de por da si es mujer y no est [place], y por da si es hombre. Una medida equivale a 12oz ( ) de cerveza, 5oz ( ) de vino o 1oz (44ml) de bebidas alcohlicas de alta graduacin.  Trate de dormir entre 7 y 9horas todas las noches. Qu otros cambios se pueden realizar?  Lleve un registro de los [employer]oco[employer]ips come y las actividades que realiza para Chief Operating Officer: ? Lo que comi y cuntas caloras tena. Recuerde [employer] Brands, los aderezos y las guarniciones. ? Si estuvo activo y qu ejercicios hizo. ? Sus objetivos de [place], [place] y [place].  Controle su peso regularmente. Haga un seguimiento de los [place]. Si observa que ha subido de [place], haga cambios en su dieta o [place].  Evite tomar medicamentos o suplementos para perder peso. Hable con el mdico antes de empezar cualquier medicamento o suplemento nuevo.  Hable con el mdico antes de probar una dieta nueva o un plan nuevo de actividad fsica. Por qu son importantes estos cambios? Comer sano, mantenerse activo y tener hbitos saludables no solo ayuda a prevenir la obesidad, sino tambin:  Le resultar ms sencillo controlar el estrs y las emociones.  Le resultar ms sencillo sentirse conectado con amigos y familiares.  Mejorar su autoestima.  Mejorar el sueo.  Prevendr problemas de salud a Air cabin crew.  Qu puede suceder si no se hacen cambios? Ser obeso o tener sobrepeso puede hacer que desarrolle problemas en las articulaciones o en los [place], lo que puede dificultarle mantenerse activo o Education officer, environmental actividades que disfruta. Ser obeso o tener sobrepeso hace que el corazn y los pulmones se esfuercen ms para Printmaker, y [employer] de salud, como diabetes, enfermedad cardaca y ciertos tipos de  cnceres. Dnde encontrar ms informacin: Hable con su mdico o un dietista sobre opciones de alimentacin saludable y estilo de vida saludable. Tambin puede encontrar ms informacin a travs de estos recursos:  MyPlate del Departamento de Estate manager/land agent de los Estados FabulousVibe.fi  Asociacin Estadounidense del [place] (American Heart Association): www.heart.org  Centers for Disease Control and Prevention (Centros para el control y la prevencin de enfermedades, CDC): FootballExhibition.com.br  Resumen  Mantener un peso saludable es importante. Ayuda a prevenir ciertas enfermedades y problemas de salud, como enfermedad cardaca, diabetes, trastornos del sueo, problemas articulares y ciertos tipos de cnceres.  Ser obeso o tener sobrepeso puede hacer que desarrolle problemas en las articulaciones o en los [place], lo que puede dificultarle mantenerse activo o Education officer, environmental actividades que disfruta.  Puede prevenir el aumento de peso no saludable al llevar una dieta saludable, hacer ejercicio regularmente, no fumar, limitar el consumo de alcohol y dormir lo suficiente.  Hable con su mdico o un dietista para obtener asesoramiento sobre opciones de alimentacin saludable y estilo de vida saludable. Esta informacin no tiene Theme park manager el consejo del mdico. Asegrese de hacerle al mdico cualquier pregunta que tenga. Document Released: 07/06/2016 Document Revised: 07/06/2016 Document Reviewed: 07/06/2016  Risk analystlsevier Interactive Patient Education  [employer]2018 Elsevier Inc.

## 2017-08-13 ENCOUNTER — Encounter: Payer: Self-pay | Admitting: Adult Health

## 2017-08-13 ENCOUNTER — Other Ambulatory Visit: Payer: Self-pay | Admitting: Adult Health

## 2017-08-13 DIAGNOSIS — K760 Fatty (change of) liver, not elsewhere classified: Secondary | ICD-10-CM | POA: Insufficient documentation

## 2017-08-13 LAB — CBC WITH DIFFERENTIAL/PLATELET
BASOS ABS: 22 {cells}/uL (ref 0–200)
Basophils Relative: 0.3 %
EOS ABS: 72 {cells}/uL (ref 15–500)
Eosinophils Relative: 1 %
HCT: 38.5 % (ref 35.0–45.0)
Hemoglobin: 13.4 g/dL (ref 11.7–15.5)
Lymphs Abs: 2254 cells/uL (ref 850–3900)
MCH: 30.7 pg (ref 27.0–33.0)
MCHC: 34.8 g/dL (ref 32.0–36.0)
MCV: 88.1 fL (ref 80.0–100.0)
MONOS PCT: 5 %
MPV: 12 fL (ref 7.5–12.5)
NEUTROS PCT: 62.4 %
Neutro Abs: 4493 cells/uL (ref 1500–7800)
Platelets: 204 10*3/uL (ref 140–400)
RBC: 4.37 10*6/uL (ref 3.80–5.10)
RDW: 12 % (ref 11.0–15.0)
Total Lymphocyte: 31.3 %
WBC: 7.2 10*3/uL (ref 3.8–10.8)
WBCMIX: 360 {cells}/uL (ref 200–950)

## 2017-08-13 LAB — URINALYSIS W MICROSCOPIC + REFLEX CULTURE
BACTERIA UA: NONE SEEN /HPF
Bilirubin Urine: NEGATIVE
GLUCOSE, UA: NEGATIVE
HYALINE CAST: NONE SEEN /LPF
Hgb urine dipstick: NEGATIVE
Leukocyte Esterase: NEGATIVE
Nitrites, Initial: NEGATIVE
PH: 5.5 (ref 5.0–8.0)
Protein, ur: NEGATIVE
RBC / HPF: NONE SEEN /HPF (ref 0–2)
SPECIFIC GRAVITY, URINE: 1.032 (ref 1.001–1.03)
WBC, UA: NONE SEEN /HPF (ref 0–5)

## 2017-08-13 LAB — COMPLETE METABOLIC PANEL WITH GFR
AG RATIO: 2 (calc) (ref 1.0–2.5)
ALKALINE PHOSPHATASE (APISO): 85 U/L (ref 33–130)
ALT: 51 U/L — AB (ref 6–29)
AST: 33 U/L (ref 10–35)
Albumin: 4.7 g/dL (ref 3.6–5.1)
BILIRUBIN TOTAL: 0.5 mg/dL (ref 0.2–1.2)
BUN: 20 mg/dL (ref 7–25)
CHLORIDE: 105 mmol/L (ref 98–110)
CO2: 28 mmol/L (ref 20–32)
Calcium: 9.4 mg/dL (ref 8.6–10.4)
Creat: 0.85 mg/dL (ref 0.50–1.05)
GFR, EST AFRICAN AMERICAN: 89 mL/min/{1.73_m2} (ref >=60)
GFR, Est Non African American: 77 mL/min/{1.73_m2} (ref >=60)
GLUCOSE: 113 mg/dL — AB (ref 65–99)
Globulin: 2.3 g/dL (calc) (ref 1.9–3.7)
POTASSIUM: 3.8 mmol/L (ref 3.5–5.3)
Sodium: 140 mmol/L (ref 135–146)
TOTAL PROTEIN: 7 g/dL (ref 6.1–8.1)

## 2017-08-13 LAB — LIPID PANEL
CHOLESTEROL: 214 mg/dL — AB (ref <200)
HDL: 40 mg/dL — ABNORMAL LOW (ref >50)
LDL CHOLESTEROL (CALC): 142 mg/dL — AB
Non-HDL Cholesterol (Calc): 174 mg/dL (calc) — ABNORMAL HIGH (ref <130)
TRIGLYCERIDES: 181 mg/dL — AB (ref <150)
Total CHOL/HDL Ratio: 5.4 (calc) — ABNORMAL HIGH (ref <5.0)

## 2017-08-13 LAB — TSH: TSH: 1.2 mIU/L

## 2017-08-13 LAB — MICROALBUMIN / CREATININE URINE RATIO
Creatinine, Urine: 226 mg/dL (ref 20–275)
Microalb Creat Ratio: 3 mcg/mg creat (ref <30)
Microalb, Ur: 0.7 mg/dL

## 2017-08-13 LAB — HEMOGLOBIN A1C
EAG (MMOL/L): 6.8 (calc)
HEMOGLOBIN A1C: 5.9 %{Hb} — AB (ref <5.7)
MEAN PLASMA GLUCOSE: 123 (calc)

## 2017-08-13 LAB — VITAMIN B12: Vitamin B-12: 368 pg/mL (ref 200–1100)

## 2017-08-13 LAB — NO CULTURE INDICATED

## 2017-08-13 LAB — VITAMIN D 25 HYDROXY (VIT D DEFICIENCY, FRACTURES): Vit D, 25-Hydroxy: 32 ng/mL (ref 30–100)

## 2017-08-13 MED ORDER — ATORVASTATIN CALCIUM 10 MG PO TABS: 10.0000 mg | ORAL_TABLET | Freq: Every day | ORAL | 1 refills | Status: DC

## 2017-08-14 LAB — CYTOLOGY - PAP
Diagnosis: NEGATIVE
HPV: NOT DETECTED

## 2017-08-20 ENCOUNTER — Other Ambulatory Visit: Payer: Self-pay

## 2017-08-20 MED ORDER — LEVOTHYROXINE SODIUM 137 MCG PO TABS: ORAL_TABLET | ORAL | 0 refills | Status: DC

## 2017-08-20 NOTE — Telephone Encounter (Signed)
Levothyroxine refill request.

## 2017-09-15 ENCOUNTER — Other Ambulatory Visit: Payer: Self-pay | Admitting: Adult Health

## 2017-11-18 ENCOUNTER — Ambulatory Visit: Payer: BLUE CROSS/BLUE SHIELD

## 2017-12-02 ENCOUNTER — Ambulatory Visit: Payer: Self-pay | Admitting: Adult Health

## 2017-12-02 DIAGNOSIS — Z1231 Encounter for screening mammogram for malignant neoplasm of breast: Secondary | ICD-10-CM | POA: Diagnosis not present

## 2017-12-02 LAB — HM MAMMOGRAPHY

## 2017-12-21 NOTE — Progress Notes (Deleted)
FOLLOW UP  Assessment and Plan:    Major Depression currently in remission  Continue to monitor; use trazodone as needed. Call if you begin experiencing symptoms again and we can restart wellbutrin or switch to an agent that will also help with hot flashes.   Cholesterol  Discussed possibly initiating a statin pending today's lab results  Continue diet and exercise.   Check lipid panel.    Prediabetes  Currently stable at goal with normal A1C  Continue diet and exercise.   Perform daily foot/skin check, notify office of any concerning changes.   Check A1C  Vitamin D Def  Continue medications:   Check Vit D level   Continue diet and meds as discussed. Further disposition pending results of labs. Discussed med's effects and SE's.   Over 30 minutes of exam, counseling, chart review, and critical decision making was performed.   Future Appointments  Date Time Provider Department Center  12/23/2017  9:45 AM [name], PA-C [facility] None  03/10/2018 10:30 AM Judd Gaudier, NP [facility] None  08/18/2018  9:00 AM [name], PA-C [facility] None    ----------------------------------------------------------------------------------------------------------------------  HPI 57 y.o. female  presents for 3 month follow up on hypothyroidism, cholesterol, prediabetes, vitamin D deficiency and depression.   She is currently no longer taking wellbutrin 150 mg daily and is tolerating well;  She takes trazodone 50 mg PRN in the evening to help with sleep, typically 1-2 x per week if melatonin is insufficient, and reports her symptoms are well controlled.   She does workout. She denies chest pain, shortness of breath, dizziness. BMI is There is no height or weight on file to calculate BMI., she is working on diet and exercise. Wt Readings from Last 3 Encounters:  08/12/17 191 lb (86.6 kg)  12/03/16 183 lb 3.2 oz (83.1 kg)  04/30/16 188 lb (85.3 kg)     She is  not on cholesterol medication. Her cholesterol is not at goal. The cholesterol last visit was:   Lab Results  Component Value Date   CHOL 214 (H) 08/12/2017   HDL 40 (L) 08/12/2017   LDLCALC 142 (H) 08/12/2017   TRIG 181 (H) 08/12/2017   CHOLHDL 5.4 (H) 08/12/2017    She has been working on diet and exercise for prediabetes, and denies hyperglycemia, hypoglycemia , nausea, paresthesia of the feet and polydipsia. Last A1C in the office was at goal:  Lab Results  Component Value Date   HGBA1C 5.9 (H) 08/12/2017   Patient is on Vitamin D supplement but below goal of 70-100:   Lab Results  Component Value Date   VD25OH 32 08/12/2017          Current Medications:  Current Outpatient Medications on File Prior to Visit  Medication Sig  . atorvastatin (LIPITOR) 10 MG tablet Take 1 tablet (10 mg total) by mouth daily.  Marland Kitchen buPROPion (WELLBUTRIN XL) 150 MG 24 hr tablet Take 1 tablet (150 mg total) by mouth every morning. (Patient not taking: Reported on 08/12/2017)  . Cholecalciferol (VITAMIN D PO) Take 5,000 Int'l Units by mouth daily.  . IRON PO Take by mouth daily.  Marland Kitchen levothyroxine (SYNTHROID, LEVOTHROID) 125 MCG tablet TAKE 1 TABLET BY MOUTH ONCE DAILY  . neomycin-polymyxin-hydrocortisone (CORTISPORIN) otic solution   . OVER THE COUNTER MEDICATION daily. Potassium- pt unsure of dose  . traZODone (DESYREL) 50 MG tablet Take 1 tablet (50 mg total) by mouth at bedtime. (Patient taking differently: Take 50 mg by mouth at bedtime as  needed. )   No current facility-administered medications on file prior to visit.      Allergies: No Known Allergies   Medical History:  Past Medical History:  Diagnosis Date  . Cholelithiasis   . Depression   . Fibrocystic breast disease   . GERD (gastroesophageal reflux disease)   . Hyperlipidemia   . Hypothyroidism   . Labile hypertension   . Migraine   . Prediabetes    Family history- Reviewed and unchanged Social history- Reviewed and  unchanged   Review of Systems:  Review of Systems  Constitutional: Negative.  Negative for chills, diaphoresis, fever, malaise/fatigue and weight loss.  HENT: Negative for congestion, ear discharge, ear pain, hearing loss, sinus pain and sore throat.   Eyes: Negative for blurred vision.  Respiratory: Negative for cough, hemoptysis, sputum production, shortness of breath and wheezing.   Cardiovascular: Negative for chest pain, palpitations and claudication.  Gastrointestinal: Negative for abdominal pain, blood in stool, constipation, diarrhea, heartburn, melena, nausea and vomiting.  Genitourinary: Positive for frequency. Negative for dysuria. Urgency: Nocturnal.  Musculoskeletal: Negative for myalgias.  Skin: Negative.  Negative for rash.  Neurological: Negative for dizziness, sensory change and headaches.  Endo/Heme/Allergies: Negative.  Negative for polydipsia.  Psychiatric/Behavioral: Negative for depression, substance abuse and suicidal ideas. The patient has insomnia (Reportedly triggered by husband CPAP). The patient is not nervous/anxious.       Physical Exam: There were no vitals taken for this visit. Wt Readings from Last 3 Encounters:  08/12/17 191 lb (86.6 kg)  12/03/16 183 lb 3.2 oz (83.1 kg)  04/30/16 188 lb (85.3 kg)   General Appearance: Well nourished, in no apparent distress. Eyes: PERRLA, EOMs, conjunctiva no swelling or erythema Sinuses: No Frontal/maxillary tenderness ENT/Mouth: Ext aud canals clear, TMs without erythema, bulging. No erythema, swelling, or exudate on post pharynx.  Tonsils not swollen or erythematous. Hearing normal.  Neck: Supple, thyroid normal.  Respiratory: Respiratory effort normal, BS equal bilaterally without rales, rhonchi, wheezing or stridor.  Cardio: RRR with no MRGs. Brisk peripheral pulses without edema.  Abdomen: Soft, + BS.  Non tender, no guarding, rebound, hernias, masses. Lymphatics: Non tender without lymphadenopathy.   Musculoskeletal: Full ROM, 5/5 strength, Normal gait Skin: Warm, dry without rashes, lesions, ecchymosis.  Neuro: Cranial nerves intact. No cerebellar symptoms.  Psych: Awake and oriented X 3, normal affect, Insight and Judgment appropriate.    [name], PA-C 3:01 PM [facility]

## 2017-12-23 ENCOUNTER — Ambulatory Visit: Payer: Self-pay | Admitting: Physician Assistant

## 2017-12-26 ENCOUNTER — Encounter: Payer: Self-pay | Admitting: *Deleted

## 2018-01-07 ENCOUNTER — Other Ambulatory Visit: Payer: Self-pay | Admitting: Internal Medicine

## 2018-03-03 NOTE — Progress Notes (Deleted)
FOLLOW UP  Assessment and Plan:   Cholesterol Currently above goal; newly on atorvastatin 10 mg since last visit *** Continue low cholesterol diet and exercise.  Check lipid panel.   Prediabetes Continue diet and exercise.  Perform daily foot/skin check, notify office of any concerning changes.  Check A1C  Overweight Long discussion about weight loss, diet, and exercise Recommended diet heavy in fruits and veggies and low in animal meats, cheeses, and dairy products, appropriate calorie intake Discussed ideal weight for height and initial weight goal (***) Patient will work on *** Will follow up in 3 months  Vitamin D Def Below goal at last visit; she *** changed her dose continue supplementation to maintain goal of 70-100 *** Vit D level  Hypothyroidism continue medications the same pending lab results reminded to take on an empty stomach 30-2860mins before food.  check TSH level  Depression, major, in remission (HCC) Continue medications *** Lifestyle discussed: diet/exerise, sleep hygiene, stress management, hydration  Insomnia, unspecified type Good sleep hygiene discussed, increase day time activity Doing well with trazodone   Continue diet and meds as discussed. Further disposition pending results of labs. Discussed med's effects and SE's.   Over 30 minutes of exam, counseling, chart review, and critical decision making was performed.   Future Appointments  Date Time Provider Department Center  03/10/2018 10:30 AM Judd Gaudierorbett, Rosio Weiss, NP [facility] None  08/18/2018  9:00 AM Quentin Mullingollier, Amanda, PA-C [facility] None    ----------------------------------------------------------------------------------------------------------------------  HPI 57 y.o. female  presents for 6 month follow up on hypertension, cholesterol, prediabetes, weight and vitamin D deficiency.   she has a diagnosis of depression and is currently on wellbutrin, trazodone at night for sleep, reports  symptoms are*** well controlled on current regimen. she    BMI is There is no height or weight on file to calculate BMI., she {HAS HAS WGN:56213}OT:18834} been working on diet and exercise. Wt Readings from Last 3 Encounters:  08/12/17 191 lb (86.6 kg)  12/03/16 183 lb 3.2 oz (83.1 kg)  04/30/16 188 lb (85.3 kg)   Her blood pressure {HAS HAS NOT:18834} been controlled at home, today their BP is    She {DOES_DOES YQM:57846}OT:18564} workout. She denies chest pain, shortness of breath, dizziness.   She is on cholesterol medication Atorvastatin 10 mg daily *** newly since last visit and denies myalgias. Her cholesterol is not at goal. The cholesterol last visit was:   Lab Results  Component Value Date   CHOL 214 (H) 08/12/2017   HDL 40 (L) 08/12/2017   LDLCALC 142 (H) 08/12/2017   TRIG 181 (H) 08/12/2017   CHOLHDL 5.4 (H) 08/12/2017    She {Has/has not:18111} been working on diet and exercise for prediabetes, and denies {Symptoms; diabetes w/o none:19199}. Last A1C in the office was:  Lab Results  Component Value Date   HGBA1C 5.9 (H) 08/12/2017   She is on thyroid medication. Her medication was not changed last visit.   Lab Results  Component Value Date   TSH 1.20 08/12/2017   Patient is on Vitamin D supplement.   Lab Results  Component Value Date   VD25OH 32 08/12/2017        Current Medications:  Current Outpatient Medications on File Prior to Visit  Medication Sig  . atorvastatin (LIPITOR) 10 MG tablet Take 1 tablet (10 mg total) by mouth daily.  Marland Kitchen. buPROPion (WELLBUTRIN XL) 150 MG 24 hr tablet Take 1 tablet (150 mg total) by mouth every morning. (Patient not taking: Reported on  08/12/2017)  . Cholecalciferol (VITAMIN D PO) Take 5,000 Int'l Units by mouth daily.  . IRON PO Take by mouth daily.  Marland Kitchen. levothyroxine (SYNTHROID, LEVOTHROID) 125 MCG tablet TAKE 1 TABLET BY MOUTH ONCE DAILY  . neomycin-polymyxin-hydrocortisone (CORTISPORIN) otic solution   . OVER THE COUNTER MEDICATION daily.  Potassium- pt unsure of dose  . traZODone (DESYREL) 50 MG tablet Take 1 tablet (50 mg total) by mouth at bedtime. (Patient taking differently: Take 50 mg by mouth at bedtime as needed. )   No current facility-administered medications on file prior to visit.      Allergies: No Known Allergies   Medical History:  Past Medical History:  Diagnosis Date  . Cholelithiasis   . Depression   . Fibrocystic breast disease   . GERD (gastroesophageal reflux disease)   . Hyperlipidemia   . Hypothyroidism   . Labile hypertension   . Migraine   . Prediabetes    Family history- Reviewed and unchanged Social history- Reviewed and unchanged   Review of Systems:  Review of Systems  Constitutional: Negative for malaise/fatigue and weight loss.  HENT: Negative for hearing loss and tinnitus.   Eyes: Negative for blurred vision and double vision.  Respiratory: Negative for cough, shortness of breath and wheezing.   Cardiovascular: Negative for chest pain, palpitations, orthopnea, claudication and leg swelling.  Gastrointestinal: Negative for abdominal pain, blood in stool, constipation, diarrhea, heartburn, melena, nausea and vomiting.  Genitourinary: Negative.   Musculoskeletal: Negative for joint pain and myalgias.  Skin: Negative for rash.  Neurological: Negative for dizziness, tingling, sensory change, weakness and headaches.  Endo/Heme/Allergies: Negative for polydipsia.  Psychiatric/Behavioral: Negative.   All other systems reviewed and are negative.     Physical Exam: There were no vitals taken for this visit. Wt Readings from Last 3 Encounters:  08/12/17 191 lb (86.6 kg)  12/03/16 183 lb 3.2 oz (83.1 kg)  04/30/16 188 lb (85.3 kg)   General Appearance: Well nourished, in no apparent distress. Eyes: PERRLA, EOMs, conjunctiva no swelling or erythema Sinuses: No Frontal/maxillary tenderness ENT/Mouth: Ext aud canals clear, TMs without erythema, bulging. No erythema, swelling, or  exudate on post pharynx.  Tonsils not swollen or erythematous. Hearing normal.  Neck: Supple, thyroid normal.  Respiratory: Respiratory effort normal, BS equal bilaterally without rales, rhonchi, wheezing or stridor.  Cardio: RRR with no MRGs. Brisk peripheral pulses without edema.  Abdomen: Soft, + BS.  Non tender, no guarding, rebound, hernias, masses. Lymphatics: Non tender without lymphadenopathy.  Musculoskeletal: Full ROM, 5/5 strength, {PSY - GAIT AND STATION:22860} gait Skin: Warm, dry without rashes, lesions, ecchymosis.  Neuro: Cranial nerves intact. No cerebellar symptoms.  Psych: Awake and oriented X 3, normal affect, Insight and Judgment appropriate.    Dan MakerAshley C Ameia Morency, NP 1:09 PM [facility]

## 2018-03-10 ENCOUNTER — Ambulatory Visit: Payer: Self-pay | Admitting: Adult Health

## 2018-04-04 NOTE — Progress Notes (Signed)
FOLLOW UP  Assessment and Plan:   Cholesterol Currently above goal; didn't tolerate atorvastatin 10 mg that was initiated at last visit - discussed and will trial low dose rosuvastatin 5 mg with very slow taper, start with once weekly and slowly increase to 3 days per week as tolerated Continue low cholesterol diet and exercise.  Check lipid panel.   Prediabetes Continue diet and exercise.  Perform daily foot/skin check, notify office of any concerning changes.  Check A1C  Obesity Long discussion about weight loss, diet, and exercise Recommended diet heavy in fruits and veggies and low in animal meats, cheeses, and dairy products, appropriate calorie intake Discussed ideal weight for height and initial weight goal (<180lb) Patient will work on eating more regularly during the day, avoid late night snacking, restart exercise (150 min/week) Will follow up in 3 months  Vitamin D Def Below goal at last visit; she did changed her dose continue supplementation to maintain goal of 60-100 Check Vit D level  Hypothyroidism continue medications the same pending lab results reminded to take on an empty stomach 30-63mins before food.  check TSH level  Depression, major, in remission (HCC) Self tapered off of medications and reports she feels well Lifestyle discussed: diet/exerise, sleep hygiene, stress management, hydration  Insomnia, unspecified type Good sleep hygiene discussed, increase day time activity Doing well with lifestyle and melatonin  Continue diet and meds as discussed. Further disposition pending results of labs. Discussed med's effects and SE's.   Over 30 minutes of exam, counseling, chart review, and critical decision making was performed.   Future Appointments  Date Time Provider Department Center  08/18/2018  9:00 AM [name], PA-C [facility] None     ----------------------------------------------------------------------------------------------------------------------  HPI 58 y.o. female  presents for 6 month follow up on hypertension, cholesterol, prediabetes, weight and vitamin D deficiency.   she has a diagnosis of depression, her sister died, dog passed away and was on Wellbutrin and trazodone, and reports she was feeling better and tapered off of meds and is doing fairly and doesn't want to restart anything. She sleeps fairly at night with warm milk and melatonin.   BMI is Body mass index is 31.64 kg/m., she has not been working on diet and exercise but is willing to work on this, she cut soda out 1 month ago, is drinking water mainly, occasional soda/beer. She admits late night snacking is a problem.  Wt Readings from Last 3 Encounters:  04/07/18 196 lb (88.9 kg)  08/12/17 191 lb (86.6 kg)  12/03/16 183 lb 3.2 oz (83.1 kg)   Her blood pressure has been controlled at home, today their BP is BP: 130/90  She does not workout. She denies chest pain, shortness of breath, dizziness.   She is not on cholesterol medication, started on atorvastatin 10 mg at last visit but stopped due to perceived fatigue since starting. Her cholesterol is not at goal. The cholesterol last visit was:   Lab Results  Component Value Date   CHOL 214 (H) 08/12/2017   HDL 40 (L) 08/12/2017   LDLCALC 142 (H) 08/12/2017   TRIG 181 (H) 08/12/2017   CHOLHDL 5.4 (H) 08/12/2017    She has not been working on diet and exercise for prediabetes, and denies increased appetite, nausea, paresthesia of the feet, polydipsia, polyuria and visual disturbances. Last A1C in the office was:  Lab Results  Component Value Date   HGBA1C 5.9 (H) 08/12/2017   She is on thyroid medication. Her medication was not  changed last visit.   Lab Results  Component Value Date   TSH 1.20 08/12/2017   Patient is on Vitamin D supplement.   Lab Results  Component Value Date    VD25OH 32 08/12/2017       Current Medications:  Current Outpatient Medications on File Prior to Visit  Medication Sig  . Calcium Carbonate (CALCIUM 600 PO) Take by mouth.  . Cholecalciferol (VITAMIN D PO) Take 5,000 Int'l Units by mouth daily.  . Cyanocobalamin (VITAMIN B12 SL) Place under the tongue.  . IRON PO Take by mouth daily.  Marland Kitchen levothyroxine (SYNTHROID, LEVOTHROID) 125 MCG tablet TAKE 1 TABLET BY MOUTH ONCE DAILY  . OVER THE COUNTER MEDICATION daily. Potassium- pt unsure of dose  . atorvastatin (LIPITOR) 10 MG tablet Take 1 tablet (10 mg total) by mouth daily. (Patient not taking: Reported on 04/07/2018)  . buPROPion (WELLBUTRIN XL) 150 MG 24 hr tablet Take 1 tablet (150 mg total) by mouth every morning. (Patient not taking: Reported on 08/12/2017)  . neomycin-polymyxin-hydrocortisone (CORTISPORIN) otic solution   . traZODone (DESYREL) 50 MG tablet Take 1 tablet (50 mg total) by mouth at bedtime. (Patient taking differently: Take 50 mg by mouth at bedtime as needed. )   No current facility-administered medications on file prior to visit.      Allergies: No Known Allergies   Medical History:  Past Medical History:  Diagnosis Date  . Cholelithiasis   . Depression   . Fibrocystic breast disease   . GERD (gastroesophageal reflux disease)   . Hyperlipidemia   . Hypothyroidism   . Labile hypertension   . Migraine   . Prediabetes    Family history- Reviewed and unchanged Social history- Reviewed and unchanged   Review of Systems:  Review of Systems  Constitutional: Negative for malaise/fatigue and weight loss.  HENT: Negative for hearing loss and tinnitus.   Eyes: Negative for blurred vision and double vision.  Respiratory: Negative for cough, shortness of breath and wheezing.   Cardiovascular: Negative for chest pain, palpitations, orthopnea, claudication and leg swelling.  Gastrointestinal: Negative for abdominal pain, blood in stool, constipation, diarrhea,  heartburn, melena, nausea and vomiting.  Genitourinary: Negative.   Musculoskeletal: Negative for joint pain and myalgias.  Skin: Negative for rash.  Neurological: Negative for dizziness, tingling, sensory change, weakness and headaches.  Endo/Heme/Allergies: Negative for polydipsia.  Psychiatric/Behavioral: Negative.   All other systems reviewed and are negative.     Physical Exam: BP 130/90   Pulse 65   Temp (!) 97.5 F (36.4 C)   Ht 5\' 6"  (1.676 m)   Wt 196 lb (88.9 kg)   SpO2 98%   BMI 31.64 kg/m  Wt Readings from Last 3 Encounters:  04/07/18 196 lb (88.9 kg)  08/12/17 191 lb (86.6 kg)  12/03/16 183 lb 3.2 oz (83.1 kg)   General Appearance: Well nourished, in no apparent distress. Eyes: PERRLA, EOMs, conjunctiva no swelling or erythema Sinuses: No Frontal/maxillary tenderness ENT/Mouth: Ext aud canals clear, TMs without erythema, bulging. No erythema, swelling, or exudate on post pharynx.  Tonsils not swollen or erythematous. Hearing normal.  Neck: Supple, thyroid normal.  Respiratory: Respiratory effort normal, BS equal bilaterally without rales, rhonchi, wheezing or stridor.  Cardio: RRR with no MRGs. Brisk peripheral pulses without edema.  Abdomen: Soft, + BS.  Non tender, no guarding, rebound, hernias, masses. Lymphatics: Non tender without lymphadenopathy.  Musculoskeletal: Full ROM, 5/5 strength, Normal gait Skin: Warm, dry without rashes, lesions, ecchymosis.  Neuro: Cranial nerves  intact. No cerebellar symptoms.  Psych: Awake and oriented X 3, normal affect, Insight and Judgment appropriate.    Dan MakerAshley C Sharronda Schweers, NP 8:46 AM [facility]

## 2018-04-07 ENCOUNTER — Encounter: Payer: Self-pay | Admitting: Adult Health

## 2018-04-07 ENCOUNTER — Ambulatory Visit: Payer: BLUE CROSS/BLUE SHIELD | Admitting: Adult Health

## 2018-04-07 VITALS — BP 130/90 | HR 65 | Temp 97.5°F | Ht 66.0 in | Wt 196.0 lb

## 2018-04-07 DIAGNOSIS — E039 Hypothyroidism, unspecified: Secondary | ICD-10-CM

## 2018-04-07 DIAGNOSIS — F325 Major depressive disorder, single episode, in full remission: Secondary | ICD-10-CM | POA: Diagnosis not present

## 2018-04-07 DIAGNOSIS — E559 Vitamin D deficiency, unspecified: Secondary | ICD-10-CM

## 2018-04-07 DIAGNOSIS — G4700 Insomnia, unspecified: Secondary | ICD-10-CM | POA: Diagnosis not present

## 2018-04-07 DIAGNOSIS — K760 Fatty (change of) liver, not elsewhere classified: Secondary | ICD-10-CM

## 2018-04-07 DIAGNOSIS — E782 Mixed hyperlipidemia: Secondary | ICD-10-CM | POA: Diagnosis not present

## 2018-04-07 DIAGNOSIS — Z79899 Other long term (current) drug therapy: Secondary | ICD-10-CM

## 2018-04-07 DIAGNOSIS — R7303 Prediabetes: Secondary | ICD-10-CM

## 2018-04-07 DIAGNOSIS — E669 Obesity, unspecified: Secondary | ICD-10-CM

## 2018-04-07 MED ORDER — ROSUVASTATIN CALCIUM 5 MG PO TABS: ORAL_TABLET | ORAL | 1 refills | Status: DC

## 2018-04-07 NOTE — Patient Instructions (Addendum)
Goals    . Exercise 150 min/wk Moderate Activity    . HEMOGLOBIN A1C < 5.7    . LDL CALC < 100    . Weight (lb) < 180 lb (81.6 kg)        We will start low dose (5mg ) rosuvastatin - start by taking once a week (on Monday) in the evening with dinner. In a few weeks, increase to twice a week (Monday and Friday), and if tolerating in a few weeks increase to three days a week (Monday, Wednesday, Friday).      Know what a healthy weight is for you (roughly BMI <25) and aim to maintain this  Aim for 7+ servings of fruits and vegetables daily  65-80+ fluid ounces of water or unsweet tea for healthy kidneys  Limit to max 1 drink of alcohol per day; avoid smoking/tobacco  Limit animal fats in diet for cholesterol and heart health - choose grass fed whenever available  Avoid highly processed foods, and foods high in saturated/trans fats  Aim for low stress - take time to unwind and care for your mental health  Aim for 150 min of moderate intensity exercise weekly for heart health, and weights twice weekly for bone health  Aim for 7-9 hours of sleep daily   Rosuvastatin Tablets Qu es este medicamento? La ROSUVASTATINA es conocido como un inhibidor de la HMG-CoA reductasa o 'estatias'. Reduce el nivel de colesterol y triglicridos en la sangre. Este medicamento tambin puede reducir el riesgo de Marine scientistpadecer ataques cardiacos, derrame cerebral u otros problemas de la salud en pacientes que corren el riesgo de padecer una enfermedad cardiaca. Marnee GuarneriUna dieta y cambios a su estilo de vida son a menudo utilizados con Industrial/product designereste medicamento. Este medicamento puede ser utilizado para otros usos; si tiene alguna pregunta consulte con su proveedor de atencin mdica o con su farmacutico. MARCAS COMUNES: Crestor Qu le debo informar a mi profesional de la salud antes de tomar este medicamento? Necesitan saber si usted presenta alguno de los Coventry Health Caresiguientes problemas o situaciones: diabetes si bebe alcohol con  frecuencia antecedentes de accidente cerebrovascular enfermedad renal enfermedad heptica debilidad o dolores musculares enfermedad tiroidea una reaccin alrgica o inusual a la rosuvastatina, a otros medicamentos, alimentos, colorantes o conservantes si est embarazada o buscando quedar embarazada si est amamantando a un beb Cmo debo utilizar este medicamento? Tome este medicamento por va oral con un vaso de agua. Siga las instrucciones de la etiqueta del [place]ic[place]. No corte, triture ni CenterPoint Energymastique este medicamento. Puede tomar este medicamento con o sin alimentos. Tome sus dosis a intervalos regulares. No tome su medicamento con una frecuencia mayor a la indicada. Hable con su pediatra para informarse acerca del uso de este medicamento en nios. Aunque este medicamento se puede recetar a nios tan pequeos como de 7 aos de edad con ciertas afecciones, existen precauciones que deben tomarse. Sobredosis: Pngase en contacto inmediatamente con un centro toxicolgico o una sala de urgencia si usted cree que haya tomado demasiado medicamento. ATENCIN: Reynolds AmericanEste medicamento es solo para usted. No comparta este medicamento con nadie. Qu sucede si me olvido de una dosis? Si olvida una dosis, tmela lo antes posible. Si debe tomar su prxima dosis en menos de 12 horas, entonces no tome la dosis que olvid. Tome la prxima dosis a la hora habitual. No tome dosis adicionales o dobles. Qu puede interactuar con este medicamento? No tome esta medicina con ninguno de los siguientes medicamentos: -medicamentos a base de hierbas, tales como levadura  roja de arroz Esta medicina tambin puede interactuar con los siguientes medicamentos: -alcohol -anticidos que contienen hidrxido de aluminio o hidrxido de magnesio -ciclosporina -otros medicamentos para el colesterol alto -algunos medicamentos para la infeccin por VIH -warfarina Puede ser que esta lista no menciona todas las posibles interacciones. Informe a  su profesional de [employer] de [employer] productos a base de hierbas, medicamentos de [place] o suplementos nutritivos que est tomando. Si usted fuma, consume bebidas alcohlicas o si utiliza drogas ilegales, indqueselo tambin a su profesional de [employer]. Algunas sustancias pueden interactuar con su medicamento. A qu debo estar atento al usar [employer]? Visite a su mdico o a su profesional de la salud para revisar su evolucin peridicamente. Es posible que necesite realizarse pruebas peridicamente para asegurarse de que el hgado est funcionando en forma correcta. Su profesional de la salud puede indicarle que deje de tomar este medicamento si desarrolla problemas musculares. Si sus problemas musculares no desaparecen despus de dejar de tomar [employer], contacte a su profesional de [employer]. No debe quedar embarazada mientras est tomando [employer]. Las mujeres deben informar a su profesional de la salud si estn buscando quedar embarazadas o si creen que podran estar embarazadas. Existe la posibilidad de efectos secundarios graves en un beb sin nacer. Para obtener ms informacin, hable con su profesional de la salud o su farmacutico. No debe amamantar a un beb mientras est tomando este medicamento. Este medicamento podra [employer] niveles de Banker. Si tiene diabetes, consulte a su mdico o a su profesional de la salud antes de cambiar su dieta o la dosis de su medicamento para la diabetes. Si va a someterse a una operacin o a otro procedimiento, informe a su mdico que est [employer]. Este frmaco es solo parte de un programa completo para Comptroller. Su mdico o un dietista puede sugerir una dieta baja en colesterol y en grasas para ayudar. Evite beber alcohol y Art therapist, y siga un programa de ejercicio fsico adecuado. Este medicamento puede causar una disminucin de la Co-enzima Q-10. Debe asegurarse de recibir suficiente Co-enzima  Q-10 mientras toma este medicamento. Converse con su profesional de la salud sobre los alimentos que come y las vitaminas que toma. Qu efectos secundarios puedo tener al [employer] este medicamento? Efectos secundarios que debe informar a su mdico o a Producer, television/film/video de la salud tan pronto como sea posible: -Therapist, art como erupcin cutnea, picazn o urticarias, hinchazn de la cara, labios o lengua -orina de color oscuro -fiebre -dolores articulares -calambres, dolores musculares -enrojecimiento, formacin de ampollas, descamacin o distensin de la piel, inclusive dentro de la boca -dificultad para orinar o cambios en el volumen de orina -cansancio o debilidad inusual -color amarillento de ojos o piel Efectos secundarios que, por lo general, no requieren atencin mdica (debe informarlos a su mdico o a su profesional de la salud si persisten o si son molestos): -estreimiento -acidez de estmago -nuseas -gases, dolor, Dentist de [employer] ser que esta lista no menciona todos los posibles efectos secundarios. Comunquese a su mdico por asesoramiento mdico [employer]. Usted puede informar los efectos secundarios a la FDA por telfono al 1-800-FDA-1088. Dnde debo guardar mi medicina? Mantngala fuera del alcance de los nios. Gurdela a [employer], entre 20 y 25 grados C (63 y 80 grados F). Mantenga el envase bien cerrado (protjalo de la humedad). Deseche todo el medicamento que no haya utilizado, despus de la fecha  de vencimiento. ATENCIN: Este folleto es un resumen. Puede ser que no cubra toda la posible informacin. Si usted tiene preguntas acerca de esta medicina, consulte con su mdico, su farmacutico o su profesional de Radiographer, therapeutic.  2019 Elsevier/Gold Standard (2016-12-20 00:00:00)      Enfermedad del hgado graso Fatty Liver Disease  La enfermedad del hgado graso ocurre cuando se acumula demasiada grasa en las clulas del  hgado. La enfermedad del hgado graso tambin se llama esteatosis heptica o esteatohepatitis. El hgado elimina las sustancias dainas del torrente sanguneo y produce lquidos que el cuerpo necesita. Tambin ayuda al cuerpo a Chemical engineer y Academic librarian la energa obtenida de los alimentos que come. En muchos casos, la enfermedad del hgado graso no provoca sntomas ni problemas. Con frecuencia, se diagnostica cuando se realizan estudios por otros motivos. Sin embargo, con el [place], el hgado graso puede provocar una inflamacin que posiblemente cause problemas hepticos ms graves, como la fibrosis heptica (cirrosis) o insuficiencia heptica. El hgado graso se asocia con la resistencia a la insulina, el aumento de la grasa corporal, la presin arterial alta (hipertensin) y el colesterol elevado. Estas son caractersticas del sndrome metablico y [place] el riesgo de accidente cerebrovascular, diabetes y enfermedad cardaca. Cules son las causas? Esta afeccin puede ser causada por lo siguiente:  Beber alcohol en exceso.  Mala alimentacin.  Obesidad.  Sndrome de [place].  Diabetes.  Colesterol alto.  Determinados medicamentos.  Txicos.  Algunas infecciones virales.  Embarazo. Qu incrementa el riesgo? Es ms probable que tenga esta afeccin si:  Consume alcohol en exceso.  Tiene sobrepeso.  Tiene diabetes.  Tiene hepatitis.  Tiene un nivel alto de triglicridos.  Est embarazada. Cules son los signos o los sntomas? Con frecuencia, la enfermedad del hgado graso no provoca sntomas. Si se desarrollan sntomas, estos pueden incluir:  Fatiga.  Debilidad.  Prdida de peso.  Confusin.  Dolor abdominal.  Nuseas y vmitos.  Color amarillo en la piel y en la zona blanca de los ojos (ictericia).  Picazn en la piel. Cmo se diagnostica? Esta afeccin puede diagnosticarse mediante:  Un examen fsico y antecedentes mdicos.  Anlisis de  [place].  Estudios de diagnstico por imgenes, como ecografa, exploracin por tomografa computarizada (TC) o Health visitor (RM).  Biopsia de hgado. Se extrae una pequea muestra de tejido del hgado usando [place]. La muestra se examina en el microscopio. Cmo se trata? Con frecuencia, la enfermedad del hgado graso es causada por otras afecciones. El tratamiento para el hgado graso puede incluir medicamentos y cambios en el estilo de vida para controlar enfermedades como:  Alcoholismo.  Colesterol alto.  Diabetes.  Tener exceso de [place] u obesidad. Siga estas indicaciones en su casa:   No beba alcohol. Si tiene problemas para dejar de beber, consulte al mdico cmo puede dejar de beber de forma segura con la ayuda de medicamentos o un programa con supervisin. Esto es importante para evitar que la afeccin empeore.  Siga una dieta saludable segn lo indicado por su mdico. Consulte al mdico sobre trabajar con un especialista en alimentacin y nutricin (nutricionista) a fin de Public librarian plan de alimentacin.  Haga ejercicio regularmente. Esto puede ayudarlo a [employer], y a Public house manager y la diabetes. Hable con el mdico sobre qu actividades son mejores para usted y IT trainer de ejercicios.  Tome los medicamentos de venta libre y los recetados solamente como se lo haya indicado el mdico.  Concurra a todas las visitas de  control como se lo haya indicado el mdico. Esto es importante. Comunquese con un mdico si: Tiene dificultad para controlar lo siguiente:  Nivel de Bankerazcar en la sangre. Esto es muy importante si tiene diabetes.  El colesterol.  El consumo de alcohol. Solicite ayuda de inmediato si:  Siente dolor abdominal.  Tiene ictericia.  Tiene nuseas y vmitos.  Vomita sangre de color rojo brillante o una sustancia similar a los granos de caf.  Las heces son negras, alquitranadas o sanguinolentas. Resumen  La  enfermedad del hgado graso se desarrolla cuando se acumula demasiada grasa en las clulas del hgado.  Con frecuencia, la enfermedad del hgado no causa sntomas ni problemas. Sin embargo, con Museum/gallery conservatorel tiempo, el hgado graso puede provocar una inflamacin que puede causar problemas hepticos ms graves, como fibrosis heptica (cirrosis).  Es ms probable que desarrolle esta afeccin si consume alcohol en exceso, est embarazada, tiene sobrepeso, diabetes, hepatitis o altos niveles de triglicridos.  Comunquese con el mdico si tiene problemas para Mohawk Industriescontrolar el peso, los niveles de azcar en la sangre, el colesterol o el consumo de alcohol. Esta informacin no tiene Theme park managercomo fin reemplazar el consejo del mdico. Asegrese de hacerle al mdico cualquier pregunta que tenga. Document Released: 12/17/2012 Document Revised: 02/25/2017 Document Reviewed: 02/25/2017 Elsevier Interactive Patient Education  2019 Arvin[employer]itorElsevier Inc.

## 2018-04-08 LAB — COMPLETE METABOLIC PANEL WITH GFR
AG Ratio: 1.8 (calc) (ref 1.0–2.5)
ALT: 54 U/L — AB (ref 6–29)
AST: 32 U/L (ref 10–35)
Albumin: 4.8 g/dL (ref 3.6–5.1)
Alkaline phosphatase (APISO): 77 U/L (ref 33–130)
BUN: 18 mg/dL (ref 7–25)
CALCIUM: 10 mg/dL (ref 8.6–10.4)
CO2: 27 mmol/L (ref 20–32)
CREATININE: 0.7 mg/dL (ref 0.50–1.05)
Chloride: 104 mmol/L (ref 98–110)
GFR, EST NON AFRICAN AMERICAN: 97 mL/min/{1.73_m2} (ref >=60)
GFR, Est African American: 112 mL/min/{1.73_m2} (ref >=60)
Globulin: 2.7 g/dL (calc) (ref 1.9–3.7)
Glucose, Bld: 99 mg/dL (ref 65–99)
Potassium: 4.2 mmol/L (ref 3.5–5.3)
Sodium: 138 mmol/L (ref 135–146)
Total Bilirubin: 0.6 mg/dL (ref 0.2–1.2)
Total Protein: 7.5 g/dL (ref 6.1–8.1)

## 2018-04-08 LAB — CBC WITH DIFFERENTIAL/PLATELET
Absolute Monocytes: 312 cells/uL (ref 200–950)
BASOS PCT: 0.5 %
Basophils Absolute: 33 cells/uL (ref 0–200)
EOS PCT: 0.9 %
Eosinophils Absolute: 59 cells/uL (ref 15–500)
HCT: 41 % (ref 35.0–45.0)
Hemoglobin: 13.8 g/dL (ref 11.7–15.5)
LYMPHS ABS: 2542 {cells}/uL (ref 850–3900)
MCH: 30.1 pg (ref 27.0–33.0)
MCHC: 33.7 g/dL (ref 32.0–36.0)
MCV: 89.3 fL (ref 80.0–100.0)
MPV: 11.7 fL (ref 7.5–12.5)
Monocytes Relative: 4.8 %
NEUTROS PCT: 54.7 %
Neutro Abs: 3556 cells/uL (ref 1500–7800)
PLATELETS: 214 10*3/uL (ref 140–400)
RBC: 4.59 10*6/uL (ref 3.80–5.10)
RDW: 12 % (ref 11.0–15.0)
TOTAL LYMPHOCYTE: 39.1 %
WBC: 6.5 10*3/uL (ref 3.8–10.8)

## 2018-04-08 LAB — HEMOGLOBIN A1C
Hgb A1c MFr Bld: 6 % of total Hgb — ABNORMAL HIGH (ref <5.7)
Mean Plasma Glucose: 126 (calc)
eAG (mmol/L): 7 (calc)

## 2018-04-08 LAB — LIPID PANEL
CHOL/HDL RATIO: 4.3 (calc) (ref <5.0)
Cholesterol: 212 mg/dL — ABNORMAL HIGH (ref <200)
HDL: 49 mg/dL — AB (ref >50)
LDL CHOLESTEROL (CALC): 136 mg/dL — AB
NON-HDL CHOLESTEROL (CALC): 163 mg/dL — AB (ref <130)
Triglycerides: 141 mg/dL (ref <150)

## 2018-04-08 LAB — VITAMIN D 25 HYDROXY (VIT D DEFICIENCY, FRACTURES): Vit D, 25-Hydroxy: 27 ng/mL — ABNORMAL LOW (ref 30–100)

## 2018-04-08 LAB — TSH: TSH: 3.87 mIU/L (ref 0.40–4.50)

## 2018-04-12 ENCOUNTER — Other Ambulatory Visit: Payer: Self-pay | Admitting: Adult Health

## 2018-05-19 ENCOUNTER — Encounter: Payer: Self-pay | Admitting: Physician Assistant

## 2018-07-14 ENCOUNTER — Ambulatory Visit: Payer: Self-pay | Admitting: Adult Health

## 2018-08-11 ENCOUNTER — Other Ambulatory Visit: Payer: Self-pay | Admitting: Physician Assistant

## 2018-08-14 NOTE — Progress Notes (Deleted)
Complete Physical  Assessment and Plan:  Diagnoses and all orders for this visit:  Encounter for routine adult health examination without abnormal findings  Gastroesophageal reflux disease, esophagitis presence not specified Well managed by lifestyle/PRN OTC agents Discussed diet, avoiding triggers and other lifestyle changes  Hypothyroidism, unspecified type continue medications the same pending lab results reminded to take on an empty stomach 30-10mins before food.  check TSH level  Vitamin D deficiency Continue supplementation Check vitamin D level  Prediabetes Discussed disease and risks Discussed diet/exercise, weight management  A1C  Overweight (BMI 25.0-29.9) Long discussion about weight loss, diet, and exercise Recommended diet heavy in fruits and veggies and low in animal meats, cheeses, and dairy products, appropriate calorie intake Discussed appropriate weight for height  Follow up at next visit  Mixed hyperlipidemia Currently with mild elevations; attempting lifestyle modification; initiate statin as indicated Continue low cholesterol diet and exercise.  Check lipid panel.   Depression, major, in remission (HCC) Continue medications  Lifestyle discussed: diet/exerise, sleep hygiene, stress management, hydration  Insomnia, unspecified type Good sleep hygiene discussed, increase day time activity Doing well with trazodone   Screening breast cancer + family history, will schedule for her due to language barrier  Cervical cancer screening Overdue PAP; no hx; will do with HPV check today  No orders of the defined types were placed in this encounter.   Discussed med's effects and SE's. Screening labs and tests as requested with regular follow-up as recommended. Over 40 minutes of exam, counseling, chart review, and complex, high level critical decision making was performed this visit.   Future Appointments  Date Time Provider Department Center   08/18/2018  9:00 AM [name], PA-C [facility] None  08/25/2019  9:00 AM [name], PA-C [facility] None     HPI  58 y.o. female. 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 She reports her mother was recently diagnosed with breast cancer; she reports she has had a mammogram several years ago; she is also overdue for PAP.   BMI is There is no height or weight on file to calculate BMI., she has been working on diet and exercise. Wt Readings from Last 3 Encounters:  04/07/18 196 lb (88.9 kg)  08/12/17 191 lb (86.6 kg)  12/03/16 183 lb 3.2 oz (83.1 kg)   Today their BP is   She does workout. She denies chest pain, shortness of breath, dizziness.   She is not on cholesterol medication and denies myalgias. Her cholesterol is not at goal. The cholesterol last visit was:   Lab Results  Component Value Date   CHOL 212 (H) 04/07/2018   HDL 49 (L) 04/07/2018   LDLCALC 136 (H) 04/07/2018   TRIG 141 04/07/2018   CHOLHDL 4.3 04/07/2018   She has been working on diet and exercise for prediabetes, she is not on bASA, she is not on ACE/ARB (BP controlled) and denies foot ulcerations, increased appetite, nausea, paresthesia of the feet, polydipsia, polyuria, visual disturbances, vomiting and weight loss. Last A1C in the office was:  Lab Results  Component Value Date   HGBA1C 6.0 (H) 04/07/2018   She is on thyroid medication. Her medication was changed last visit.   Lab Results  Component Value Date   TSH 3.87 04/07/2018   Last  GFR: Lab Results  Component Value Date   GFRNONAA 97 04/07/2018   Patient is on Vitamin D supplement.   Lab Results  Component Value Date   VD25OH 27 (L)  04/07/2018      Current Medications:  Current Outpatient Medications on File Prior to Visit  Medication Sig Dispense Refill  . atorvastatin (LIPITOR) 10 MG tablet Take 1 tablet (10 mg total) by mouth daily. (Patient not taking: Reported on 04/07/2018) 90 tablet 1  . Calcium Carbonate (CALCIUM 600 PO) Take by mouth.    . Cholecalciferol (VITAMIN D PO) Take 5,000 Int'l Units by mouth daily.    . Cyanocobalamin (VITAMIN B12 SL) Place under the tongue.    . IRON PO Take by mouth daily.    Marland Kitchen levothyroxine (SYNTHROID) 125 MCG tablet Take 1 tablet by mouth once daily 90 tablet 0  . OVER THE COUNTER MEDICATION daily. Potassium- pt unsure of dose    . rosuvastatin (CRESTOR) 5 MG tablet Take 1 tab 1-3 days a week as directed in the evening for cholesterol. 60 tablet 1   No current facility-administered medications on file prior to visit.    Allergies:  Allergies  Allergen Reactions  . Atorvastatin     Fatigue    Medical History:  She has Abnormal glucose; Hyperlipidemia; Vitamin D deficiency; Hypothyroidism; Depression, major, in remission (HCC); GERD (gastroesophageal reflux disease); Medication management; Obesity (BMI 30.0-34.9); Insomnia; and Fatty liver on their problem list. Health Maintenance:   Immunization History  Administered Date(s) Administered  . Influenza Split 02/08/2014  . Influenza, Seasonal, Injecte, Preservative Fre 02/21/2015  . Td 03/12/2002  . Tdap 02/08/2014   Tetanus: 2015  Flu vaccine: 2016, currently n/a Shingrix: declines  LMP: No LMP recorded. (Menstrual status: Perimenopausal). Pap: 01/2014, DUE MGM: ?, Will order to schedule today due to language barrier  Colonoscopy: 2015? can't find report - patient reports she has documentation, will present/mail EGD: n/a  Last Dental Exam: 2019, goes q6 month Last Eye Exam: Remote, will schedule  Patient Care Team: Lucky Cowboy, MD as PCP - General (Internal Medicine) Dorisann Frames, MD as Consulting  Physician (Endocrinology)  Surgical History:  She has a past surgical history that includes thyroid ablation (2008). Family History:  Herfamily history includes Aneurysm in her brother and sister; Arthritis in her sister; Breast cancer in her mother; Heart attack (age of onset: 47) in her paternal grandfather; Hyperlipidemia in her father and mother; Hypertension in her mother. Social History:  She reports that she has never smoked. She has never used smokeless tobacco. She reports current alcohol use. She reports that she does not use drugs.  Review of Systems: Review of Systems  Constitutional: Negative for malaise/fatigue and weight loss.  HENT: Negative for hearing loss and tinnitus.   Eyes: Negative for blurred vision and double vision.  Respiratory: Negative for cough, sputum production, shortness of breath and wheezing.   Cardiovascular: Negative for chest pain, palpitations, orthopnea, claudication, leg swelling and PND.  Gastrointestinal: Negative for abdominal pain, blood in stool, constipation, diarrhea, heartburn, melena, nausea and vomiting.  Genitourinary: Negative.   Musculoskeletal: Negative for falls, joint pain and myalgias.  Skin: Negative for rash.  Neurological: Negative for dizziness, tingling, sensory change, weakness and headaches.  Endo/Heme/Allergies: Negative for polydipsia.  Psychiatric/Behavioral: Negative.  Negative for depression, memory loss, substance abuse and suicidal ideas. The patient is not nervous/anxious and does not have insomnia.   All other systems reviewed and are negative.   Physical Exam: Estimated body mass  index is 31.64 kg/m as calculated from the following:   Height as of 04/07/18: 5\' 6"  (1.676 m).   Weight as of 04/07/18: 196 lb (88.9 kg). There were no vitals taken for this visit. General Appearance: Well nourished, in no apparent distress.  Eyes: PERRLA, EOMs, conjunctiva no swelling or erythema, normal fundi and vessels.  Sinuses:  No Frontal/maxillary tenderness  ENT/Mouth: Ext aud canals clear, normal light reflex with TMs without erythema, bulging. Good dentition. No erythema, swelling, or exudate on post pharynx. Tonsils not swollen or erythematous. Hearing normal.  Neck: Supple, thyroid normal. No bruits  Respiratory: Respiratory effort normal, BS equal bilaterally without rales, rhonchi, wheezing or stridor.  Cardio: RRR without murmurs, rubs or gallops. Brisk peripheral pulses without edema.  Chest: symmetric, with normal excursions and percussion.  Breasts: Symmetric, without lumps, nipple discharge, retractions.  Abdomen: Soft, nontender, no guarding, rebound, hernias, masses, or organomegaly.  Lymphatics: Non tender without lymphadenopathy.  Genitourinary: Normal internal and external structures Musculoskeletal: Full ROM all peripheral extremities,5/5 strength, and normal gait.  Skin: Warm, dry without rashes, lesions, ecchymosis. Neuro: Cranial nerves intact, reflexes equal bilaterally. Normal muscle tone, no cerebellar symptoms. Sensation intact.  Psych: Awake and oriented X 3, normal affect, Insight and Judgment appropriate.   EKG: WNL no ST changes.  Quentin MullingAmanda Hartford Maulden 3:00 PM [facility]

## 2018-08-18 ENCOUNTER — Encounter: Payer: Self-pay | Admitting: Physician Assistant

## 2018-11-10 ENCOUNTER — Other Ambulatory Visit: Payer: Self-pay | Admitting: Physician Assistant

## 2018-12-10 NOTE — Progress Notes (Signed)
SELF PAY  Assessment and Plan:  Gastroesophageal reflux disease, esophagitis presence not specified Well managed by lifestyle/PRN OTC agents Discussed diet, avoiding triggers and other lifestyle changes  Hypothyroidism, unspecified type continue medications the same pending lab results reminded to take on an empty stomach 30-73mins before food.  check TSH level  Vitamin D deficiency Continue supplementation Increase vitamin D, check next OV with insurance  Abnormal glucose Discussed disease and risks Discussed diet/exercise, weight management  Defer due to lack of insurance STOP SODA  Overweight (BMI 25.0-29.9) Long discussion about weight loss, diet, and exercise Recommended diet heavy in fruits and veggies and low in animal meats, cheeses, and dairy products, appropriate calorie intake Discussed appropriate weight for height  Follow up at next visit  Mixed hyperlipidemia Currently with mild elevations; attempting lifestyle modification; initiate statin as indicated Continue low cholesterol diet and exercise.  Check lipid panel next OV due to lack of insurance  Depression, major, in remission ([place]) Lifestyle discussed: diet/exerise, sleep hygiene, stress management, hydration  Insomnia, unspecified type Good sleep hygiene discussed, increase day time activity  Orders Placed This Encounter  Procedures  . COMPLETE METABOLIC PANEL WITH GFR  . TSH    Discussed med's effects and SE's. Screening labs and tests as requested with regular follow-up as recommended.  SCHEDULE CPE IN 3-6 MONTHS WITH INSURANCE.   Future Appointments  Date Time Provider [place]  08/25/2019  9:00 AM Vicie Mutters, PA-C [facility] None     HPI  58 y.o. female. 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   BMI is Body mass index is 30.99 kg/m., she has been working on diet and exercise. Wt Readings from Last 3 Encounters:  12/15/18 192 lb (87.1 kg)  04/07/18 196 lb (88.9 kg)  08/12/17 191 lb (86.6 kg)   Today their BP is BP: 128/86 She does workout. She denies chest pain, shortness of breath, dizziness.   She is not on cholesterol medication and denies myalgias. Her cholesterol is not at goal. The cholesterol last visit was:   Lab Results  Component Value Date   CHOL 212 (H) 04/07/2018   HDL 49 (L) 04/07/2018   LDLCALC 136 (H) 04/07/2018   TRIG 141 04/07/2018   CHOLHDL 4.3 04/07/2018   She has been working on diet and exercise for prediabetes, she is not on bASA, she is not on ACE/ARB (BP controlled) and denies foot ulcerations, increased appetite, nausea, paresthesia of the feet, polydipsia, polyuria, visual disturbances, vomiting and weight loss. Last A1C in the office was:  Lab Results  Component Value Date   HGBA1C 6.0 (H) 04/07/2018   She is on thyroid medication. Her medication was changed last visit.   Lab Results  Component Value Date   TSH 3.87 04/07/2018   Last GFR: Lab Results  Component Value Date   GFRNONAA 97 04/07/2018   Patient is on Vitamin D supplement, she is on 5000 IU every day.    Lab Results  Component Value Date   VD25OH 27 (L) 04/07/2018      Current Medications:  Current Outpatient Medications on File Prior to Visit  Medication Sig Dispense Refill  . Calcium Carbonate (CALCIUM 600 PO) Take  by mouth.    . Cholecalciferol (VITAMIN D PO) Take 5,000 Int'l Units by mouth daily.    . Cyanocobalamin (VITAMIN B12 SL) Place under the tongue.    . IRON PO Take by mouth daily.    Marland Kitchen OVER THE COUNTER MEDICATION daily. Potassium- pt unsure of dose     No  current facility-administered medications on file prior to visit.    Allergies:  Allergies  Allergen Reactions  . Atorvastatin     Fatigue    Medical History:  She has Abnormal glucose; Hyperlipidemia; Vitamin D deficiency; Hypothyroidism; Depression, major, in remission ([facility]); GERD (gastroesophageal reflux disease); Medication management; Obesity (BMI 30.0-34.9); Insomnia; and Fatty liver on their problem list. Health Maintenance:   Immunization History  Administered Date(s) Administered  . Influenza Split 02/08/2014  . Influenza, Seasonal, Injecte, Preservative Fre 02/21/2015  . Td 03/12/2002  . Tdap 02/08/2014   Tetanus: 2015  Flu vaccine: 2016 Shingrix: declines  LMP: No LMP recorded. (Menstrual status: Perimenopausal). Pap: 11/2017 MGM: 08/2017-- [facility]  Colonoscopy: 2019 can't find report - patient reports she has documentation, will present/mail EGD: n/a  Last Dental Exam: 2019, goes q6 month Last Eye Exam: Remote, will schedule  Patient Care Team: Lucky Cowboy, MD as PCP - General (Internal Medicine) Dorisann Frames, MD as Consulting Physician (Endocrinology)  Surgical History:  She has a past surgical history that includes thyroid ablation (2008). Family History:  Herfamily history includes Aneurysm in her brother and sister; Arthritis in her sister; Breast cancer in her mother; Heart attack (age of onset: 77) in her paternal grandfather; Hyperlipidemia in her father and mother; Hypertension in her mother. Mother with pacemaker.  Social History:  She reports that she has never smoked. She has never used smokeless tobacco. She reports current alcohol use. She reports that she does not use drugs.  Review of Systems: Review of Systems  Constitutional: Negative for malaise/fatigue and weight loss.  HENT: Negative for hearing loss and tinnitus.   Eyes: Negative for blurred vision and double vision.  Respiratory: Negative for cough, sputum  production, shortness of breath and wheezing.   Cardiovascular: Negative for chest pain, palpitations, orthopnea, claudication, leg swelling and PND.  Gastrointestinal: Negative for abdominal pain, blood in stool, constipation, diarrhea, heartburn, melena, nausea and vomiting.  Genitourinary: Negative.   Musculoskeletal: Negative for falls, joint pain and myalgias.  Skin: Negative for rash.  Neurological: Negative for dizziness, tingling, sensory change, weakness and headaches.  Endo/Heme/Allergies: Negative for polydipsia.  Psychiatric/Behavioral: Negative.  Negative for depression, memory loss, substance abuse and suicidal ideas. The patient is not nervous/anxious and does not have insomnia.   All other systems reviewed and are negative.   Physical Exam: Estimated body mass index is 30.99 kg/m as calculated from the following:   Height as of 04/07/18: 5\' 6"  (1.676 m).   Weight as of this encounter: 192 lb (87.1 kg). BP 128/86   Pulse 83   Temp 97.7 F (36.5 C)   Wt 192 lb (87.1 kg)   SpO2 99%   BMI 30.99 kg/m  General Appearance: Well nourished, in no apparent distress.  Eyes: PERRLA, EOMs, conjunctiva no swelling or erythema, normal fundi and vessels.  Sinuses: No Frontal/maxillary tenderness  ENT/Mouth: Ext aud canals clear, normal light reflex with TMs without erythema, bulging. Good dentition. No erythema, swelling, or exudate on post pharynx. Tonsils not swollen or erythematous. Hearing normal.  Neck: Supple, thyroid normal. No bruits  Respiratory: Respiratory effort normal, BS  equal bilaterally without rales, rhonchi, wheezing or stridor.  Cardio: RRR without murmurs, rubs or gallops. Brisk peripheral pulses without edema.  Chest: symmetric, with normal excursions and percussion.  Breasts: Symmetric, without lumps, nipple discharge, retractions.  Abdomen: Soft, nontender, no guarding, rebound, hernias, masses, or organomegaly.  Lymphatics: Non tender without lymphadenopathy.   Genitourinary: Normal internal and external structures Musculoskeletal: Full ROM all peripheral extremities,5/5 strength, and normal gait.  Skin: Warm, dry without rashes, lesions, ecchymosis. Neuro: Cranial nerves intact, reflexes equal bilaterally. Normal muscle tone, no cerebellar symptoms. Sensation intact.  Psych: Awake and oriented X 3, normal affect, Insight and Judgment appropriate.    Quentin MullingAmanda Angeli Demilio 10:13 AM [facility]

## 2018-12-15 ENCOUNTER — Encounter: Payer: Self-pay | Admitting: Physician Assistant

## 2018-12-15 ENCOUNTER — Other Ambulatory Visit: Payer: Self-pay

## 2018-12-15 ENCOUNTER — Ambulatory Visit (INDEPENDENT_AMBULATORY_CARE_PROVIDER_SITE_OTHER): Payer: Self-pay | Admitting: Physician Assistant

## 2018-12-15 VITALS — BP 128/86 | HR 83 | Temp 97.7°F | Wt 192.0 lb

## 2018-12-15 DIAGNOSIS — K760 Fatty (change of) liver, not elsewhere classified: Secondary | ICD-10-CM

## 2018-12-15 DIAGNOSIS — Z79899 Other long term (current) drug therapy: Secondary | ICD-10-CM

## 2018-12-15 DIAGNOSIS — K219 Gastro-esophageal reflux disease without esophagitis: Secondary | ICD-10-CM

## 2018-12-15 DIAGNOSIS — E039 Hypothyroidism, unspecified: Secondary | ICD-10-CM

## 2018-12-15 DIAGNOSIS — F325 Major depressive disorder, single episode, in full remission: Secondary | ICD-10-CM

## 2018-12-15 DIAGNOSIS — Z0001 Encounter for general adult medical examination with abnormal findings: Secondary | ICD-10-CM

## 2018-12-15 DIAGNOSIS — G4700 Insomnia, unspecified: Secondary | ICD-10-CM

## 2018-12-15 DIAGNOSIS — R7309 Other abnormal glucose: Secondary | ICD-10-CM

## 2018-12-15 DIAGNOSIS — E782 Mixed hyperlipidemia: Secondary | ICD-10-CM

## 2018-12-15 DIAGNOSIS — E669 Obesity, unspecified: Secondary | ICD-10-CM

## 2018-12-15 DIAGNOSIS — E559 Vitamin D deficiency, unspecified: Secondary | ICD-10-CM

## 2018-12-15 MED ORDER — LEVOTHYROXINE SODIUM 125 MCG PO TABS: 125.0000 ug | ORAL_TABLET | Freq: Every day | ORAL | 0 refills | Status: DC

## 2018-12-15 NOTE — Patient Instructions (Addendum)
Your LDL is the bad cholesterol that can lead to heart attack and stroke. To lower your number you can decrease your fatty foods, red meat, cheese, milk and increase fiber like whole grains and veggies. You can also add a fiber supplement like Citracel or Benefiber, these do not cause gas and bloating and are safe to use.    Stop the soda!!   VITAMIN D IS IMPORTANT  Vitamin D goal is between 60-80  Please make sure that you are taking your Vitamin D as directed.   It is very important as a natural anti-inflammatory   helping hair, skin, and nails, as well as reducing stroke and heart attack risk.   It helps your bones and helps with mood.  We want you on at least 10,000 daily or can get 50,000 IU pill from [facility] and take 1-2 a week.   It also decreases numerous cancer risks so please take it as directed.   Low Vit D is associated with a 200-300% higher risk for CANCER   and 200-300% higher risk for HEART   ATTACK  &  STROKE.    .....................................Marland Kitchen  It is also associated with higher death rate at younger ages,   autoimmune diseases like Rheumatoid arthritis, Lupus, Multiple Sclerosis.     Also many other serious conditions, like depression, Alzheimer's  Dementia, infertility, muscle aches, fatigue, fibromyalgia - just to name a few.  +++++++++++++++++++  Can get liquid vitamin D from [place] here in [place] at  [facility] alternatives [address] [place], [place] [address] Or you can try earth fare      Bad carbs also include fruit juice, alcohol, and sweet tea. These are empty calories that do not signal to your brain that you are full.   Please remember the good carbs are still carbs which convert into sugar. So please measure them out no more than 1/2-1 cup of rice, oatmeal, pasta, and beans  Veggies are however free foods! Pile them on.   Not all fruit is created equal. Please see the list below, the fruit at the bottom is higher in sugars  than the fruit at the top. Please avoid all dried fruits.

## 2018-12-16 LAB — COMPLETE METABOLIC PANEL WITH GFR
AG Ratio: 2 (calc) (ref 1.0–2.5)
ALT: 38 U/L — ABNORMAL HIGH (ref 6–29)
AST: 26 U/L (ref 10–35)
Albumin: 4.3 g/dL (ref 3.6–5.1)
Alkaline phosphatase (APISO): 77 U/L (ref 37–153)
BUN: 20 mg/dL (ref 7–25)
CO2: 24 mmol/L (ref 20–32)
Calcium: 9.1 mg/dL (ref 8.6–10.4)
Chloride: 109 mmol/L (ref 98–110)
Creat: 0.82 mg/dL (ref 0.50–1.05)
GFR, Est African American: 93 mL/min/{1.73_m2} (ref >=60)
GFR, Est Non African American: 80 mL/min/{1.73_m2} (ref >=60)
Globulin: 2.2 g/dL (calc) (ref 1.9–3.7)
Glucose, Bld: 94 mg/dL (ref 65–99)
Potassium: 3.8 mmol/L (ref 3.5–5.3)
Sodium: 143 mmol/L (ref 135–146)
Total Bilirubin: 0.3 mg/dL (ref 0.2–1.2)
Total Protein: 6.5 g/dL (ref 6.1–8.1)

## 2018-12-16 LAB — TSH: TSH: 4.51 mIU/L — ABNORMAL HIGH (ref 0.40–4.50)

## 2018-12-31 LAB — HM MAMMOGRAPHY

## 2019-01-22 ENCOUNTER — Encounter: Payer: Self-pay | Admitting: *Deleted

## 2019-05-09 ENCOUNTER — Other Ambulatory Visit: Payer: Self-pay | Admitting: Physician Assistant

## 2019-05-09 DIAGNOSIS — E039 Hypothyroidism, unspecified: Secondary | ICD-10-CM

## 2019-05-11 ENCOUNTER — Other Ambulatory Visit: Payer: Self-pay

## 2019-05-11 ENCOUNTER — Other Ambulatory Visit: Payer: Self-pay | Admitting: Physician Assistant

## 2019-05-11 DIAGNOSIS — E039 Hypothyroidism, unspecified: Secondary | ICD-10-CM

## 2019-05-11 NOTE — Telephone Encounter (Signed)
Patient has request a refill on EUTHYROX/LEVOTHYROXINE which was refilled by the provider and for 45 days which were to last til Apr 5th office visit.

## 2019-05-25 NOTE — Progress Notes (Signed)
Subjective:    Patient ID: Robin Montes, female    DOB: 1960/06/13, 59 y.o.   MRN: [id number]  HPI 59 y.o. obese female with history of depression, chol, hypothyroidism, insomnia presents with ringing in her ears.   States she has had right ear ringing/buzzing x Jan, occ in left ear, hears water bubbles, feels like fluid in her ear, some things seem louder. States it is worse with lying down.  Not pulsatile tinnitus, no decreased hearing, no imbalance.  No double vision, abnormal vision.  Denies sinus pressure/pain, some rhinorrhea with lying down.   Will choke herself awake occ.  She does have GERD.   Blood pressure 124/70, pulse 76, temperature 98.1 F (36.7 C), weight 197 lb (89.4 kg), SpO2 98 %.   BMI is Body mass index is 31.8 kg/m., she is working on diet and exercise. Wt Readings from Last 3 Encounters:  05/26/19 197 lb (89.4 kg)  12/15/18 192 lb (87.1 kg)  04/07/18 196 lb (88.9 kg)     Medications  Current Outpatient Medications (Endocrine & Metabolic):  .  levothyroxine (SYNTHROID) 125 MCG tablet, Take 1 tablet in AM 30 minute prior to any other medication and with water. .  predniSONE (DELTASONE) 20 MG tablet, 2 tablets daily for 3 days, 1 tablet daily for 4 days.   Current Outpatient Medications (Respiratory):  .  triamcinolone (NASACORT) 55 MCG/ACT AERO nasal inhaler, Place 2 sprays into the nose at bedtime.   Current Outpatient Medications (Hematological):  Marland Kitchen  Cyanocobalamin (VITAMIN B12 SL), Place under the tongue. .  IRON PO, Take by mouth daily.  Current Outpatient Medications (Other):  Marland Kitchen  Calcium Carbonate (CALCIUM 600 PO), Take by mouth. .  Cholecalciferol (VITAMIN D PO), Take 5,000 Int'l Units by mouth daily. Marland Kitchen  OVER THE COUNTER MEDICATION, daily. Potassium- pt unsure of dose .  omeprazole (PRILOSEC) 40 MG capsule, Take 1 capsule (40 mg total) by mouth daily.  Problem list She has Abnormal glucose; Hyperlipidemia; Vitamin D deficiency;  Hypothyroidism; Depression, major, in remission ([place]); GERD (gastroesophageal reflux disease); Medication management; Obesity (BMI 30.0-34.9); Insomnia; and Fatty liver on their problem list.  Review of Systems  Constitutional: Negative.  Negative for chills and fever.  HENT: Positive for congestion, postnasal drip, rhinorrhea and tinnitus. Negative for dental problem, drooling, ear discharge, ear pain, facial swelling, hearing loss, mouth sores, nosebleeds, sinus pressure, sinus pain, sneezing, sore throat, trouble swallowing and voice change.   Eyes: Negative.  Negative for visual disturbance.  Respiratory: Positive for choking (at night). Negative for apnea, cough, chest tightness, shortness of breath, wheezing and stridor.   Cardiovascular: Negative.   Gastrointestinal: Negative for abdominal distention, abdominal pain, anal bleeding, blood in stool, constipation, diarrhea, nausea, rectal pain and vomiting.       + GERD  Genitourinary: Negative.   Musculoskeletal: Negative.   Hematological: Negative.   Psychiatric/Behavioral: Negative.        Objective:   Physical Exam Constitutional:      General: She is not in acute distress.    Appearance: Normal appearance. She is obese. She is not ill-appearing.  HENT:     Right Ear: Tympanic membrane, ear canal and external ear normal. There is no impacted cerumen.     Left Ear: Tympanic membrane, ear canal and external ear normal. There is no impacted cerumen.     Nose: Nose normal.     Mouth/Throat:     Mouth: Mucous membranes are moist.     Pharynx: Oropharynx  is clear.  Eyes:     Extraocular Movements: Extraocular movements intact.     Pupils: Pupils are equal, round, and reactive to light.  Cardiovascular:     Rate and Rhythm: Normal rate and regular rhythm.  Pulmonary:     Effort: Pulmonary effort is normal.     Breath sounds: Normal breath sounds.  Abdominal:     General: Abdomen is flat.     Palpations: Abdomen is soft.      Tenderness: There is no abdominal tenderness. There is no guarding or rebound.  Musculoskeletal:        General: Normal range of motion.     Cervical back: Normal range of motion and neck supple.  Skin:    General: Skin is warm.  Neurological:     General: No focal deficit present.     Mental Status: She is alert and oriented to person, place, and time.     Cranial Nerves: No cranial nerve deficit.        Assessment & Plan:   Tinnitus of right ear -     predniSONE (DELTASONE) 20 MG tablet; 2 tablets daily for 3 days, 1 tablet daily for 4 days. -     triamcinolone (NASACORT) 55 MCG/ACT AERO nasal inhaler; Place 2 sprays into the nose at bedtime. No hearing loss, no imbalance, normal neuro If not better will refer to ENT  Gastroesophageal reflux disease with esophagitis without hemorrhage -     omeprazole (PRILOSEC) 40 MG capsule; Take 1 capsule (40 mg total) by mouth daily. Possible contributing to symptoms  Discussed possible OSA- will think about it- print out given  The patient was advised to call immediately if she has any concerning symptoms in the interval. The patient voices understanding of current treatment options and is in agreement with the current care plan.The patient knows to call the clinic with any problems, questions or concerns or go to the ER if any further progression of symptoms.  Close follow up 1 month   Future Appointments  Date Time Provider Department Center  07/21/2019  3:00 PM [name], PA-C [facility] None

## 2019-05-26 ENCOUNTER — Other Ambulatory Visit: Payer: Self-pay

## 2019-05-26 ENCOUNTER — Encounter: Payer: Self-pay | Admitting: Physician Assistant

## 2019-05-26 ENCOUNTER — Ambulatory Visit: Payer: 59 | Admitting: Physician Assistant

## 2019-05-26 VITALS — BP 124/70 | HR 76 | Temp 98.1°F | Wt 197.0 lb

## 2019-05-26 DIAGNOSIS — H9311 Tinnitus, right ear: Secondary | ICD-10-CM

## 2019-05-26 DIAGNOSIS — K2100 Gastro-esophageal reflux disease with esophagitis, without bleeding: Secondary | ICD-10-CM | POA: Diagnosis not present

## 2019-05-26 MED ORDER — OMEPRAZOLE 40 MG PO CPDR: 40.0000 mg | DELAYED_RELEASE_CAPSULE | Freq: Every day | ORAL | 3 refills | Status: DC

## 2019-05-26 MED ORDER — PREDNISONE 20 MG PO TABS: ORAL_TABLET | ORAL | 0 refills | Status: DC

## 2019-05-26 MED ORDER — TRIAMCINOLONE ACETONIDE 55 MCG/ACT NA AERO: 2.0000 | INHALATION_SPRAY | Freq: Every day | NASAL | 3 refills | Status: DC

## 2019-05-26 NOTE — Patient Instructions (Addendum)
Your ears and sinuses are connected by the eustachian tube. When your sinuses are inflamed, this can close off the tube and cause fluid to collect in your middle ear. This can then cause dizziness, popping, clicking, ringing, and echoing in your ears. This is often NOT an infection and does NOT require antibiotics, it is caused by inflammation so the treatments help the inflammation. This can take a long time to get better so please be patient.  Here are things you can do to help with this:  Will do oral prednisone AND please due to the nasal spray at night.   IF THE RINGING IS NOT BETTER PLEASE LET us KNOW AFTER 1 MONTH AND WE WILL SEND YOU TO AN ENT  TRY CERVICAL NECK PILLOW CAN REFER TO SLEEP STUDY TO RULE OUT SLEEP APNEA  - Try the Flonase or Nasonex. Remember to spray each nostril twice towards the outer part of your eye.  Do not sniff but instead pinch your nose and tilt your head back to help the medicine get into your sinuses.  The best time to do this is at bedtime.Stop if you get blurred vision or nose bleeds.  -While drinking fluids, pinch and hold nose close and swallow, to help open eustachian tubes to drain fluid behind ear drums. -Please pick one of the over the counter allergy medications below and take it once daily for allergies.  It will also help with fluid behind ear drums. Claritin or loratadine cheapest but likely the weakest  Zyrtec or certizine at night because it can make you sleepy The strongest is allegra or fexafinadine  Cheapest at [facility], [facility], [facility] -can use decongestant over the counter, please do not use if you have high blood pressure or certain heart conditions.   if worsening HA, changes vision/speech, imbalance, weakness go to the ER   Disfuncin de la trompa de Eustaquio Eustachian Tube Dysfunction  Disfuncin de la trompa de Eustaquio hace referencia a una afeccin en la que se forma una obstruccin en el pasaje estrecho que conecta el odo medio  con la parte posterior de la nariz (trompa de [place]). La trompa de [employer] regula la presin del aire en el odo medio al permitir que el aire se mueva entre el odo y la Darene Lamer. Tambin ayuda a Forensic psychologist lquido del espacio del odo [place]. La disfuncin de la trompa de Eustaquio puede afectar a uno o ambos odos. Cuando la trompa de Eustaquio no funciona correctamente, la presin de aire, de lquido, o ambas pueden acumularse en el odo medio. Cules son las causas? Esta afeccin se produce cuando la trompa de Eustaquio se obstruye o no puede abrirse normalmente. Las causas ms frecuentes de esta afeccin incluyen las siguientes:  Infecciones en los odos.  Resfriados y otras infecciones que afectan la nariz, la boca y la garganta (vas respiratorias superiores).  Alergias.  Irritacin por el humo del cigarrillo.  Irritacin por el retroceso de cido estomacal hacia el esfago (reflujo gastroesofgico). El esfago es un conducto que transporta los alimentos y bebidas desde la boca al [place].  Cambios sbitos en la presin del aire, como cuando baja el avin o cuando se practica buceo.  Crecimientos anormales en la nariz o la garganta, por ejemplo: ? Crecimientos en el interior de la nariz (plipos nasales). ? Crecimiento anormal de clulas (tumores). ? Agrandamiento de tejido en la parte posterior de la garganta (adenoides). Qu incrementa el riesgo? Es ms probable que contraiga esta afeccin si:  Fuma.  Tiene sobrepeso.  Es un nio que tiene: ? Ciertos defectos congnitos de la boca, como fisura Field seismologist. ? Amgdalas o adenoides grandes. Cules son los signos o sntomas? Los sntomas frecuentes de esta afeccin incluyen los siguientes:  Sensacin de que el odo est lleno.  Dolor de odo.  Ruidos de chasquidos o estallidos en el odo.  Zumbidos en el odo.  Prdida auditiva.  Prdida del equilibrio.  Mareos. Los sntomas pueden empeorar cuando la presin  del aire que lo rodea cambia, como cuando viaja a una zona de gran elevacin, vuela en avin o practica buceo. Cmo se diagnostica? Esta afeccin se puede diagnosticar en funcin de lo siguiente:  Sus sntomas.  Un examen fsico de los odos, la nariz y Patent examiner.  Estudios, como por ejemplo aquellos que miden: ? El movimiento del tmpano (timpanograma). ? Su audicin Lorel Monaco). Cmo se trata? El tratamiento depende de la causa y de la gravedad de Engineer, manufacturing systems.  En los casos leves, es posible [employer] sntomas con el movimiento de aire [place] odos. Esto se denomina "destaparse los odos".  En los casos ms graves, o si tiene sntomas de lquido [employer] odos, el tratamiento puede incluir: ? Medicamentos para Human resources officer congestin (descongestivos). ? Medicamentos para tratar alergias (antihistamnicos). ? Aerosoles nasales o gotas ticas que contengan medicamentos que reduzcan la inflamacin (corticoesteroides). ? Un procedimiento para drenar el lquido del tmpano (miringotoma). En este procedimiento, se coloca un tubo pequeo en el tmpano para:  Drenar el lquido.  Restablecer el aire en el espacio del odo medio. ? Un procedimiento para insertar un dispositivo de baln a travs de la nariz para inflar la abertura de la trompa de Eustaquio (dilatacin con baln). Siga estas indicaciones en su casa: Estilo de vida  No haga ninguna de estas cosas hasta que el mdico lo autorice: ? Viajar a grandes alturas. ? Viajar en avin. ? Trabajar en una cabina o una habitacin presurizada. ? Practicar buceo.  No consuma ningn producto que contenga nicotina o tabaco, como cigarrillos y Psychologist, sport and exercise. Si necesita ayuda para dejar de consumir, consulte al mdico.  Mantenga los odos secos. Use tapones para los odos que le calcen bien al ducharse y baarse. Despus squese bien los odos. Indicaciones generales  [employer] de venta libre y los recetados  solamente como se lo haya indicado el mdico.  Use las tcnicas que le haya recomendado el mdico para ayudar a destaparse los odos. Estas medidas pueden incluir: ? Psychologist, clinical. ? Bostezos. ? Tragar vigorosamente con frecuencia. ? Cerrar la boca, taparse la nariz y soplar suavemente como si estuviera tratando de soplar aire por la Doran Durand.  Concurrir a todas las visitas de seguimiento como se lo haya indicado el mdico. Esto es importante. Comunquese con un mdico si:  Sus sntomas no desaparecen despus del tratamiento.  Los sntomas regresan despus del tratamiento.  No logra destaparse los odos.  Tiene los siguientes sntomas: ? Cristy Hilts. ? Dolor en el odo. ? Dolor en la cabeza o el cuello. ? Lquido que le supura del odo.  La audicin le cambia de repente.  Se siente muy mareado.  Pierde el equilibrio. Resumen  Disfuncin de la trompa de Eustaquio hace referencia a una afeccin en la que se forma una obstruccin en la trompa de [place].  Puede ser consecuencia de infecciones del odo, alergias, inhalacin de sustancias irritantes o crecimientos anormales en la nariz o la garganta.  Los sntomas incluyen dolor de odo,  prdida de la audicin o zumbidos en los odos.  Los casos leves se tratan con maniobras para destapar los odos, como bostezar o [employer] odos.  Los [employer]orne graves se tratan con medicamentos. Tambin puede realizarse una ciruga (poco frecuente). Esta informacin no tiene Theme park manager el consejo del mdico. Asegrese de hacerle al mdico cualquier pregunta que tenga. Document Revised: 07/24/2017 Document Reviewed: 07/24/2017 Elsevier Patient Education  2020 Elsevier Inc.  Apnea del sueo Sleep Apnea La apnea del sueo es una afeccin en la que la respiracin se detiene o se hace superficial mientras duerme. Los episodios de apnea del sueo suelen durar 10 segundos o ms, y pueden ocurrir hasta 20 veces por hora. La apnea del sueo  interrumpe el sueo y evita que el cuerpo descanse como lo necesita. Esta afeccin puede aumentar el riesgo de sufrir ciertos problemas de [place], como:  Infarto de miocardio.  Accidente cerebrovascular.  Obesidad.  Diabetes.  Insuficiencia cardaca.  Latidos cardacos irregulares. Cules son las causas? Existen tres tipos de apnea del sueo:  Apnea obstructiva del sueo. Este tipo de apnea ocurre cuando las vas respiratorias se obstruyen o colapsan.  Apnea central del sueo. Este tipo ocurre cuando la parte del cerebro que controla la respiracin no enva las seales correctas a los msculos que controlan la respiracin.  Apnea mixta del sueo. Esta es una combinacin de apnea obstructiva y central del sueo. La causa ms frecuente de esta afeccin es la obstruccin o el colapso de las vas respiratorias. Las vas respiratorias pueden colapsar o bloquearse en los siguientes casos:  Los msculos de la garganta estn anormalmente relajados.  La lengua y las amgdalas son ms grandes que lo normal.  Tiene sobrepeso.  Las vas respiratorias son ms pequeas que lo normal. Qu incrementa el riesgo? Es ms probable que tenga esta afeccin si:  Tiene sobrepeso.  Fuma.  Tiene vas respiratorias ms pequeas que lo normal.  Es de [address]  Es hombre.  Bebe alcohol.  Toma sedantes o tranquilizantes.  Tiene antecedentes familiares de apnea del sueo. Cules son los signos o los sntomas? Los sntomas de esta afeccin incluyen:  Dificultad para Personal assistant dormido.  Somnolencia y Chemical engineer.  Irritabilidad.  Ronquidos fuertes.  Dolores de cabeza matutinos.  Dificultad para concentrarse.  Olvidos.  Disminucin del inters por el sexo.  Somnolencia sin motivo aparente.  Cambios en el estado de nimo.  Cambios en la personalidad.  Sentimientos de depresin.  Levantarse con frecuencia durante la noche para ir a Geographical information systems officer.  Sequedad en  la boca.  Dolor de Advertising copywriter. Cmo se diagnostica? Esta afeccin se puede diagnosticar con lo siguiente:  Los antecedentes mdicos.  Un examen fsico.  Neomia Dear serie de pruebas que se realizan [employer] persona duerme (estudio del sueo). Estas pruebas generalmente se hacen en un laboratorio del sueo, pero tambin pueden Architectural technologist. Cmo se trata? El tratamiento de esta afeccin tiene como objetivo restablecer la respiracin normal y [employer] sntomas durante el sueo. Puede implicar controlar los problemas de salud que pueden afectar la respiracin, como la presin arterial alta o la obesidad. El tratamiento puede incluir:  Dormir de [place].  Si tiene congestin nasal, Freight forwarder.  Evitar el consumo de depresores, como el alcohol, sedantes y narcticos.  Si tiene sobrepeso, [employer].  Realizar cambios en la dieta.  Dejar de fumar.  Usar un dispositivo para abrir las vas respiratorias mientras duerme; por ejemplo: ? Un aparato bucal.  Se trata de una boquilla hecha a medida que desplaza la mandbula hacia adelante. ? Un dispositivo de presin positiva de las vas areas continua (continuous positive airway pressure, CPAP). Este dispositivo sopla aire a travs de una mscara cuando usted exhala. ? Un dispositivo de presin positiva de las vas areas espiratoria nasal (expiratory positive airway pressure, EPAP). Este dispositivo tiene vlvulas que se colocan en cada fosa nasal. ? Un dispositivo de presin positiva de las vas areas [employer] (bi-level positive airway pressure, BPAP). Este dispositivo sopla aire a travs de una mscara cuando usted inhala y exhala.  Someterse a Biomedical engineer tratamientos no Comptroller. Durante la ciruga, el exceso de tejido se elimina para Public relations account executive las vas respiratorias. Realizar un tratamiento para la apnea del sueo es importante. Sin el tratamiento [place], esta afeccin puede causar lo  siguiente:  Presin arterial alta.  Arteriopata coronaria.  En los hombres, incapacidad para alcanzar o Designer, fashion/clothing (impotencia).  Reduccin de las habilidades de pensamiento. Siga estas instrucciones en su casa: Estilo de vida  Haga cambios en su estilo de vida segn las recomendaciones de su mdico.  Consuma una dieta sana y [place].  Si tiene sobrepeso, tome medidas para bajar de [place].  Evite el uso de depresores, como el alcohol, sedantes y narcticos.  No consuma ningn producto que contenga nicotina o tabaco, como cigarrillos, cigarrillos electrnicos y tabaco de Theatre manager. Si necesita ayuda para dejar de fumar, consulte al [employer]. Instrucciones generales  [employer] de venta libre y los recetados solamente como se lo haya indicado el mdico.  Si le proporcionaron un dispositivo para abrir las vas respiratorias mientras duerme, selo solamente como se lo haya indicado el mdico.  Si va a someterse a una ciruga, no olvide informarle al mdico que tiene apnea del sueo. Puede ser necesario que lleve su dispositivo consigo.  Concurra a todas las visitas de [address] se lo haya indicado el mdico. Esto es importante. Comunquese con un mdico si:  El dispositivo que le proporcionaron para abrir las vas respiratorias mientras duerme es incmodo o no parece funcionar.  Sus sntomas no mejoran.  Sus sntomas empeoran. Solicite ayuda inmediatamente si:  Tiene los siguientes signos y sntomas: ? Journalist, newspaper. ? Falta de aire. ? [employer], los brazos o el [place].  Tiene lo siguiente: ? Dificultad para hablar. ? Debilidad en un lado del cuerpo. ? Se le cae el rostro. Estos sntomas pueden representar un problema grave que constituye Radio broadcast assistant. No espere a ver si los sntomas desaparecen. Solicite atencin mdica de inmediato. Comunquese con el servicio de emergencias de su localidad (911 en los [place]).  No conduzca por sus propios medios [employer] hospital. Resumen  La apnea del sueo es una afeccin en la que la respiracin se detiene o se hace superficial mientras duerme.  La causa ms frecuente es la obstruccin o el colapso de las vas respiratorias.  El objetivo del tratamiento es restablecer la respiracin normal y Paramedic los sntomas durante el sueo. Esta informacin no tiene Theme park manager el consejo del mdico. Asegrese de hacerle al mdico cualquier pregunta que tenga. Document Revised: 11/20/2017 Document Reviewed: 11/20/2017 Elsevier Patient Education  2020 Elsevier Inc.  Enfermedad de reflujo gastroesofgico en los adultos Gastroesophageal Reflux Disease, Adult El reflujo gastroesofgico (RGE) ocurre cuando el cido del estmago sube por el tubo que conecta la boca con el estmago (esfago). Normalmente, la comida baja por el  esfago y se [employer], donde se la digiere. Sin embargo, cuando una persona tiene [place], los alimentos y el cido estomacal suelen volver al esfago. Si esto se vuelve un problema ms grave, a la persona se le puede diagnosticar una enfermedad llamada enfermedad de reflujo gastroesofgico (ERGE). La ERGE ocurre cuando el reflujo:  Sucede a menudo.  Causa sntomas frecuentes o graves.  Causa problemas tales como dao en el esfago. Cuando el cido del Proofreader en contacto con el esfago, el cido puede provocar dolor (inflamacin) en el esfago. Con el tiempo, pueden formarse pequeos agujeros (lceras) en el revestimiento del esfago. Cules son las causas? Esta afeccin se debe a un problema en el msculo que se encuentra entre el esfago y Investment banker, corporate (esfnter esofgico inferior, o EEI). Normalmente, el EEI se cierra una vez que la comida pasa a travs del esfago hasta el [place]. Cuando el EEI se encuentra debilitado o tiene alguna anomala, no se cierra por completo, y eso permite que tanto la comida como el jugo gstrico,  que es cido, [place] a subir por el esfago. El EEI puede debilitarse a causa de ciertas sustancias alimenticias, medicamentos y Pharmacist, community, que incluyen:  El consumo de [place].  [place].  Tener una hernia de hiato.  Consumo de alcohol.  Ciertos alimentos y bebidas, como caf, chocolate, cebollas y [place]. Qu incrementa el riesgo? Es ms probable que tenga esta afeccin si:  Tiene un aumento del Runner, broadcasting/film/video.  Tiene un trastorno del tejido conjuntivo.  [place] antiinflamatorios no esteroideos (AINE). Cules son los signos o los sntomas? Los sntomas de esta afeccin incluyen:  Acidez estomacal.  Dificultad o dolor al tragar.  Sensacin de Warehouse manager un bulto en la garganta.  Sabor amargo en la boca.  Mal aliento.  Gran cantidad de saliva.  Estmago inflamado o con Dentist.  Eructos.  Dolor en el pecho. El dolor de pecho puede deberse a distintas afecciones. Es importante que consulte al mdico si tiene dolor de [place].  Dificultad para respirar o sibilancias.  Tos constante (crnica) o tos nocturna.  Desgaste del Engineer, structural.  Prdida de peso. Cmo se diagnostica? El mdico le har una historia clnica y un examen fsico. Para determinar si tiene ERGE leve o grave, el mdico tambin puede controlar cmo usted reacciona al tratamiento. Tambin pueden [employer], que [place] los siguientes:  Un estudio para examinarle el [place] y el esfago con una cmara pequea (endoscopa).  Una prueba para medir el grado de Technical sales engineer.  Una prueba para medir cunta presin hay en el esfago.  Un estudio de deglucin con bario comn o modificado para ver la forma, el tamao y el funcionamiento del esfago. Cmo se trata? El [place] del tratamiento es ayudar a Paramedic los sntomas y Automotive engineer las complicaciones. El tratamiento de esta afeccin puede variar segn la gravedad de los sntomas. El mdico puede recomendarle lo siguiente:  Cambios en la  dieta.  Medicamentos.  [place]. Siga estas indicaciones en su casa: Comida y bebida   Siga la dieta recomendada por el mdico. Esto puede incluir evitar ciertos alimentos y bebidas, por ejemplo: ? Caf y t (con o sin cafena). ? Bebidas que contengan alcohol. ? Bebidas energticas y deportivas. ? Bebidas gaseosas o refrescos. ? Chocolate y cacao. ? Menta y esencia de [place]. ? Ajo y cebolla. ? Rbano picante. ? Alimentos condimentados, picantes y cidos, por ejemplo, todos los tipos de pimientas, [place] en polvo, curry en polvo, vinagre,  salsas picantes y [employer] Petroleum. ? Ctricos y sus jugos, por ejemplo, naranjas, limones y limas. ? Alimentos a base de [address], como salsa de [place], [place], salsa picante y pizza con salsa de [place]. ? Alimentos fritos y [place], [place] donas, papas fritas y aderezos ricos en grasas. ? Carnes con alto contenido de grasa, como salchichas, y cortes de carnes rojas y blancas con mucha grasa, por ejemplo, chuletas o costillas, embutidos, jamn y tocino. ? Productos lcteos ricos en grasas, como leche [place], [place] y [place] crema.  Haga comidas pequeas y frecuentes Freight forwarder de comidas abundantes.  Evite beber grandes cantidades de lquidos con las comidas.  Evite comer 2 o 3horas antes de acostarse.  Evite recostarse inmediatamente despus de comer.  No haga ejercicios enseguida despus de comer. Estilo de vida   No consuma ningn producto que contenga nicotina o tabaco, como cigarrillos, cigarrillos electrnicos y tabaco de Theatre manager. Si necesita ayuda para dejar de fumar, consulte al American Express.  Trate de reducir el estrs con mtodos como el yoga o la meditacin. Si necesita ayuda para reducir [employer] de estrs, consulte al mdico.  Si tiene sobrepeso, baje hasta llegar a un peso saludable para usted. Pdale consejos al mdico para bajar de peso de [place] segura. Indicaciones generales  Est atento a cualquier cambio en los  sntomas.  Tome los medicamentos de venta libre y los recetados solamente como se lo haya indicado el mdico. No tome aspirina, ibuprofeno ni otros AINE a menos que el mdico se lo indique.  Use ropa holgada. No use nada apretado alrededor de la cintura que haga presin sobre el abdomen.  Levante (eleve) la cabecera de la cama aproximadamente 6pulgadas (15cm).  Evite inclinarse si al hacerlo empeoran los sntomas.  Concurra a todas las visitas de [address] se lo haya indicado el mdico. Esto es importante. Comunquese con un mdico si:  Tiene los siguientes sntomas: ? Sntomas nuevos. ? Prdida de peso sin causa aparente. ? Dificultad o dolor al tragar. ? Sibilancias o una tos persistente. ? Voz ronca.  Los sntomas no mejoran con Scientist, research (medical). Solicite ayuda inmediatamente si:  [employer], el cuello, la [place], los dientes o la espalda.  Se siente transpirado, mareado o tiene una sensacin de desvanecimiento.  Siente dolor intenso en el pecho o le falta el aire.  Vomita y el vmito tiene un aspecto similar a la sangre o a los posos de caf.  Se desmaya.  Tiene heces sanguinolentas o negras.  No puede tragar, beber o comer. Resumen  El reflujo gastroesofgico ocurre cuando el cido del estmago sube al esfago. La ERGE es una enfermedad en la que el reflujo ocurre con frecuencia, causa sntomas frecuentes o graves, o causa problemas tales como dao en el esfago.  El tratamiento de esta afeccin puede variar segn la gravedad de los sntomas. El mdico puede indicarle que siga una dieta y haga cambios en su estilo de vida, tome medicamentos o se someta a [place].  Comunquese con un mdico si tiene sntomas nuevos o los sntomas empeoran.  Tome los medicamentos de venta libre y los recetados solamente como se lo haya indicado el mdico. No tome aspirina, ibuprofeno ni otros AINE a menos que el mdico se lo indique.  Concurra a todas las  visitas de [address] se lo haya indicado el mdico. Esto es importante. Esta informacin no tiene Theme park manager el consejo del mdico. Asegrese de hacerle al mdico  cualquier pregunta que tenga. Document Revised: 10/10/2017 Document Reviewed: 10/10/2017 Elsevier Patient Education  2020 [employer]

## 2019-06-15 ENCOUNTER — Other Ambulatory Visit: Payer: Self-pay | Admitting: Physician Assistant

## 2019-06-15 ENCOUNTER — Encounter: Payer: Self-pay | Admitting: Physician Assistant

## 2019-06-15 DIAGNOSIS — E039 Hypothyroidism, unspecified: Secondary | ICD-10-CM

## 2019-07-21 ENCOUNTER — Encounter: Payer: Self-pay | Admitting: Physician Assistant

## 2019-07-21 ENCOUNTER — Other Ambulatory Visit: Payer: Self-pay

## 2019-07-21 ENCOUNTER — Ambulatory Visit (INDEPENDENT_AMBULATORY_CARE_PROVIDER_SITE_OTHER): Payer: 59 | Admitting: Physician Assistant

## 2019-07-21 VITALS — BP 120/80 | HR 94 | Temp 97.5°F | Ht 66.0 in | Wt 193.0 lb

## 2019-07-21 DIAGNOSIS — F325 Major depressive disorder, single episode, in full remission: Secondary | ICD-10-CM

## 2019-07-21 DIAGNOSIS — Z79899 Other long term (current) drug therapy: Secondary | ICD-10-CM

## 2019-07-21 DIAGNOSIS — Z0000 Encounter for general adult medical examination without abnormal findings: Secondary | ICD-10-CM

## 2019-07-21 DIAGNOSIS — R7309 Other abnormal glucose: Secondary | ICD-10-CM

## 2019-07-21 DIAGNOSIS — E669 Obesity, unspecified: Secondary | ICD-10-CM

## 2019-07-21 DIAGNOSIS — K219 Gastro-esophageal reflux disease without esophagitis: Secondary | ICD-10-CM

## 2019-07-21 DIAGNOSIS — E559 Vitamin D deficiency, unspecified: Secondary | ICD-10-CM

## 2019-07-21 DIAGNOSIS — Z0001 Encounter for general adult medical examination with abnormal findings: Secondary | ICD-10-CM

## 2019-07-21 DIAGNOSIS — Z136 Encounter for screening for cardiovascular disorders: Secondary | ICD-10-CM

## 2019-07-21 DIAGNOSIS — E039 Hypothyroidism, unspecified: Secondary | ICD-10-CM

## 2019-07-21 DIAGNOSIS — E782 Mixed hyperlipidemia: Secondary | ICD-10-CM

## 2019-07-21 DIAGNOSIS — K760 Fatty (change of) liver, not elsewhere classified: Secondary | ICD-10-CM

## 2019-07-21 DIAGNOSIS — G4700 Insomnia, unspecified: Secondary | ICD-10-CM

## 2019-07-21 NOTE — Progress Notes (Signed)
COMPLETE PHYSICAL  Assessment and Plan:  Gastroesophageal reflux disease, esophagitis presence not specified Well managed by lifestyle/PRN OTC agents Discussed diet, avoiding triggers and other lifestyle changes  Hypothyroidism, unspecified type continue medications the same pending lab results reminded to take on an empty stomach 30-30mins before food.  check TSH level  Vitamin D deficiency Continue supplementation Increase vitamin D, check next OV with insurance  Abnormal glucose Discussed disease and risks Discussed diet/exercise, weight management  Overweight (BMI 25.0-29.9) Long discussion about weight loss, diet, and exercise Recommended diet heavy in fruits and veggies and low in animal meats, cheeses, and dairy products, appropriate calorie intake Discussed appropriate weight for height  Follow up at next visit  Mixed hyperlipidemia Currently with mild elevations; attempting lifestyle modification; initiate statin as indicated Continue low cholesterol diet and exercise.  Check lipid panel next OV due to lack of insurance  Depression, major, in remission ([place]) Lifestyle discussed: diet/exerise, sleep hygiene, stress management, hydration  Insomnia, unspecified type Good sleep hygiene discussed, increase day time activity  Orders Placed This Encounter  Procedures  . CBC with Differential/Platelet  . COMPLETE METABOLIC PANEL WITH GFR  . TSH  . Lipid panel  . Hemoglobin A1c  . VITAMIN D 25 Hydroxy (Vit-D Deficiency, Fractures)    Discussed med's effects and SE's. Screening labs and tests as requested with regular follow-up as recommended.  Future Appointments  Date Time Provider [place]  01/26/2020  8:45 AM Vicie Mutters, PA-C [facility] None  07/27/2020  9:00 AM Vicie Mutters, PA-C [facility] None     HPI  59 y.o. female. 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   BMI is Body mass index is 31.15 kg/m., she has been working on diet and exercise. Wt Readings from Last 3 Encounters:  07/21/19 193 lb (87.5 kg)  05/26/19 197 lb (89.4 kg)  12/15/18 192 lb (87.1 kg)   Today their BP is BP: 120/80 She does workout. She denies chest pain, shortness of breath, dizziness.     She is not on cholesterol medication and denies myalgias. Her cholesterol is not at goal. The cholesterol last visit was:   Lab Results  Component Value Date   CHOL 205 (H) 07/21/2019   HDL 50 07/21/2019   LDLCALC 130 (H) 07/21/2019   TRIG 142 07/21/2019   CHOLHDL 4.1 07/21/2019   She has been working on diet and exercise for prediabetes, she is not on bASA, she is not on ACE/ARB (BP controlled) and denies foot ulcerations, increased appetite, nausea, paresthesia of the feet, polydipsia, polyuria, visual disturbances, vomiting and weight loss. Last A1C in the office was:  Lab Results  Component Value Date   HGBA1C 5.7 (H) 07/21/2019   She is on thyroid medication she is on 1 pill a day but admits that she forgets.  Lab Results  Component Value Date   TSH 2.66 07/21/2019   Last GFR: Lab Results  Component Value Date   GFRNONAA 95 07/21/2019   Patient is on Vitamin D supplement, she is on 5000 IU every day.    Lab Results  Component Value Date   VD25OH 30 07/21/2019      Current Medications:  Current Outpatient Medications on File Prior to Visit  Medication Sig Dispense Refill  .  Calcium Carbonate (CALCIUM 600 PO) Take by mouth.    . Cholecalciferol (VITAMIN D PO) Take 5,000 Int'l Units by mouth daily.    . Cyanocobalamin (VITAMIN B12 SL) Place under the tongue.    . IRON PO Take by mouth daily.    Marland Kitchen levothyroxine (EUTHYROX) 125  MCG tablet Take 1 tablet daily on an empty stomach with only water for 30 minutes & no Antacid meds, Calcium or Magnesium for 4 hours & avoid Biotin 90 tablet 0  . omeprazole (PRILOSEC) 40 MG capsule Take 1 capsule (40 mg total) by mouth daily. 30 capsule 3  . OVER THE COUNTER MEDICATION daily. Potassium- pt unsure of dose    . triamcinolone (NASACORT) 55 MCG/ACT AERO nasal inhaler Place 2 sprays into the nose at bedtime. 1 Inhaler 3   No current facility-administered medications on file prior to visit.   Allergies:  Allergies  Allergen Reactions  . Atorvastatin     Fatigue    Medical History:  She has Abnormal glucose; Hyperlipidemia; Vitamin D deficiency; Hypothyroidism; Depression, major, in remission ([facility]); GERD (gastroesophageal reflux disease); Medication management; Obesity (BMI 30.0-34.9); Insomnia; and Fatty liver on their problem list. Health Maintenance:   Immunization History  Administered Date(s) Administered  . Influenza Split 02/08/2014  . Influenza, Seasonal, Injecte, Preservative Fre 02/21/2015  . Td 03/12/2002  . Tdap 02/08/2014   Tetanus: 2015  Flu vaccine: 2019 declined this year- husband lost job Shingrix: declines  LMP: No LMP recorded. (Menstrual status: Perimenopausal). Pap: 08/2017 MGM: 12/2018-  Colonoscopy: 2019 can't find report - patient reports she has documentation, will present/mail EGD: n/a  Last Dental Exam: 2019, goes q6 month Last Eye Exam: Remote, will schedule  Patient Care Team: Lucky Cowboy, MD as PCP - General (Internal Medicine) Dorisann Frames, MD as Consulting Physician (Endocrinology)  Surgical History:  She has a past surgical history that includes thyroid ablation (2008). Family History:  Herfamily history includes Aneurysm in her brother and sister; Arthritis in her sister; Breast cancer in her mother; Heart attack (age of onset: 63) in her paternal grandfather; Hyperlipidemia in her father and mother; Hypertension in her  mother. Mother with pacemaker.  Social History:  She reports that she has never smoked. She has never used smokeless tobacco. She reports current alcohol use. She reports that she does not use drugs.  Review of Systems: Review of Systems  Constitutional: Negative for malaise/fatigue and weight loss.  HENT: Negative for hearing loss and tinnitus.   Eyes: Negative for blurred vision and double vision.  Respiratory: Negative for cough, sputum production, shortness of breath and wheezing.   Cardiovascular: Negative for chest pain, palpitations, orthopnea, claudication, leg swelling and PND.  Gastrointestinal: Negative for abdominal pain, blood in stool, constipation, diarrhea, heartburn, melena, nausea and vomiting.  Genitourinary: Negative.   Musculoskeletal: Negative for falls, joint pain and myalgias.  Skin: Negative for rash.  Neurological: Negative for dizziness, tingling, sensory change, weakness and headaches.  Endo/Heme/Allergies: Negative for polydipsia.  Psychiatric/Behavioral: Negative.  Negative for depression, memory loss, substance abuse and suicidal ideas. The patient is not nervous/anxious and does not have insomnia.   All other systems reviewed and are negative.   Physical Exam: Estimated body mass index is 31.15 kg/m as calculated from the following:   Height as of this encounter: 5\' 6"  (1.676 m).   Weight as of this encounter: 193 lb (87.5 kg). BP 120/80   Pulse 94   Temp (!) 97.5 F (36.4 C)   Ht 5\' 6"  (  1.676 m)   Wt 193 lb (87.5 kg)   SpO2 98%   BMI 31.15 kg/m  General Appearance: Well nourished, in no apparent distress.  Eyes: PERRLA, EOMs, conjunctiva no swelling or erythema, normal fundi and vessels.  Sinuses: No Frontal/maxillary tenderness  ENT/Mouth: Ext aud canals clear, normal light reflex with TMs without erythema, bulging. Good dentition. No erythema, swelling, or exudate on post pharynx. Tonsils not swollen or erythematous. Hearing normal.  Neck:  Supple, thyroid normal. No bruits  Respiratory: Respiratory effort normal, BS equal bilaterally without rales, rhonchi, wheezing or stridor.  Cardio: RRR without murmurs, rubs or gallops. Brisk peripheral pulses without edema.  Chest: symmetric, with normal excursions and percussion.  Breasts: Symmetric, without lumps, nipple discharge, retractions.  Abdomen: Soft, nontender, no guarding, rebound, hernias, masses, or organomegaly.  Lymphatics: Non tender without lymphadenopathy.  Genitourinary: Normal internal and external structures Musculoskeletal: Full ROM all peripheral extremities,5/5 strength, and normal gait.  Skin: Warm, dry without rashes, lesions, ecchymosis. Neuro: Cranial nerves intact, reflexes equal bilaterally. Normal muscle tone, no cerebellar symptoms. Sensation intact.  Psych: Awake and oriented X 3, normal affect, Insight and Judgment appropriate.    [name] 2:06 PM [facility]

## 2019-07-21 NOTE — Patient Instructions (Signed)
General eating tips  What to Avoid . Avoid added sugars o Often added sugar can be found in processed foods such as many condiments, dry cereals, cakes, cookies, chips, crisps, crackers, candies, sweetened drinks, etc.  o Read labels and AVOID/DECREASE use of foods with the following in their ingredient list: Sugar, fructose, high fructose corn syrup, sucrose, glucose, maltose, dextrose, molasses, cane sugar, brown sugar, any type of syrup, agave nectar, etc.   . Avoid snacking in between meals- drink water or if you feel you need a snack, pick a high water content snack such as cucumbers, watermelon, or any veggie.  Marland Kitchen Avoid foods made with flour o If you are going to eat food made with flour, choose those made with whole-grains; and, minimize your consumption as much as is tolerable . Avoid processed foods o These foods are generally stocked in the middle of the grocery store.  o Focus on shopping on the perimeter of the grocery.  What to Include . Vegetables o GREEN LEAFY VEGETABLES: Kale, spinach, mustard greens, collard greens, cabbage, broccoli, etc. o OTHER: Asparagus, cauliflower, eggplant, carrots, peas, Brussel sprouts, tomatoes, bell peppers, zucchini, beets, cucumbers, etc. . Grains, seeds, and legumes o Beans: kidney beans, black eyed peas, garbanzo beans, black beans, pinto beans, etc. o Whole, unrefined grains: brown rice, barley, bulgur, oatmeal, etc. . Healthy fats  o Avoid highly processed fats such as vegetable oil o Examples of healthy fats: avocado, olives, virgin olive oil, dark chocolate (?72% Cocoa), nuts (peanuts, almonds, walnuts, cashews, pecans, etc.) o Please still do small amount of these healthy fats, they are dense in calories.  . Low - Moderate Intake of Animal Sources of Protein o Meat sources: chicken, [place], salmon, tuna. Limit to 4 ounces of meat at one time or the size of your palm. o Consider limiting dairy sources, but when choosing dairy focus on:  PLAIN [place] yogurt, cottage cheese, high-protein milk . Fruit o Choose berries   WOMEN AND HEART ATTACKS  Being a woman you may not have the typical symptoms of a heart attack.  You may not have any pain OR you may have atypical pain such as jaw pain, upper back pain, arm pain, "my bra feels to tight" and you will often have symptoms with it like below.  Symptoms for a heart attack will likely occur when you exert your self or exercise and include: Shortness of breath Sweating Nausea Dizziness Fast or irregular heart beats Fatigue   It makes me feel better if my patients get their heart rate up with exercise once or twice a week and pay close attention to your body. If there is ANY change in your exercise capacity or if you have symptoms above, please STOP and call 911 or call to come to the office.   Here is some information to help you keep your heart healthy: Move it! - Aim for 30 mins of activity every day. Take it slowly at first. Talk to [place] before starting any new exercise program.   Lose it.  -Body Mass Index (BMI) can indicate if you need to lose weight. A healthy range is 18.5-24.9. For a BMI calculator, go to [email or web address]  Waist Management -Excess abdominal fat is a risk factor for heart disease, diabetes, asthma, stroke and more. Ideal waist circumference is less than 35" for women and less than 40" for men.   Eat Right -focus on fruits, vegetables, whole grains, and meals you make yourself. Avoid foods with trans fat and  high sugar/sodium content.   Snooze or Snore? - Loud snoring can be a sign of sleep apnea, a significant risk factor for high blood pressure, heart attach, stroke, and heart arrhythmias.  Kick the habit -Quit Smoking! Avoid second hand smoke. A single cigarette raises your blood pressure for 20 mins and increases the risk of heart attack and stroke for the next 24 hours.   Are Aspirin and Supplements right for you? -Add ENTERIC COATED low dose 81  mg Aspirin daily OR can do every other day if you have easy bruising to protect your heart and head. As well as to reduce risk of Colon Cancer by 20 %, Skin Cancer by 26 % , Melanoma by 46% and Pancreatic cancer by 60%  Say "No to Stress -There may be little you can do about problems that cause stress. However, techniques such as long walks, meditation, and exercise can help you manage it.   Start Now! - Make changes one at a time and set reasonable goals to increase your likelihood of success.

## 2019-07-22 LAB — COMPLETE METABOLIC PANEL WITH GFR
AG Ratio: 1.9 (calc) (ref 1.0–2.5)
ALT: 38 U/L — ABNORMAL HIGH (ref 6–29)
AST: 25 U/L (ref 10–35)
Albumin: 4.6 g/dL (ref 3.6–5.1)
Alkaline phosphatase (APISO): 84 U/L (ref 37–153)
BUN: 16 mg/dL (ref 7–25)
CO2: 26 mmol/L (ref 20–32)
Calcium: 10 mg/dL (ref 8.6–10.4)
Chloride: 105 mmol/L (ref 98–110)
Creat: 0.71 mg/dL (ref 0.50–1.05)
GFR, Est African American: 110 mL/min/{1.73_m2} (ref >=60)
GFR, Est Non African American: 95 mL/min/{1.73_m2} (ref >=60)
Globulin: 2.4 g/dL (calc) (ref 1.9–3.7)
Glucose, Bld: 104 mg/dL — ABNORMAL HIGH (ref 65–99)
Potassium: 4.3 mmol/L (ref 3.5–5.3)
Sodium: 139 mmol/L (ref 135–146)
Total Bilirubin: 0.4 mg/dL (ref 0.2–1.2)
Total Protein: 7 g/dL (ref 6.1–8.1)

## 2019-07-22 LAB — CBC WITH DIFFERENTIAL/PLATELET
Absolute Monocytes: 380 cells/uL (ref 200–950)
Basophils Absolute: 30 cells/uL (ref 0–200)
Basophils Relative: 0.4 %
Eosinophils Absolute: 61 cells/uL (ref 15–500)
Eosinophils Relative: 0.8 %
HCT: 40 % (ref 35.0–45.0)
Hemoglobin: 13.5 g/dL (ref 11.7–15.5)
Lymphs Abs: 2212 cells/uL (ref 850–3900)
MCH: 31.4 pg (ref 27.0–33.0)
MCHC: 33.8 g/dL (ref 32.0–36.0)
MCV: 93 fL (ref 80.0–100.0)
MPV: 12.2 fL (ref 7.5–12.5)
Monocytes Relative: 5 %
Neutro Abs: 4917 cells/uL (ref 1500–7800)
Neutrophils Relative %: 64.7 %
Platelets: 222 10*3/uL (ref 140–400)
RBC: 4.3 10*6/uL (ref 3.80–5.10)
RDW: 11.9 % (ref 11.0–15.0)
Total Lymphocyte: 29.1 %
WBC: 7.6 10*3/uL (ref 3.8–10.8)

## 2019-07-22 LAB — HEMOGLOBIN A1C
Hgb A1c MFr Bld: 5.7 % of total Hgb — ABNORMAL HIGH (ref <5.7)
Mean Plasma Glucose: 117 (calc)
eAG (mmol/L): 6.5 (calc)

## 2019-07-22 LAB — LIPID PANEL
Cholesterol: 205 mg/dL — ABNORMAL HIGH (ref <200)
HDL: 50 mg/dL (ref >=50)
LDL Cholesterol (Calc): 130 mg/dL (calc) — ABNORMAL HIGH
Non-HDL Cholesterol (Calc): 155 mg/dL (calc) — ABNORMAL HIGH (ref <130)
Total CHOL/HDL Ratio: 4.1 (calc) (ref <5.0)
Triglycerides: 142 mg/dL (ref <150)

## 2019-07-22 LAB — VITAMIN D 25 HYDROXY (VIT D DEFICIENCY, FRACTURES): Vit D, 25-Hydroxy: 30 ng/mL (ref 30–100)

## 2019-07-22 LAB — TSH: TSH: 2.66 mIU/L (ref 0.40–4.50)

## 2019-08-25 ENCOUNTER — Encounter: Payer: BLUE CROSS/BLUE SHIELD | Admitting: Physician Assistant

## 2019-09-13 ENCOUNTER — Other Ambulatory Visit: Payer: Self-pay | Admitting: Internal Medicine

## 2019-09-13 DIAGNOSIS — E039 Hypothyroidism, unspecified: Secondary | ICD-10-CM

## 2019-09-13 MED ORDER — LEVOTHYROXINE SODIUM 125 MCG PO TABS: ORAL_TABLET | ORAL | 0 refills | Status: DC

## 2019-12-14 ENCOUNTER — Other Ambulatory Visit: Payer: Self-pay

## 2019-12-14 ENCOUNTER — Other Ambulatory Visit: Payer: Self-pay | Admitting: Internal Medicine

## 2019-12-14 DIAGNOSIS — E039 Hypothyroidism, unspecified: Secondary | ICD-10-CM

## 2019-12-14 MED ORDER — LEVOTHYROXINE SODIUM 125 MCG PO TABS: ORAL_TABLET | ORAL | 1 refills | Status: DC

## 2020-01-26 ENCOUNTER — Ambulatory Visit: Payer: 59 | Admitting: Adult Health Nurse Practitioner

## 2020-02-19 ENCOUNTER — Other Ambulatory Visit: Payer: Self-pay

## 2020-02-19 ENCOUNTER — Ambulatory Visit: Payer: 59 | Admitting: Adult Health Nurse Practitioner

## 2020-02-19 ENCOUNTER — Encounter: Payer: Self-pay | Admitting: Adult Health Nurse Practitioner

## 2020-02-19 VITALS — BP 128/86 | HR 61 | Temp 98.1°F | Wt 193.0 lb

## 2020-02-19 DIAGNOSIS — F325 Major depressive disorder, single episode, in full remission: Secondary | ICD-10-CM

## 2020-02-19 DIAGNOSIS — R7309 Other abnormal glucose: Secondary | ICD-10-CM

## 2020-02-19 DIAGNOSIS — R300 Dysuria: Secondary | ICD-10-CM | POA: Diagnosis not present

## 2020-02-19 DIAGNOSIS — E669 Obesity, unspecified: Secondary | ICD-10-CM

## 2020-02-19 DIAGNOSIS — E039 Hypothyroidism, unspecified: Secondary | ICD-10-CM | POA: Diagnosis not present

## 2020-02-19 DIAGNOSIS — E66811 Obesity, class 1: Secondary | ICD-10-CM

## 2020-02-19 DIAGNOSIS — E782 Mixed hyperlipidemia: Secondary | ICD-10-CM | POA: Diagnosis not present

## 2020-02-19 DIAGNOSIS — E559 Vitamin D deficiency, unspecified: Secondary | ICD-10-CM

## 2020-02-19 DIAGNOSIS — R42 Dizziness and giddiness: Secondary | ICD-10-CM

## 2020-02-19 DIAGNOSIS — Z79899 Other long term (current) drug therapy: Secondary | ICD-10-CM

## 2020-02-19 DIAGNOSIS — G4700 Insomnia, unspecified: Secondary | ICD-10-CM

## 2020-02-19 DIAGNOSIS — K219 Gastro-esophageal reflux disease without esophagitis: Secondary | ICD-10-CM

## 2020-02-19 NOTE — Patient Instructions (Signed)
    Start taking cetirizine (Zyrtec) every night.  This is an antihistamine to help dry up fluid in your ears.  We are going to check you r urine today for any bacteria.  We will call you with the lab results.  This will likely be Monday.   Increase the amount of water you drink.      GENERAL HEALTH GOALS  Know what a healthy weight is for you (roughly BMI <25) and aim to maintain this  Aim for 7+ servings of fruits and vegetables daily  70-80+ fluid ounces of water or unsweet tea for healthy kidneys  Limit to max 1 drink of alcohol per day; avoid smoking/tobacco  Limit animal fats in diet for cholesterol and heart health - choose grass fed whenever available  Avoid highly processed foods, and foods high in saturated/trans fats  Aim for low stress - take time to unwind and care for your mental health  Aim for 150 min of moderate intensity exercise weekly for heart health, and weights twice weekly for bone health  Aim for 7-9 hours of sleep daily

## 2020-02-19 NOTE — Progress Notes (Signed)
FOLLOW UP 6 MONTH   Assessment and Plan: Marisue Ivan was seen today for follow-up.  Diagnoses and all orders for this visit:  Hypothyroidism, unspecified type continue medications the same pending lab results reminded to take on an empty stomach 30-81mins before food.  check TSH level  Gastroesophageal reflux disease, esophagitis presence not specified Well managed by lifestyle/PRN OTC agents Discussed diet, avoiding triggers and other lifestyle changes   Dysuria -     Urinalysis w microscopic + reflex cultur -     Urine Culture -     REFLEXIVE URINE CULTURE  Dizziness  Discussed differentials with patient & husband. Try OTC Zyrtec nightly -     CBC with Differential/Platelet -     COMPLETE METABOLIC PANEL WITH GFR -     Iron,Total/Total Iron Binding Cap -     TSH  Depression, major, in remission (HCC) No medication at this time, doing well Discussed stress management techniques  Discussed, increase water,intake & good sleep hygiene  Discussed increasing exercise & vegetables in diet  Mixed hyperlipidemia No medications: Discussed dietary and exercise modifications Low fat diet   Obesity (BMI 30.0-34.9) Discussed dietary and exercise modifications  Vitamin D deficiency Continue supplementation to maintain goal of 70-100 Taking Vitamin D 5,000 IU daily Defer vitamin D level   Abnormal glucose Discussed dietary and exercise modifications  Insomnia, unspecified type Discussed good sleep hygiene, decrease stimulation prior to sleep Increase day time activity Avoid caffeine in evenings Try OTC Melatonin 10mg  We can consider a sleep aid if these do not work  Medication management Continued    Discussed med's effects and SE's. Screening labs and tests as requested with regular follow-up as recommended.  Further disposition pending results if labs check today. Discussed med's effects and SE's.   Over 30 minutes of face to face interview, exam, counseling, chart  review, and critical decision making was performed.   Future Appointments  Date Time Provider Department Center  07/27/2020  9:00 AM 07/29/2020, NP [facility] None     HPI  59 y.o. female. 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   Reports she has been dizzy and lost her balance and her speech was slow.  She reports this happen three days ago.  She had finished eating  And she was doing some cleaning around the house and reports every time she bend over she felt dizzy and it scared her. It last about 3-4 hours.  She did not want to bend over, if she was standing it was ok.  She reports it resolved and she was alble to lay down to sleep that night.  She reports it did not wake her from sleep.  She has not had anything like this before.  She has ringing in her ears it is worse on the right vs left.  She reports it is worse when she is ;laying on that side of when she is under water. She reports sensitivity from minimal noises.  She also has concerns about a short sharp feeling in her lower abdomen.  She is bracing across from hip to hip.  She reports mainly when she lays down at night and describes this as a jolt or shock feeling.  It is quick and does not prevent her from  sleep but she is concerned about it.  She is having routine bowel movements every 1-2 days.  Denies any constant pain and reports she does not feel this when she is up moving for the day.  BMI is Body mass index is 31.15 kg/m., she has been working on diet and exercise. Wt Readings from Last 3 Encounters:  02/19/20 193 lb (87.5 kg)  07/21/19 193 lb (87.5 kg)  05/26/19 197 lb (89.4 kg)   Today their BP is BP:  128/86 She does workout. She denies chest pain, shortness of breath, dizziness.    She is not on cholesterol medication and denies myalgias. Her cholesterol is not at goal. The cholesterol last visit was:   Lab Results  Component Value Date   CHOL 205 (H) 07/21/2019   HDL 50 07/21/2019   LDLCALC 130 (H) 07/21/2019   TRIG 142 07/21/2019   CHOLHDL 4.1 07/21/2019   She has been working on diet and exercise for prediabetes, she is not on bASA, she is not on ACE/ARB (BP controlled) and denies foot ulcerations, increased appetite, nausea, paresthesia of the feet, polydipsia, polyuria, visual disturbances, vomiting and weight loss. Last A1C in the office was:  Lab Results  Component Value Date   HGBA1C 5.7 (H) 07/21/2019   She is on thyroid medication she is on 1 pill a day but admits that she forgets.  Lab Results  Component Value Date   TSH 2.66 07/21/2019   Last GFR: Lab Results  Component Value Date   GFRNONAA 95 07/21/2019   Patient is on Vitamin D supplement, she is on 5000 IU every day.    Lab Results  Component Value Date   VD25OH 30 07/21/2019      Current Medications:  Current Outpatient Medications on File Prior to Visit  Medication Sig Dispense Refill  . Calcium Carbonate (CALCIUM 600 PO) Take by mouth.    . Cholecalciferol (VITAMIN D PO) Take 5,000 Int'l Units by mouth daily.    . Cyanocobalamin (VITAMIN B12 SL) Place under the tongue.    . IRON PO Take by mouth daily.    Marland Kitchen levothyroxine (SYNTHROID) 125 MCG tablet TAKE 1 TABLET BY MOUTH IN THE MORNING 30 MINUTES PRIOR TO ANY OTHER MEDICATION AND WITH WATER 90 tablet 0  . omeprazole (PRILOSEC) 40 MG capsule Take 1 capsule (40 mg total) by mouth daily. 30 capsule 3  . OVER THE COUNTER MEDICATION daily. Potassium- pt unsure of dose    . triamcinolone (NASACORT) 55 MCG/ACT AERO nasal inhaler Place 2 sprays into the nose at bedtime. 1 Inhaler 3   No current facility-administered medications on file prior to visit.    Allergies:  Allergies  Allergen Reactions  . Atorvastatin     Fatigue    Medical History:  She has Abnormal glucose; Hyperlipidemia; Vitamin D deficiency; Hypothyroidism; Depression, major, in remission (HCC); GERD (gastroesophageal reflux disease); Medication management; Obesity (BMI 30.0-34.9); Insomnia; and Fatty liver on their problem list. Health Maintenance:   Immunization History  Administered Date(s) Administered  . Influenza Split 02/08/2014  . Influenza, Seasonal, Injecte, Preservative Fre 02/21/2015  . PFIZER SARS-COV-2 Vaccination 08/09/2019  . Td 03/12/2002  . Tdap 02/08/2014   Tetanus: 2015  Flu vaccine: 2019 declined this year- husband lost job Shingrix: declines  LMP: No LMP recorded. (Menstrual status: Perimenopausal). Pap: 08/2017 MGM: 12/2018-  Colonoscopy: 2019 can't find report - patient reports she has documentation, will present/mail EGD: n/a  Last Dental Exam: 2019, goes q6 month Last Eye  Exam: Remote, will schedule  Patient Care Team: Lucky Cowboy, MD as PCP - General (Internal Medicine) Dorisann Frames, MD as Consulting Physician (Endocrinology)  Surgical History:  She has a past surgical history that includes thyroid ablation (2008). Family History:  Herfamily history includes Aneurysm in her brother and sister; Arthritis in her sister; Breast cancer in her mother; Heart attack (age of onset: 17) in her paternal grandfather; Hyperlipidemia in her father and mother; Hypertension in her mother. Mother with pacemaker.  Social History:  She reports that she has never smoked. She has never used smokeless tobacco. She reports current alcohol use. She reports that she does not use drugs.  Review of Systems: Review of Systems  Constitutional: Negative for malaise/fatigue and weight loss.  HENT: Negative for hearing loss and tinnitus.   Eyes: Negative for blurred vision and double vision.  Respiratory: Negative for cough, sputum production,  shortness of breath and wheezing.   Cardiovascular: Negative for chest pain, palpitations, orthopnea, claudication, leg swelling and PND.  Gastrointestinal: Negative for abdominal pain, blood in stool, constipation, diarrhea, heartburn, melena, nausea and vomiting.  Genitourinary: Negative.   Musculoskeletal: Negative for falls, joint pain and myalgias.  Skin: Negative for rash.  Neurological: Negative for dizziness, tingling, sensory change, weakness and headaches.  Endo/Heme/Allergies: Negative for polydipsia.  Psychiatric/Behavioral: Negative.  Negative for depression, memory loss, substance abuse and suicidal ideas. The patient is not nervous/anxious and does not have insomnia.   All other systems reviewed and are negative.   Physical Exam: Estimated body mass index is 31.15 kg/m as calculated from the following:   Height as of 07/21/19: 5\' 6"  (1.676 m).   Weight as of this encounter: 193 lb (87.5 kg). BP 128/86   Pulse 61   Temp 98.1 F (36.7 C)   Wt 193 lb (87.5 kg)   SpO2 98%   BMI 31.15 kg/m  General Appearance: Well nourished, in no apparent distress.  Eyes: PERRLA, EOMs, conjunctiva no swelling or erythema, normal fundi and vessels.  Sinuses: No Frontal/maxillary tenderness  ENT/Mouth: Ext aud canals clear, normal light reflex with TMs without erythema, bulging. Good dentition. No erythema, swelling, or exudate on post pharynx. Tonsils not swollen or erythematous. Hearing normal.  Neck: Supple, thyroid normal. No bruits  Respiratory: Respiratory effort normal, BS equal bilaterally without rales, rhonchi, wheezing or stridor.  Cardio: RRR without murmurs, rubs or gallops. Brisk peripheral pulses without edema.  Chest: symmetric, with normal excursions and percussion.  Breasts: Symmetric, without lumps, nipple discharge, retractions.  Abdomen: Soft, nontender, no guarding, rebound, hernias, masses, or organomegaly.  Lymphatics: Non tender without lymphadenopathy.   Genitourinary: Normal internal and external structures Musculoskeletal: Full ROM all peripheral extremities,5/5 strength, and normal gait.  Skin: Warm, dry without rashes, lesions, ecchymosis. Neuro: Cranial nerves intact, reflexes equal bilaterally. Normal muscle tone, no cerebellar symptoms. Sensation intact.  Psych: Awake and oriented X 3, normal affect, Insight and Judgment appropriate.    , NP 10:47 AM [facility]

## 2020-02-21 LAB — URINALYSIS W MICROSCOPIC + REFLEX CULTURE
Bacteria, UA: NONE SEEN /HPF
Bilirubin Urine: NEGATIVE
Glucose, UA: NEGATIVE
Hgb urine dipstick: NEGATIVE
Hyaline Cast: NONE SEEN /LPF
Ketones, ur: NEGATIVE
Nitrites, Initial: NEGATIVE
Protein, ur: NEGATIVE
Specific Gravity, Urine: 1.021 (ref 1.001–1.03)
pH: 5.5 (ref 5.0–8.0)

## 2020-02-21 LAB — CBC WITH DIFFERENTIAL/PLATELET
Absolute Monocytes: 392 cells/uL (ref 200–950)
Basophils Absolute: 21 cells/uL (ref 0–200)
Basophils Relative: 0.3 %
Eosinophils Absolute: 112 cells/uL (ref 15–500)
Eosinophils Relative: 1.6 %
HCT: 38.6 % (ref 35.0–45.0)
Hemoglobin: 13.4 g/dL (ref 11.7–15.5)
Lymphs Abs: 2114 cells/uL (ref 850–3900)
MCH: 31.3 pg (ref 27.0–33.0)
MCHC: 34.7 g/dL (ref 32.0–36.0)
MCV: 90.2 fL (ref 80.0–100.0)
MPV: 12.1 fL (ref 7.5–12.5)
Monocytes Relative: 5.6 %
Neutro Abs: 4361 cells/uL (ref 1500–7800)
Neutrophils Relative %: 62.3 %
Platelets: 210 10*3/uL (ref 140–400)
RBC: 4.28 10*6/uL (ref 3.80–5.10)
RDW: 11.8 % (ref 11.0–15.0)
Total Lymphocyte: 30.2 %
WBC: 7 10*3/uL (ref 3.8–10.8)

## 2020-02-21 LAB — COMPLETE METABOLIC PANEL WITH GFR
AG Ratio: 1.9 (calc) (ref 1.0–2.5)
ALT: 23 U/L (ref 6–29)
AST: 19 U/L (ref 10–35)
Albumin: 4.6 g/dL (ref 3.6–5.1)
Alkaline phosphatase (APISO): 81 U/L (ref 37–153)
BUN: 14 mg/dL (ref 7–25)
CO2: 23 mmol/L (ref 20–32)
Calcium: 9.7 mg/dL (ref 8.6–10.4)
Chloride: 106 mmol/L (ref 98–110)
Creat: 0.69 mg/dL (ref 0.50–1.05)
GFR, Est African American: 112 mL/min/{1.73_m2} (ref >=60)
GFR, Est Non African American: 97 mL/min/{1.73_m2} (ref >=60)
Globulin: 2.4 g/dL (calc) (ref 1.9–3.7)
Glucose, Bld: 99 mg/dL (ref 65–99)
Potassium: 4.2 mmol/L (ref 3.5–5.3)
Sodium: 141 mmol/L (ref 135–146)
Total Bilirubin: 0.3 mg/dL (ref 0.2–1.2)
Total Protein: 7 g/dL (ref 6.1–8.1)

## 2020-02-21 LAB — URINE CULTURE
MICRO NUMBER:: 11306163
SPECIMEN QUALITY:: ADEQUATE

## 2020-02-21 LAB — IRON, TOTAL/TOTAL IRON BINDING CAP
%SAT: 31 % (calc) (ref 16–45)
Iron: 101 ug/dL (ref 45–160)
TIBC: 324 mcg/dL (calc) (ref 250–450)

## 2020-02-21 LAB — TSH: TSH: 3.83 mIU/L (ref 0.40–4.50)

## 2020-02-21 LAB — CULTURE INDICATED

## 2020-02-29 NOTE — Progress Notes (Signed)
PATIENT'S HUSBAND IS AWARE OF LAB RESULTS. SAYS SHE HASN'T HAD ANYMORE "DIZZY" EPISODES BUT STILL FEELS "JITTERY AND "SHOCK SENSATION" THROUGHOUT HER BODY. SHE HAS BEEN TAKING ZYRTEC DAILY. I TOLD MR. Funke THAT I WOULD MAKE YOU AWARE AND SOMEONE FROM OUR OFFICE WOULD REACH OUT WITH YOU RECOMMENDATION. [facility]

## 2020-03-02 ENCOUNTER — Other Ambulatory Visit: Payer: Self-pay | Admitting: Adult Health Nurse Practitioner

## 2020-03-02 DIAGNOSIS — M792 Neuralgia and neuritis, unspecified: Secondary | ICD-10-CM

## 2020-03-02 MED ORDER — GABAPENTIN 100 MG PO CAPS: ORAL_CAPSULE | ORAL | 2 refills | Status: DC

## 2020-03-02 NOTE — Progress Notes (Signed)
PATIENTS HUSBAND IS AWARE OF RX BEING SENT IN AND HOW THE PATIENT SHOULD TAKE THIS MEDICATION. [facility] [place]

## 2020-07-20 ENCOUNTER — Encounter: Payer: 59 | Admitting: Physician Assistant

## 2020-07-25 DIAGNOSIS — E538 Deficiency of other specified B group vitamins: Secondary | ICD-10-CM | POA: Insufficient documentation

## 2020-07-25 NOTE — Progress Notes (Deleted)
COMPLETE PHYSICAL  Assessment and Plan:   Encounter for Routine Physical Exam Due annually   Gastroesophageal reflux disease, esophagitis presence not specified Well managed by lifestyle/PRN OTC agents *** Discussed diet, avoiding triggers and other lifestyle changes  Hypothyroidism, unspecified type continue medications the same pending lab results reminded to take on an empty stomach 30-91mins before food.  check TSH level  Vitamin D deficiency Continue supplementation Increase vitamin D, check next OV with insurance  Abnormal glucose Discussed disease and risks Discussed diet/exercise, weight management - A1C  Obesity - BMI *** Long discussion about weight loss, diet, and exercise Recommended diet heavy in fruits and veggies and low in animal meats, cheeses, and dairy products, appropriate calorie intake Discussed appropriate weight for height  Follow up at next visit  Mixed hyperlipidemia Currently with mild elevations; attempting lifestyle modification; initiate statin as indicated if LDL above 130 Continue low cholesterol diet and exercise.  Check lipid panel next OV due to lack of insurance  Depression, major, in remission (HCC) Doing well off of meds;  Lifestyle discussed: diet/exerise, sleep hygiene, stress management, hydration  Insomnia, unspecified type Good sleep hygiene discussed, increase day time activity Gabapentin helpful ***  B12 def ***  Breast cancer screening - mammogram ordered to schedule  No orders of the defined types were placed in this encounter.   Discussed med's effects and SE's. Screening labs and tests as requested with regular follow-up as recommended.  Future Appointments  Date Time Provider Department Center  07/27/2020  9:00 AM Judd Gaudier, NP [facility] None     HPI  60 y.o. female. 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   Depression in remission ***  GERD controlled on   BMI is There is no height or weight on file to calculate BMI., she has been working on diet and exercise.  She has fatty liver per [place] in 10/2009.  Wt Readings from Last 3 Encounters:  02/19/20 193 lb (87.5 kg)  07/21/19 193 lb (87.5 kg)  05/26/19 197 lb (89.4 kg)   Today their BP is   She does workout. She denies chest pain, shortness of breath, dizziness.   She is not on cholesterol medication and denies myalgias. Her cholesterol is not at goal. The cholesterol last visit was:   Lab Results  Component Value Date   CHOL 205 (H) 07/21/2019   HDL 50 07/21/2019   LDLCALC 130 (H) 07/21/2019   TRIG 142 07/21/2019   CHOLHDL 4.1 07/21/2019   She has been working on diet and exercise for prediabetes, she is not on bASA, she is not on ACE/ARB (BP controlled) and denies foot ulcerations, increased appetite, nausea, paresthesia of the feet, polydipsia, polyuria, visual disturbances, vomiting and weight loss. Last A1C in the office was:  Lab Results  Component Value Date   HGBA1C 5.7 (H) 07/21/2019   She is on thyroid medication she is on 1 pill a day but admits that she forgets.  Lab Results  Component Value Date   TSH 3.83 02/19/2020   Last GFR: Lab Results  Component Value Date   GFRNONAA 97 02/19/2020   Patient is on Vitamin D supplement, she is on 5000 IU every day.    Lab Results  Component Value Date   VD25OH  30 07/21/2019     ON iron supplement *** Lab Results  Component Value Date   IRON 101 02/19/2020   TIBC 324 02/19/2020   FERRITIN 161 02/08/2014      *** Lab Results  Component Value Date   VITAMINB12 368 08/12/2017      Current Medications:  Current Outpatient Medications on  File Prior to Visit  Medication Sig Dispense Refill  . Calcium Carbonate (CALCIUM 600 PO) Take by mouth.    . Cholecalciferol (VITAMIN D PO) Take 5,000 Int'l Units by mouth daily.    . Cyanocobalamin (VITAMIN B12 SL) Place under the tongue.    . gabapentin (NEURONTIN) 100 MG capsule Take one tablet at bedtime.  May take two other doses during the day as needed. 90 capsule 2  . IRON PO Take by mouth daily.    Marland Kitchen levothyroxine (SYNTHROID) 125 MCG tablet TAKE 1 TABLET BY MOUTH IN THE MORNING 30 MINUTES PRIOR TO ANY OTHER MEDICATION AND WITH WATER 90 tablet 0  . omeprazole (PRILOSEC) 40 MG capsule Take 1 capsule (40 mg total) by mouth daily. 30 capsule 3  . OVER THE COUNTER MEDICATION daily. Potassium- pt unsure of dose    . triamcinolone (NASACORT) 55 MCG/ACT AERO nasal inhaler Place 2 sprays into the nose at bedtime. 1 Inhaler 3   No current facility-administered medications on file prior to visit.   Allergies:  Allergies  Allergen Reactions  . Atorvastatin     Fatigue    Medical History:  She has Abnormal glucose; Hyperlipidemia; Vitamin D deficiency; Hypothyroidism; Depression, major, in remission (HCC); GERD (gastroesophageal reflux disease); Medication management; Obesity (BMI 30.0-34.9); Insomnia; and Fatty liver on their problem list. Health Maintenance:   Immunization History  Administered Date(s) Administered  . Influenza Split 02/08/2014  . Influenza, Seasonal, Injecte, Preservative Fre 02/21/2015  . PFIZER(Purple Top)SARS-COV-2 Vaccination 08/09/2019  . Td 03/12/2002  . Tdap 02/08/2014   Tetanus: 2015  Flu vaccine: 2019 declined this year- husband lost job Shingrix: declines Covid 19: ***  LMP: No LMP recorded. (Menstrual status: Perimenopausal). Pap: 08/2017, neg HPV, due 2024 MGM: 12/2018- ***  Colonoscopy: 2019 can't find report - patient reports she has documentation, will present/mail EGD: n/a  Last Dental Exam: 2019, goes q6 month Last Eye Exam: Remote, will  schedule  Patient Care Team: Lucky Cowboy, MD as PCP - General (Internal Medicine) Dorisann Frames, MD as Consulting Physician (Endocrinology)   Surgical History:  She has a past surgical history that includes thyroid ablation (2008). Family History:  Herfamily history includes Aneurysm in her brother and sister; Arthritis in her sister; Breast cancer in her mother; Heart attack (age of onset: 48) in her paternal grandfather; Hyperlipidemia in her father and mother; Hypertension in her mother. Mother with pacemaker.  Social History:  She reports that she has never smoked. She has never used smokeless tobacco. She reports current alcohol use. She reports that she does not use drugs.  Review of Systems: Review of Systems  Constitutional: Negative for malaise/fatigue and weight loss.  HENT: Negative for hearing loss and tinnitus.   Eyes: Negative for blurred vision and double vision.  Respiratory: Negative for cough, sputum production, shortness of breath and wheezing.   Cardiovascular: Negative for chest pain, palpitations, orthopnea, claudication, leg swelling and PND.  Gastrointestinal: Negative for abdominal pain, blood in stool, constipation, diarrhea, heartburn, melena, nausea and vomiting.  Genitourinary: Negative.   Musculoskeletal: Negative for falls, joint pain and myalgias.  Skin: Negative for rash.  Neurological: Negative  for dizziness, tingling, sensory change, weakness and headaches.  Endo/Heme/Allergies: Negative for polydipsia.  Psychiatric/Behavioral: Negative.  Negative for depression, memory loss, substance abuse and suicidal ideas. The patient is not nervous/anxious and does not have insomnia.   All other systems reviewed and are negative.   Physical Exam: Estimated body mass index is 31.15 kg/m as calculated from the following:   Height as of 07/21/19: 5\' 6"  (1.676 m).   Weight as of 02/19/20: 193 lb (87.5 kg). There were no vitals taken for this  visit. General Appearance: Well nourished, in no apparent distress.  Eyes: PERRLA, EOMs, conjunctiva no swelling or erythema Sinuses: No Frontal/maxillary tenderness  ENT/Mouth: Ext aud canals clear, normal light reflex with TMs without erythema, bulging. Good dentition. No erythema, swelling, or exudate on post pharynx. Tonsils not swollen or erythematous. Hearing normal.  Neck: Supple, thyroid normal. No bruits  Respiratory: Respiratory effort normal, BS equal bilaterally without rales, rhonchi, wheezing or stridor.  Cardio: RRR without murmurs, rubs or gallops. Brisk peripheral pulses without edema.  Chest: symmetric, with normal excursions and percussion.  Breasts: Symmetric, without lumps, nipple discharge, retractions. *** Abdomen: Soft, nontender, no guarding, rebound, hernias, masses, or organomegaly.  Lymphatics: Non tender without lymphadenopathy.  Genitourinary: Normal internal and external structures *** Musculoskeletal: Full ROM all peripheral extremities,5/5 strength, and normal gait.  Skin: Warm, dry without rashes, lesions, ecchymosis. Neuro: Cranial nerves intact, reflexes equal bilaterally. Normal muscle tone, no cerebellar symptoms. Sensation intact.  Psych: Awake and oriented X 3, normal affect, Insight and Judgment appropriate.   14/10/21 Shawntavia Saunders 8:09 AM [facility]

## 2020-07-27 ENCOUNTER — Encounter: Payer: 59 | Admitting: Adult Health

## 2020-07-27 DIAGNOSIS — K219 Gastro-esophageal reflux disease without esophagitis: Secondary | ICD-10-CM

## 2020-07-27 DIAGNOSIS — Z1389 Encounter for screening for other disorder: Secondary | ICD-10-CM

## 2020-07-27 DIAGNOSIS — Z79899 Other long term (current) drug therapy: Secondary | ICD-10-CM

## 2020-07-27 DIAGNOSIS — R7309 Other abnormal glucose: Secondary | ICD-10-CM

## 2020-07-27 DIAGNOSIS — K760 Fatty (change of) liver, not elsewhere classified: Secondary | ICD-10-CM

## 2020-07-27 DIAGNOSIS — E039 Hypothyroidism, unspecified: Secondary | ICD-10-CM

## 2020-07-27 DIAGNOSIS — E559 Vitamin D deficiency, unspecified: Secondary | ICD-10-CM

## 2020-07-27 DIAGNOSIS — G4700 Insomnia, unspecified: Secondary | ICD-10-CM

## 2020-07-27 DIAGNOSIS — E669 Obesity, unspecified: Secondary | ICD-10-CM

## 2020-07-27 DIAGNOSIS — E538 Deficiency of other specified B group vitamins: Secondary | ICD-10-CM

## 2020-07-27 DIAGNOSIS — Z0000 Encounter for general adult medical examination without abnormal findings: Secondary | ICD-10-CM

## 2020-07-27 DIAGNOSIS — E782 Mixed hyperlipidemia: Secondary | ICD-10-CM

## 2020-07-27 DIAGNOSIS — Z131 Encounter for screening for diabetes mellitus: Secondary | ICD-10-CM

## 2020-07-27 DIAGNOSIS — Z1231 Encounter for screening mammogram for malignant neoplasm of breast: Secondary | ICD-10-CM

## 2020-07-27 DIAGNOSIS — F325 Major depressive disorder, single episode, in full remission: Secondary | ICD-10-CM

## 2020-10-02 ENCOUNTER — Other Ambulatory Visit: Payer: Self-pay | Admitting: Internal Medicine

## 2020-10-02 DIAGNOSIS — E039 Hypothyroidism, unspecified: Secondary | ICD-10-CM

## 2020-10-02 MED ORDER — LEVOTHYROXINE SODIUM 125 MCG PO TABS: ORAL_TABLET | ORAL | 0 refills | Status: DC

## 2020-11-11 ENCOUNTER — Encounter: Payer: 59 | Admitting: Adult Health

## 2020-12-07 NOTE — Progress Notes (Signed)
COMPLETE PHYSICAL  Assessment and Plan:   Robin Montes was seen today for annual exam.  Diagnoses and all orders for this visit:  Encounter for general adult medical examination with abnormal findings  Due Annually  Pt wants to defer mammogram until new insurance kicks in  Mixed hyperlipidemia -     CBC with Differential/Platelet -     COMPLETE METABOLIC PANEL WITH GFR -     Lipid panel  Continue diet and exercise  Hypothyroidism, unspecified type -     TSH - Continue current dose of levothyroxine, will adjust if needed pending lab results  Gastroesophageal reflux disease, unspecified whether esophagitis present  Continue Pepto or maalox as needed and behavior modifications -     Magnesium  Abnormal glucose -     Hemoglobin A1c Counseled at length on diet and exercise  Obesity (BMI 30.0-34.9)  Counseled at length on diet and exercise  Will stop sweet sodas . Increase fresh fruits/vegetables and fiber  Vitamin D deficiency -     VITAMIN D 25 Hydroxy (Vit-D Deficiency, Fractures)  Continue Vit D supplementation  Medication management  Magnesium  Depression, major, in remission (HCC)  Continue behavior modifications, practice good sleep hygiene  Insomnia, unspecified type Practice good sleep hygiene and melatonin PRN  Screening for hematuria or proteinuria -     Urinalysis, Routine w reflex microscopic -     Microalbumin / creatinine urine ratio  Screening for ischemic heart disease -     EKG 12-Lead  Lump in neck -     US Soft Tissue Head/Neck (NON-THYROID); Future    Discussed med's effects and SE's. Screening labs and tests as requested with regular follow-up as recommended.  Future Appointments  Date Time Provider Department Center  12/11/2021  9:00 AM Revonda Humphrey, NP [facility] None     HPI  60 y.o. female. 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   BMI is Body mass index is 33.69 kg/m., she has been working on diet and exercise. Has been skipping meals.  Does yard work.  Wt Readings from Last 3 Encounters:  12/09/20 197 lb 12.8 oz (89.7 kg)  02/19/20 193 lb (87.5 kg)  07/21/19 193 lb (87.5 kg)   Today their BP is BP: 128/90 BP Readings from Last 3 Encounters:  12/09/20 128/90  02/19/20 128/86  07/21/19 120/80    She does workout. She denies chest pain, shortness of breath, dizziness.   Patient has noticed   She is not on cholesterol medication and denies myalgias. Her cholesterol is not at goal. The cholesterol last visit was:   Lab Results  Component Value Date   CHOL 205 (H) 07/21/2019   HDL 50 07/21/2019   LDLCALC 130 (H) 07/21/2019   TRIG 142 07/21/2019   CHOLHDL 4.1 07/21/2019   She has been working on diet and exercise for prediabetes, she is not on bASA, she is not on ACE/ARB (BP controlled) and denies foot ulcerations, increased appetite, nausea, paresthesia of the feet, polydipsia, polyuria, visual disturbances, vomiting and weight loss. Last A1C in the office was:  Lab Results  Component Value Date   HGBA1C 5.7 (H) 07/21/2019   She is on thyroid medication qd taking regularly Lab Results  Component Value  Date   TSH 3.83 02/19/2020   Last GFR: Lab Results  Component Value Date   GFRNONAA 97 02/19/2020   Patient is on Vitamin D supplement, she is on 5000 IU every day.    Lab Results  Component Value Date   VD25OH 30 07/21/2019     Pt does have periods of low mood because family lives long distance and husband works long hours. Does have friends but long distance away.   Current Medications:  Current Outpatient Medications on File Prior to Visit   Medication Sig Dispense Refill   Calcium Carbonate (CALCIUM 600 PO) Take by mouth.     Cholecalciferol (VITAMIN D PO) Take 5,000 Int'l Units by mouth daily.     Cyanocobalamin (VITAMIN B12 SL) Place under the tongue.     IRON PO Take by mouth daily.     levothyroxine (SYNTHROID) 125 MCG tablet Take  1 tablet  Daily  on an empty stomach with only water for 30 minutes & no Antacid meds, Calcium or Magnesium for 4 hours & avoid Biotin / Patient knows to take by mouth 90 tablet 0   OVER THE COUNTER MEDICATION daily. Potassium- pt unsure of dose     gabapentin (NEURONTIN) 100 MG capsule Take one tablet at bedtime.  May take two other doses during the day as needed. 90 capsule 2   omeprazole (PRILOSEC) 40 MG capsule Take 1 capsule (40 mg total) by mouth daily. (Patient not taking: Reported on 12/09/2020) 30 capsule 3   triamcinolone (NASACORT) 55 MCG/ACT AERO nasal inhaler Place 2 sprays into the nose at bedtime. 1 Inhaler 3   No current facility-administered medications on file prior to visit.   Allergies:  Allergies  Allergen Reactions   Atorvastatin     Fatigue    Medical History:  She has Abnormal glucose (prediabetes); Hyperlipidemia; Vitamin D deficiency; Hypothyroidism; Depression, major, in remission (HCC); GERD (gastroesophageal reflux disease); Medication management; Obesity (BMI 30.0-34.9); Insomnia; Fatty liver; and B12 deficiency on their problem list. Health Maintenance:   Immunization History  Administered Date(s) Administered   Influenza Split 02/08/2014   Influenza, Seasonal, Injecte, Preservative Fre 02/21/2015   PFIZER(Purple Top)SARS-COV-2 Vaccination 08/09/2019   Td 03/12/2002   Tdap 02/08/2014   Tetanus: 2015  Flu vaccine: 2019 declined this year- husband lost job Shingrix: declines  LMP: No LMP recorded. (Menstrual status: Perimenopausal). Pap: 08/2017 normal due 2024 MGM: 12/2018- due    Colonoscopy: 2019 can't find report - patient reports she has  documentation, will present/mail EGD: n/a  Last Dental Exam: 2019, goes q6 month Last Eye Exam: Remote, will schedule  Patient Care Team: Lucky Cowboy, MD as PCP - General (Internal Medicine) Dorisann Frames, MD as Consulting Physician (Endocrinology)  Surgical History:  She has a past surgical history that includes thyroid ablation (2008). Family History:  Herfamily history includes Aneurysm in her brother and sister; Arthritis in her sister; Breast cancer in her mother; Heart attack (age of onset: 31) in her paternal grandfather; Hyperlipidemia in her father and mother; Hypertension in her mother. Mother with pacemaker.  Social History:  She reports that she has never smoked. She has never used smokeless tobacco. She reports current alcohol use. She reports that she does not use drugs.  Review of Systems: Review of Systems  Constitutional:  Negative for chills, fever, malaise/fatigue and weight loss.  HENT:  Negative for congestion, hearing loss, sinus pain, sore throat and tinnitus.   Eyes:  Negative for blurred vision and double vision.  Respiratory:  Negative for cough, hemoptysis, sputum production, shortness of breath and wheezing.   Cardiovascular:  Negative for chest pain, palpitations, orthopnea, claudication, leg swelling and PND.  Gastrointestinal:  Positive for heartburn (occ with spicy foods). Negative for abdominal pain, blood in stool, constipation, diarrhea, melena, nausea and vomiting.  Genitourinary: Negative.  Negative for dysuria and urgency.  Musculoskeletal:  Negative for back pain, falls, joint pain, myalgias and neck pain.  Skin:  Negative for rash.  Neurological:  Negative for dizziness, tingling, tremors, sensory change, weakness and headaches.  Endo/Heme/Allergies:  Negative for polydipsia. Does not bruise/bleed easily.  Psychiatric/Behavioral:  Positive for depression (feels low mood occasionally). Negative for memory loss, substance abuse and suicidal  ideas. The patient has insomnia (uses melatonin as needed, wakes frequently). The patient is not nervous/anxious.   All other systems reviewed and are negative.  Physical Exam: Estimated body mass index is 33.69 kg/m as calculated from the following:   Height as of this encounter: 5' 4.25" (1.632 m).   Weight as of this encounter: 197 lb 12.8 oz (89.7 kg). BP 128/90   Pulse 78   Temp (!) 97.5 F (36.4 C)   Ht 5' 4.25" (1.632 m)   Wt 197 lb 12.8 oz (89.7 kg)   SpO2 99%   BMI 33.69 kg/m  General Appearance: Well nourished, in no apparent distress.  Eyes: PERRLA, EOMs, conjunctiva no swelling or erythema, normal fundi and vessels.  Sinuses: No Frontal/maxillary tenderness  ENT/Mouth: Ext aud canals clear, normal light reflex with TMs without erythema, bulging. Good dentition. No erythema, swelling, or exudate on post pharynx. Tonsils not swollen or erythematous. Hearing normal.  Neck: 1-2 cm soft moveable non tender area under chin Respiratory: Respiratory effort normal, BS equal bilaterally without rales, rhonchi, wheezing or stridor.  Cardio: RRR without murmurs, rubs or gallops. Brisk peripheral pulses without edema.  Chest: symmetric, with normal excursions and percussion.  Breasts:Defer.  Abdomen: Soft, nontender, no guarding, rebound, hernias, masses, or organomegaly.  Lymphatics: Non tender without lymphadenopathy.  Genitourinary:Defer Musculoskeletal: Full ROM all peripheral extremities,5/5 strength, and normal gait.  Skin: Warm, dry without rashes, lesions, ecchymosis. Neuro: Cranial nerves intact, reflexes equal bilaterally. Normal muscle tone, no cerebellar symptoms. Sensation intact.  Psych: Awake and oriented X 3, normal affect, Insight and Judgment appropriate.  EKG: No ST changes  Eesha Schmaltz W Marycatherine Maniscalco 9:05 AM [facility]

## 2020-12-09 ENCOUNTER — Encounter: Payer: Self-pay | Admitting: Nurse Practitioner

## 2020-12-09 ENCOUNTER — Ambulatory Visit (INDEPENDENT_AMBULATORY_CARE_PROVIDER_SITE_OTHER): Payer: 59 | Admitting: Nurse Practitioner

## 2020-12-09 ENCOUNTER — Other Ambulatory Visit: Payer: Self-pay

## 2020-12-09 VITALS — BP 128/90 | HR 78 | Temp 97.5°F | Ht 64.25 in | Wt 197.8 lb

## 2020-12-09 DIAGNOSIS — Z1389 Encounter for screening for other disorder: Secondary | ICD-10-CM

## 2020-12-09 DIAGNOSIS — F325 Major depressive disorder, single episode, in full remission: Secondary | ICD-10-CM

## 2020-12-09 DIAGNOSIS — Z79899 Other long term (current) drug therapy: Secondary | ICD-10-CM | POA: Diagnosis not present

## 2020-12-09 DIAGNOSIS — R7309 Other abnormal glucose: Secondary | ICD-10-CM

## 2020-12-09 DIAGNOSIS — E559 Vitamin D deficiency, unspecified: Secondary | ICD-10-CM | POA: Diagnosis not present

## 2020-12-09 DIAGNOSIS — E782 Mixed hyperlipidemia: Secondary | ICD-10-CM

## 2020-12-09 DIAGNOSIS — G4700 Insomnia, unspecified: Secondary | ICD-10-CM

## 2020-12-09 DIAGNOSIS — K219 Gastro-esophageal reflux disease without esophagitis: Secondary | ICD-10-CM

## 2020-12-09 DIAGNOSIS — Z0000 Encounter for general adult medical examination without abnormal findings: Secondary | ICD-10-CM | POA: Diagnosis not present

## 2020-12-09 DIAGNOSIS — I10 Essential (primary) hypertension: Secondary | ICD-10-CM

## 2020-12-09 DIAGNOSIS — Z136 Encounter for screening for cardiovascular disorders: Secondary | ICD-10-CM | POA: Diagnosis not present

## 2020-12-09 DIAGNOSIS — R221 Localized swelling, mass and lump, neck: Secondary | ICD-10-CM

## 2020-12-09 DIAGNOSIS — N3090 Cystitis, unspecified without hematuria: Secondary | ICD-10-CM

## 2020-12-09 DIAGNOSIS — Z131 Encounter for screening for diabetes mellitus: Secondary | ICD-10-CM

## 2020-12-09 DIAGNOSIS — Z13220 Encounter for screening for lipoid disorders: Secondary | ICD-10-CM

## 2020-12-09 DIAGNOSIS — E039 Hypothyroidism, unspecified: Secondary | ICD-10-CM

## 2020-12-09 DIAGNOSIS — E669 Obesity, unspecified: Secondary | ICD-10-CM

## 2020-12-09 DIAGNOSIS — Z0001 Encounter for general adult medical examination with abnormal findings: Secondary | ICD-10-CM

## 2020-12-09 NOTE — Patient Instructions (Addendum)
YOU CAN CALL TO MAKE AN ULTRASOUND.. ° °I have put in an order for an ultrasound for you to have °You can set them up at your convenience by calling this number [phone] °You will likely have the ultrasound at [address] ° °If you have any issues call our office and we will set this up for you.   ° °GENERAL HEALTH GOALS °  °Know what a healthy weight is for you (roughly BMI <25) and aim to maintain this °  °Aim for 7+ servings of fruits and vegetables daily °  °70-80+ fluid ounces of water or unsweet tea for healthy kidneys °  °Limit to max 1 drink of alcohol per day; avoid smoking/tobacco °  °Limit animal fats in diet for cholesterol and heart health - choose grass fed whenever available °  °Avoid highly processed foods, and foods high in saturated/trans fats °  °Aim for low stress - take time to unwind and care for your mental health °  °Aim for 150 min of moderate intensity exercise weekly for heart health, and weights twice weekly for bone health °  °Aim for 7-9 hours of sleep daily  °

## 2020-12-10 LAB — MICROALBUMIN / CREATININE URINE RATIO
Creatinine, Urine: 165 mg/dL (ref 20–275)
Microalb Creat Ratio: 4 mcg/mg creat (ref <30)
Microalb, Ur: 0.6 mg/dL

## 2020-12-10 LAB — LIPID PANEL
Cholesterol: 215 mg/dL — ABNORMAL HIGH (ref <200)
HDL: 48 mg/dL — ABNORMAL LOW (ref >=50)
LDL Cholesterol (Calc): 143 mg/dL (calc) — ABNORMAL HIGH
Non-HDL Cholesterol (Calc): 167 mg/dL (calc) — ABNORMAL HIGH (ref <130)
Total CHOL/HDL Ratio: 4.5 (calc) (ref <5.0)
Triglycerides: 122 mg/dL (ref <150)

## 2020-12-10 LAB — CBC WITH DIFFERENTIAL/PLATELET
Absolute Monocytes: 306 cells/uL (ref 200–950)
Basophils Absolute: 30 cells/uL (ref 0–200)
Basophils Relative: 0.5 %
Eosinophils Absolute: 60 cells/uL (ref 15–500)
Eosinophils Relative: 1 %
HCT: 40.6 % (ref 35.0–45.0)
Hemoglobin: 13.7 g/dL (ref 11.7–15.5)
Lymphs Abs: 2178 cells/uL (ref 850–3900)
MCH: 31.1 pg (ref 27.0–33.0)
MCHC: 33.7 g/dL (ref 32.0–36.0)
MCV: 92.1 fL (ref 80.0–100.0)
MPV: 11.8 fL (ref 7.5–12.5)
Monocytes Relative: 5.1 %
Neutro Abs: 3426 cells/uL (ref 1500–7800)
Neutrophils Relative %: 57.1 %
Platelets: 209 10*3/uL (ref 140–400)
RBC: 4.41 10*6/uL (ref 3.80–5.10)
RDW: 12.1 % (ref 11.0–15.0)
Total Lymphocyte: 36.3 %
WBC: 6 10*3/uL (ref 3.8–10.8)

## 2020-12-10 LAB — URINALYSIS, ROUTINE W REFLEX MICROSCOPIC
Bilirubin Urine: NEGATIVE
Glucose, UA: NEGATIVE
Hgb urine dipstick: NEGATIVE
Hyaline Cast: NONE SEEN /LPF
Ketones, ur: NEGATIVE
Nitrite: NEGATIVE
Protein, ur: NEGATIVE
RBC / HPF: NONE SEEN /HPF (ref 0–2)
Specific Gravity, Urine: 1.019 (ref 1.001–1.035)
pH: 5.5 (ref 5.0–8.0)

## 2020-12-10 LAB — COMPLETE METABOLIC PANEL WITH GFR
AG Ratio: 1.4 (calc) (ref 1.0–2.5)
ALT: 41 U/L — ABNORMAL HIGH (ref 6–29)
AST: 28 U/L (ref 10–35)
Albumin: 4.6 g/dL (ref 3.6–5.1)
Alkaline phosphatase (APISO): 75 U/L (ref 37–153)
BUN: 12 mg/dL (ref 7–25)
CO2: 26 mmol/L (ref 20–32)
Calcium: 10.1 mg/dL (ref 8.6–10.4)
Chloride: 105 mmol/L (ref 98–110)
Creat: 0.79 mg/dL (ref 0.50–1.03)
Globulin: 3.2 g/dL (calc) (ref 1.9–3.7)
Glucose, Bld: 99 mg/dL (ref 65–99)
Potassium: 4.3 mmol/L (ref 3.5–5.3)
Sodium: 141 mmol/L (ref 135–146)
Total Bilirubin: 0.6 mg/dL (ref 0.2–1.2)
Total Protein: 7.8 g/dL (ref 6.1–8.1)
eGFR: 87 mL/min/{1.73_m2} (ref >=60)

## 2020-12-10 LAB — HEMOGLOBIN A1C
Hgb A1c MFr Bld: 5.8 % of total Hgb — ABNORMAL HIGH (ref <5.7)
Mean Plasma Glucose: 120 mg/dL
eAG (mmol/L): 6.6 mmol/L

## 2020-12-10 LAB — MAGNESIUM: Magnesium: 2 mg/dL (ref 1.5–2.5)

## 2020-12-10 LAB — MICROSCOPIC MESSAGE

## 2020-12-10 LAB — TSH: TSH: 5.77 mIU/L — ABNORMAL HIGH (ref 0.40–4.50)

## 2020-12-10 LAB — VITAMIN D 25 HYDROXY (VIT D DEFICIENCY, FRACTURES): Vit D, 25-Hydroxy: 36 ng/mL (ref 30–100)

## 2020-12-12 MED ORDER — ROSUVASTATIN CALCIUM 5 MG PO TABS: 5.0000 mg | ORAL_TABLET | Freq: Every day | ORAL | 3 refills | Status: DC

## 2020-12-12 MED ORDER — NITROFURANTOIN MONOHYD MACRO 100 MG PO CAPS: 100.0000 mg | ORAL_CAPSULE | Freq: Two times a day (BID) | ORAL | 0 refills | Status: AC

## 2020-12-12 NOTE — Addendum Note (Signed)
Addended by: Revonda Humphrey on: 12/12/2020 02:01 PM   Modules accepted: Orders

## 2020-12-12 NOTE — Addendum Note (Signed)
Addended by: Revonda Humphrey on: 12/12/2020 01:59 PM   Modules accepted: Orders

## 2021-01-09 ENCOUNTER — Other Ambulatory Visit: Payer: Self-pay | Admitting: Internal Medicine

## 2021-01-09 DIAGNOSIS — Z1231 Encounter for screening mammogram for malignant neoplasm of breast: Secondary | ICD-10-CM

## 2021-01-10 ENCOUNTER — Other Ambulatory Visit: Payer: Self-pay

## 2021-01-10 DIAGNOSIS — E039 Hypothyroidism, unspecified: Secondary | ICD-10-CM

## 2021-01-10 MED ORDER — LEVOTHYROXINE SODIUM 125 MCG PO TABS
ORAL_TABLET | ORAL | 0 refills | Status: DC
Start: 2021-01-10 — End: 2021-04-13

## 2021-01-31 ENCOUNTER — Telehealth: Payer: Self-pay

## 2021-01-31 NOTE — Telephone Encounter (Signed)
Husband called and said Robin Montes is still having pains at night all over her body. She describes it as electric shock and she can't sleep. He is very worried about her.

## 2021-01-31 NOTE — Telephone Encounter (Signed)
She does not admit to pain when I ask her. I would recommend she make an appointment after Thanksgiving for further evaluation

## 2021-02-14 NOTE — Progress Notes (Signed)
Assessment and Plan:  Robin Montes was seen today for acute visit.  Diagnoses and all orders for this visit:  Acute low back pain with bilateral sciatica, unspecified back pain laterality -     DG Lumbar Spine Complete; Future -     predniSONE (DELTASONE) 20 MG tablet; 3 tablets daily with food for 3 days, 2 tabs daily for 3 days, 1 tab a day for 5 days. If symptoms are not improved with use of steroids will refer to neurology for further evaluation  Mixed hyperlipidemia -     Lipid panel Continue diet and exercise Pt would like to stop taking Crestor, will recheck lipids today      Further disposition pending results of labs. Discussed med's effects and SE's.   Over 30 minutes of exam, counseling, chart review, and critical decision making was performed.   Future Appointments  Date Time Provider Department Center  12/11/2021  9:00 AM Revonda Humphrey, NP [facility] None    ------------------------------------------------------------------------------------------------------------------   HPI BP 132/70   Pulse 95   Temp 97.7 F (36.5 C)   Wt 198 lb 3.2 oz (89.9 kg)   SpO2 97%   BMI 33.76 kg/m  60 y.o.female presents for pain in lower back that is worse when sitting or laying flat that she describes as an electrical sensation that lingers. She states that is the worst when she tries to go to sleep at night. Only time it resolves is when she is standing.  This has been going on for several months. When she lays on her stomach she feels a vibration electrical sensation in her lower abdomen.   She was started on Rosuvastatin 5 mg daily for cholesterol. Husband is questioning if she does need to take this medication as she never had elevated cholesterol previously but had been eating poorly. Has been trying to eat less saturated fats Lab Results  Component Value Date   CHOL 215 (H) 12/09/2020   HDL 48 (L) 12/09/2020   LDLCALC 143 (H) 12/09/2020   TRIG 122 12/09/2020   CHOLHDL 4.5  12/09/2020    BMI is Body mass index is 33.76 kg/m., she has not been working on diet and exercise. Wt Readings from Last 3 Encounters:  02/17/21 198 lb 3.2 oz (89.9 kg)  12/09/20 197 lb 12.8 oz (89.7 kg)  02/19/20 193 lb (87.5 kg)     Past Medical History:  Diagnosis Date   Cholelithiasis    Depression    Fibrocystic breast disease    GERD (gastroesophageal reflux disease)    Hyperlipidemia    Hypothyroidism    Labile hypertension    Migraine    Prediabetes      Allergies  Allergen Reactions   Atorvastatin     Fatigue     Current Outpatient Medications on File Prior to Visit  Medication Sig   Calcium Carbonate (CALCIUM 600 PO) Take by mouth.   Cholecalciferol (VITAMIN D PO) Take 5,000 Int'l Units by mouth daily.   Cyanocobalamin (VITAMIN B12 SL) Place under the tongue.   IRON PO Take by mouth daily.   levothyroxine (SYNTHROID) 125 MCG tablet Take  1 tablet  Daily  on an empty stomach with only water for 30 minutes & no Antacid meds, Calcium or Magnesium for 4 hours & avoid Biotin / Patient knows to take by mouth   OVER THE COUNTER MEDICATION daily. Potassium- pt unsure of dose   rosuvastatin (CRESTOR) 5 MG tablet Take 1 tablet (5 mg total) by mouth  daily.   triamcinolone (NASACORT) 55 MCG/ACT AERO nasal inhaler Place 2 sprays into the nose at bedtime.   No current facility-administered medications on file prior to visit.    ROS: all negative except above.   Physical Exam:  BP 132/70   Pulse 95   Temp 97.7 F (36.5 C)   Wt 198 lb 3.2 oz (89.9 kg)   SpO2 97%   BMI 33.76 kg/m   General Appearance: Well nourished, in no apparent distress. Eyes: PERRLA, EOMs, conjunctiva no swelling or erythema Sinuses: No Frontal/maxillary tenderness ENT/Mouth: Ext aud canals clear, TMs without erythema, bulging. No erythema, swelling, or exudate on post pharynx.  Tonsils not swollen or erythematous. Hearing normal.  Neck: Supple, thyroid normal.  Respiratory: Respiratory  effort normal, BS equal bilaterally without rales, rhonchi, wheezing or stridor.  Cardio: RRR with no MRGs. Brisk peripheral pulses without edema.  Abdomen: Soft, + BS.  Non tender, no guarding, rebound, hernias, masses. Lymphatics: Non tender without lymphadenopathy.  Musculoskeletal: Full ROM, 5/5 strength, normal gait.  Skin: Warm, dry without rashes, lesions, ecchymosis.  Neuro: Cranial nerves intact. Normal muscle tone, no cerebellar symptoms. Sensation intact.  Psych: Awake and oriented X 3, normal affect, Insight and Judgment appropriate.     Revonda Humphrey, NP 11:53 AM [facility]

## 2021-02-17 ENCOUNTER — Ambulatory Visit
Admission: RE | Admit: 2021-02-17 | Discharge: 2021-02-17 | Disposition: A | Discharge status: Home / Self Care | Payer: 59 | Source: Ambulatory Visit | Attending: Nurse Practitioner | Admitting: Nurse Practitioner

## 2021-02-17 ENCOUNTER — Ambulatory Visit
Admission: RE | Admit: 2021-02-17 | Discharge: 2021-02-17 | Disposition: A | Discharge status: Home / Self Care | Payer: 59 | Source: Ambulatory Visit | Attending: Internal Medicine | Admitting: Internal Medicine

## 2021-02-17 ENCOUNTER — Encounter: Payer: Self-pay | Admitting: Nurse Practitioner

## 2021-02-17 ENCOUNTER — Ambulatory Visit: Payer: 59 | Admitting: Nurse Practitioner

## 2021-02-17 ENCOUNTER — Other Ambulatory Visit: Payer: Self-pay

## 2021-02-17 VITALS — BP 132/70 | HR 95 | Temp 97.7°F | Wt 198.2 lb

## 2021-02-17 DIAGNOSIS — E782 Mixed hyperlipidemia: Secondary | ICD-10-CM

## 2021-02-17 DIAGNOSIS — Z1231 Encounter for screening mammogram for malignant neoplasm of breast: Secondary | ICD-10-CM

## 2021-02-17 DIAGNOSIS — M5442 Lumbago with sciatica, left side: Secondary | ICD-10-CM

## 2021-02-17 DIAGNOSIS — M5441 Lumbago with sciatica, right side: Secondary | ICD-10-CM

## 2021-02-17 MED ORDER — PREDNISONE 20 MG PO TABS: ORAL_TABLET | ORAL | 0 refills | Status: AC

## 2021-02-17 MED ORDER — ROSUVASTATIN CALCIUM 5 MG PO TABS: 5.0000 mg | ORAL_TABLET | Freq: Every day | ORAL | 3 refills | Status: DC

## 2021-02-17 NOTE — Patient Instructions (Signed)
Sciatica Sciatica is pain, numbness, weakness, or tingling along the path of the sciatic nerve. The sciatic nerve starts in the lower back and runs down the back of each leg. The nerve controls the muscles in the lower leg and in the back of the knee. It also provides feeling (sensation) to the back of the thigh, the lower leg, and the sole of the foot. Sciatica is a symptom of another medical condition that pinches or puts pressure on the sciatic nerve. Sciatica most often only affects one side of the body. Sciatica usually goes away on its own or with treatment. In some cases, sciatica may come back (recur). What are the causes? This condition is caused by pressure on the sciatic nerve or pinching of the nerve. This may be the result of: A disk in between the bones of the spine bulging out too far (herniated disk). Age-related changes in the spinal disks. A pain disorder that affects a muscle in the buttock. Extra bone growth near the sciatic nerve. A break (fracture) of the pelvis. Pregnancy. Tumor. This is rare. What increases the risk? The following factors may make you more likely to develop this condition: Playing sports that place pressure or stress on the spine. Having poor strength and flexibility. A history of back injury or surgery. Sitting for long periods of time. Doing activities that involve repetitive bending or lifting. Obesity. What are the signs or symptoms? Symptoms can vary from mild to very severe, and they may include: Any of these problems in the lower back, leg, hip, or buttock: Mild tingling, numbness, or dull aches. Burning sensations. Sharp pains. Numbness in the back of the calf or the sole of the foot. Leg weakness. Severe back pain that makes movement difficult. Symptoms may get worse when you cough, sneeze, or laugh, or when you sit or stand for long periods of time. How is this diagnosed? This condition may be diagnosed based on: Your symptoms and  medical history. A physical exam. Blood tests. Imaging tests, such as: X-rays. MRI. CT scan. How is this treated? In many cases, this condition improves on its own without treatment. However, treatment may include: Reducing or modifying physical activity. Exercising and stretching. Icing and applying heat to the affected area. Medicines that help to: Relieve pain and swelling. Relax your muscles. Injections of medicines that help to relieve pain, irritation, and inflammation around the sciatic nerve (steroids). Surgery. Follow these instructions at home: Medicines Take over-the-counter and prescription medicines only as told by your health care provider. Ask your health care provider if the medicine prescribed to you: Requires you to avoid driving or using heavy machinery. Can cause constipation. You may need to take these actions to prevent or treat constipation: Drink enough fluid to keep your urine pale yellow. Take over-the-counter or prescription medicines. Eat foods that are high in fiber, such as beans, whole grains, and fresh fruits and vegetables. Limit foods that are high in fat and processed sugars, such as fried or sweet foods. Managing pain   If directed, put ice on the affected area. Put ice in a plastic bag. Place a towel between your skin and the bag. Leave the ice on for 20 minutes, 2-3 times a day. If directed, apply heat to the affected area. Use the heat source that your health care provider recommends, such as a moist heat pack or a heating pad. Place a towel between your skin and the heat source. Leave the heat on for 20-30 minutes. Remove   the heat if your skin turns bright red. This is especially important if you are unable to feel pain, heat, or cold. You may have a greater risk of getting burned. Activity  Return to your normal activities as told by your health care provider. Ask your health care provider what activities are safe for you. Avoid  activities that make your symptoms worse. Take brief periods of rest throughout the day. When you rest for longer periods, mix in some mild activity or stretching between periods of rest. This will help to prevent stiffness and pain. Avoid sitting for long periods of time without moving. Get up and move around at least one time each hour. Exercise and stretch regularly, as told by your health care provider. Do not lift anything that is heavier than 10 lb (4.5 kg) while you have symptoms of sciatica. When you do not have symptoms, you should still avoid heavy lifting, especially repetitive heavy lifting. When you lift objects, always use proper lifting technique, which includes: Bending your knees. Keeping the load close to your body. Avoiding twisting. General instructions Maintain a healthy weight. Excess weight puts extra stress on your back. Wear supportive, comfortable shoes. Avoid wearing high heels. Avoid sleeping on a mattress that is too soft or too hard. A mattress that is firm enough to support your back when you sleep may help to reduce your pain. Keep all follow-up visits as told by your health care provider. This is important. Contact a health care provider if: You have pain that: Wakes you up when you are sleeping. Gets worse when you lie down. Is worse than you have experienced in the past. Lasts longer than 4 weeks. You have an unexplained weight loss. Get help right away if: You are not able to control when you urinate or have bowel movements (incontinence). You have: Weakness in your lower back, pelvis, buttocks, or legs that gets worse. Redness or swelling of your back. A burning sensation when you urinate. Summary Sciatica is pain, numbness, weakness, or tingling along the path of the sciatic nerve. This condition is caused by pressure on the sciatic nerve or pinching of the nerve. Sciatica can cause pain, numbness, or tingling in the lower back, legs, hips, and  buttocks. Treatment often includes rest, exercise, medicines, and applying ice or heat. This information is not intended to replace advice given to you by your health care provider. Make sure you discuss any questions you have with your health care provider. Document Revised: 03/17/2018 Document Reviewed: 03/17/2018 [employer] Patient Education  2022 [employer]  

## 2021-02-18 LAB — LIPID PANEL
Cholesterol: 199 mg/dL (ref <200)
HDL: 42 mg/dL — ABNORMAL LOW (ref >=50)
LDL Cholesterol (Calc): 121 mg/dL (calc) — ABNORMAL HIGH
Non-HDL Cholesterol (Calc): 157 mg/dL (calc) — ABNORMAL HIGH (ref <130)
Total CHOL/HDL Ratio: 4.7 (calc) (ref <5.0)
Triglycerides: 238 mg/dL — ABNORMAL HIGH (ref <150)

## 2021-03-02 ENCOUNTER — Other Ambulatory Visit: Payer: Self-pay | Admitting: Nurse Practitioner

## 2021-03-02 DIAGNOSIS — R52 Pain, unspecified: Secondary | ICD-10-CM

## 2021-04-13 ENCOUNTER — Other Ambulatory Visit: Payer: Self-pay

## 2021-04-13 DIAGNOSIS — E039 Hypothyroidism, unspecified: Secondary | ICD-10-CM

## 2021-04-13 MED ORDER — LEVOTHYROXINE SODIUM 125 MCG PO TABS: ORAL_TABLET | ORAL | 0 refills | Status: DC

## 2021-05-05 ENCOUNTER — Other Ambulatory Visit: Payer: Self-pay | Admitting: Nurse Practitioner

## 2021-05-05 DIAGNOSIS — R221 Localized swelling, mass and lump, neck: Secondary | ICD-10-CM

## 2021-06-02 ENCOUNTER — Ambulatory Visit
Admission: RE | Admit: 2021-06-02 | Discharge: 2021-06-02 | Disposition: A | Discharge status: Home / Self Care | Payer: 59 | Source: Ambulatory Visit | Attending: Nurse Practitioner | Admitting: Nurse Practitioner

## 2021-06-02 DIAGNOSIS — R221 Localized swelling, mass and lump, neck: Secondary | ICD-10-CM

## 2021-06-09 ENCOUNTER — Ambulatory Visit: Payer: 59 | Admitting: Diagnostic Neuroimaging

## 2021-06-09 ENCOUNTER — Encounter: Payer: Self-pay | Admitting: Diagnostic Neuroimaging

## 2021-06-09 ENCOUNTER — Telehealth: Payer: Self-pay | Admitting: Diagnostic Neuroimaging

## 2021-06-09 VITALS — BP 132/78 | HR 96 | Ht 64.0 in | Wt 203.2 lb

## 2021-06-09 DIAGNOSIS — R200 Anesthesia of skin: Secondary | ICD-10-CM | POA: Diagnosis not present

## 2021-06-09 DIAGNOSIS — M5441 Lumbago with sciatica, right side: Secondary | ICD-10-CM | POA: Diagnosis not present

## 2021-06-09 DIAGNOSIS — G8929 Other chronic pain: Secondary | ICD-10-CM | POA: Diagnosis not present

## 2021-06-09 DIAGNOSIS — M5442 Lumbago with sciatica, left side: Secondary | ICD-10-CM | POA: Diagnosis not present

## 2021-06-09 NOTE — Progress Notes (Signed)
? ?[facility] ? ?PATIENT: Robin Montes ?DOB: [birth year] ? ?REFERRING CLINICIAN: Magda Bernheim, NP ?HISTORY FROM: patient  ?REASON FOR VISIT: new consult  ? ? ?HISTORICAL ? ?CHIEF COMPLAINT:  ?Chief Complaint  ?Patient presents with  ? New Patient (Initial Visit)  ?  RM 7 with husband, Riad. Internal referral for low back pain/sciatica. Has electrical shocks. Sometimes shakes from sx. Sx started around 8 months ago. No injuries prior to sx starting.   ? ? ?HISTORY OF PRESENT ILLNESS:  ? ?61 year old female here for evaluation of abnormal sensation in the lower extremities, pelvis, abdomen, groin region.  Symptoms started 1 to 2 years ago, typically affecting her when she is sitting down or laying down.  Symptoms seem to be little better if she stands up and moves around.  She feels discomfort and electrical sensations.  Also has symptoms radiating to her thighs and calves.  She has failed conservative management including stretching, exercises, over-the-counter medications and prednisone. ? ? ?REVIEW OF SYSTEMS: Full 14 system review of systems performed and negative with exception of: as per HPI. ? ?ALLERGIES: ?Allergies  ?Allergen Reactions  ? Atorvastatin   ?  Fatigue   ? ? ?HOME MEDICATIONS: ?Outpatient Medications Prior to Visit  ?Medication Sig Dispense Refill  ? Calcium Carbonate (CALCIUM 600 PO) Take by mouth.    ? Cholecalciferol (VITAMIN D PO) Take 5,000 Int'l Units by mouth daily.    ? IRON PO Take by mouth daily.    ? levothyroxine (SYNTHROID) 125 MCG tablet Take  1 tablet  Daily  on an empty stomach with only water for 30 minutes & no Antacid meds, Calcium or Magnesium for 4 hours & avoid Biotin. 90 tablet 0  ? MAGNESIUM PO Take by mouth.    ? OVER THE COUNTER MEDICATION daily. Potassium- pt unsure of dose    ? rosuvastatin (CRESTOR) 5 MG tablet Take 1 tablet (5 mg total) by mouth daily. 30 tablet 3  ? Cyanocobalamin (VITAMIN B12 SL) Place under the tongue.    ? triamcinolone  (NASACORT) 55 MCG/ACT AERO nasal inhaler Place 2 sprays into the nose at bedtime. 1 Inhaler 3  ? ?No facility-administered medications prior to visit.  ? ? ?PAST MEDICAL HISTORY: ?Past Medical History:  ?Diagnosis Date  ? Cholelithiasis   ? Depression   ? Fibrocystic breast disease   ? GERD (gastroesophageal reflux disease)   ? Hyperlipidemia   ? Hypothyroidism   ? Labile hypertension   ? Migraine   ? Prediabetes   ? ? ?PAST SURGICAL HISTORY: ?Past Surgical History:  ?Procedure Laterality Date  ? thyroid ablation  2008  ? ? ?FAMILY HISTORY: ?Family History  ?Problem Relation Age of Onset  ? Hypertension Mother   ? Breast cancer Mother   ? Hyperlipidemia Mother   ? Hyperlipidemia Father   ? Aneurysm Sister   ?     brain  ? Heart attack Paternal Grandfather 11  ? Arthritis Sister   ? Aneurysm Brother   ? ? ?SOCIAL HISTORY: ?Social History  ? ?Socioeconomic History  ? Marital status: Married  ?  Spouse name: Not on file  ? Number of children: 0  ? Years of education: 33  ? Highest education level: Not on file  ?Occupational History  ? Occupation: housewife  ?Tobacco Use  ? Smoking status: Never  ? Smokeless tobacco: Never  ?Vaping Use  ? Vaping Use: Never used  ?Substance and Sexual Activity  ? Alcohol use: Yes  ?  Comment: rarely  ? Drug use: No  ? Sexual activity: Yes  ?  Partners: Male  ?Other Topics Concern  ? Not on file  ?Social History Narrative  ? Coffee every morning (1/2 cup)  ? Right handed   ? ?Social Determinants of Health  ? ?Financial Resource Strain: Not on file  ?Food Insecurity: Not on file  ?Transportation Needs: Not on file  ?Physical Activity: Not on file  ?Stress: Not on file  ?Social Connections: Not on file  ?Intimate Partner Violence: Not on file  ? ? ? ?PHYSICAL EXAM ? ?GENERAL EXAM/CONSTITUTIONAL: ?Vitals:  ?Vitals:  ? 06/09/21 0832  ?BP: 132/78  ?Pulse: 96  ?Weight: 203 lb 3.2 oz (92.2 kg)  ?Height: 5\' 4"  (1.626 m)  ? ?Body mass index is 34.88 kg/m?. ?Wt Readings from Last 3 Encounters:   ?06/09/21 203 lb 3.2 oz (92.2 kg)  ?02/17/21 198 lb 3.2 oz (89.9 kg)  ?12/09/20 197 lb 12.8 oz (89.7 kg)  ? ?Patient is in no distress; well developed, nourished and groomed; neck is supple ? ?CARDIOVASCULAR: ?Examination of carotid arteries is normal; no carotid bruits ?Regular rate and rhythm, no murmurs ?Examination of peripheral vascular system by observation and palpation is normal ? ?EYES: ?Ophthalmoscopic exam of optic discs and posterior segments is normal; no papilledema or hemorrhages ?No results found. ? ?MUSCULOSKELETAL: ?Gait, strength, tone, movements noted in Neurologic exam below ? ?NEUROLOGIC: ?MENTAL STATUS:  ?   ? View : No data to display.  ?  ?  ?  ? ?awake, alert, oriented to person, place and time ?recent and remote memory intact ?normal attention and concentration ?language fluent, comprehension intact, naming intact ?fund of knowledge appropriate ? ?CRANIAL NERVE:  ?2nd - no papilledema on fundoscopic exam ?2nd, 3rd, 4th, 6th - pupils equal and reactive to light, visual fields full to confrontation, extraocular muscles intact, no nystagmus ?5th - facial sensation symmetric ?7th - facial strength symmetric ?8th - hearing intact ?9th - palate elevates symmetrically, uvula midline ?11th - shoulder shrug symmetric ?12th - tongue protrusion midline ? ?MOTOR:  ?normal bulk and tone, full strength in the BUE, BLE ? ?SENSORY:  ?normal and symmetric to light touch, temperature, vibration ? ?COORDINATION:  ?finger-nose-finger, fine finger movements normal ? ?REFLEXES:  ?deep tendon reflexes present and symmetric ? ?GAIT/STATION:  ?narrow based gait ? ? ? ? ?DIAGNOSTIC DATA (LABS, IMAGING, TESTING) ?- I reviewed patient records, labs, notes, testing and imaging myself where available. ? ?Lab Results  ?Component Value Date  ? WBC 6.0 12/09/2020  ? HGB 13.7 12/09/2020  ? HCT 40.6 12/09/2020  ? MCV 92.1 12/09/2020  ? PLT 209 12/09/2020  ? ?   ?Component Value Date/Time  ? NA 141 12/09/2020 1017  ? K 4.3  12/09/2020 1017  ? CL 105 12/09/2020 1017  ? CO2 26 12/09/2020 1017  ? GLUCOSE 99 12/09/2020 1017  ? BUN 12 12/09/2020 1017  ? CREATININE 0.79 12/09/2020 1017  ? CALCIUM 10.1 12/09/2020 1017  ? PROT 7.8 12/09/2020 1017  ? ALBUMIN 4.7 04/30/2016 1401  ? AST 28 12/09/2020 1017  ? ALT 41 (H) 12/09/2020 1017  ? ALKPHOS 77 04/30/2016 1401  ? BILITOT 0.6 12/09/2020 1017  ? GFRNONAA 97 02/19/2020 1127  ? GFRAA 112 02/19/2020 1127  ? ?Lab Results  ?Component Value Date  ? CHOL 199 02/17/2021  ? HDL 42 (L) 02/17/2021  ? LDLCALC 121 (H) 02/17/2021  ? TRIG 238 (H) 02/17/2021  ? CHOLHDL 4.7 02/17/2021  ? ?Lab Results  ?  Component Value Date  ? HGBA1C 5.8 (H) 12/09/2020  ? ?Lab Results  ?Component Value Date  ? [id number] 08/12/2017  ? ?Lab Results  ?Component Value Date  ? TSH 5.77 (H) 12/09/2020  ? ? ?02/17/21 xray lumbar  ?1. No acute/traumatic lumbar spine pathology. ?2. Probable gallstone. ?  ? ? ?ASSESSMENT AND PLAN ? ?61 y.o. year old female here with: ? ? ?Dx: ? ?1. Saddle anesthesia   ?2. Chronic bilateral low back pain with bilateral sciatica   ? ? ? ?PLAN: ? ?LOW BACK PAIN / SADDLE ANESTHESIA / ELECTRICAL SHOCKS (worse with laying down, sitting; since 2022) ?- check MRI thoracic / lumbar spine w/wo (rule out demyelinating disease, conus medullaris, cauda equina syndrome) ? ?Orders Placed This Encounter  ?Procedures  ? MR THORACIC SPINE W WO CONTRAST  ? MR Lumbar Spine W Wo Contrast  ? ?Return for pending if symptoms worsen or fail to improve, pending test results. ? ? ? ?Penni Bombard, MD [id number], 0000000 AM ?Certified in Neurology, Neurophysiology and Neuroimaging ? ?[facility] ?[address], [address] ?[place], [place] [address] ?[phone] ? ?

## 2021-06-09 NOTE — Telephone Encounter (Signed)
[employer] order sent to [facility], they will obtain the auth and reach out to the patient to schedule.  ?

## 2021-06-09 NOTE — Patient Instructions (Signed)
?  LOW BACK PAIN / SADDLE ANESTHESIA / ELECTRICAL SHOCKS (worse with laying down, sitting; since 2022) ?- check MRI thoracic / lumbar spine w/wo (rule out demyelinating disease, conus medullaris, cauda equina syndrome) ?

## 2021-06-21 NOTE — Progress Notes (Deleted)
FOLLOW UP 6 MONTH ? ? ?Assessment and Plan: ? ? ? ?Discussed med's effects and SE's. Screening labs and tests as requested with regular follow-up as recommended. ? ?Further disposition pending results if labs check today. Discussed med's effects and SE's.   ?Over 30 minutes of face to face interview, exam, counseling, chart review, and critical decision making was performed.  ? ?Future Appointments  ?Date Time Provider [place]  ?06/23/2021  9:30 AM Demetra Shiner, Townsend Roger, NP [facility] None  ?06/27/2021  3:00 PM [facility] MR 2 [facility]  ?06/27/2021  3:40 PM [facility]-315 MR 2 [facility]315[facility] [facility]  ?12/11/2021  9:00 AM Magda Bernheim, NP [facility] None  ? ? ? ?HPI  ?61 y.o. female. 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  ? ?Reports she has been dizzy and lost her balance and her speech was slow.  She reports this happen three days ago.  She had finished eating  And she was doing some cleaning around the house and reports every time she bend over she felt dizzy and it scared her. It last about 3-4 hours.  She did not want to bend over, if she was standing it was ok.  She reports it resolved and she was alble to lay down to sleep that night.  She reports it did not wake her from sleep.  She has not had anything like this before. ? ?She has ringing in her ears it is worse on the right vs left.  She reports it is worse when she is ;laying on that side of when she is under water. She reports sensitivity from minimal noises. ? ?She also has concerns about a short sharp feeling in her lower abdomen.  She is bracing across from hip to hip.  She reports mainly when she lays down  at night and describes this as a jolt or shock feeling.  It is quick and does not prevent her from sleep but she is concerned about it.  She is having routine bowel movements every 1-2 days.  Denies any constant pain and reports she does not feel this when she is up moving for the day. ? ?BMI is There is no height or weight on file to calculate BMI., she has been working on diet and exercise. ?Wt Readings from Last 3 Encounters:  ?06/09/21 203 lb 3.2 oz (92.2 kg)  ?02/17/21 198 lb 3.2 oz (89.9 kg)  ?12/09/20 197 lb 12.8 oz (89.7 kg)  ? ?Today their BP is   ?She does workout. She denies chest pain, shortness of breath, dizziness.  ? ? ?She is not on cholesterol medication and denies myalgias. Her cholesterol is not at goal. The cholesterol last visit was:   ?Lab Results  ?Component Value Date  ? CHOL 199 02/17/2021  ? HDL 42 (L) 02/17/2021  ? LDLCALC 121 (H) 02/17/2021  ? TRIG 238 (H) 02/17/2021  ? CHOLHDL 4.7 02/17/2021  ? ?She has been working on diet and exercise for prediabetes, she is not on bASA, she is not on ACE/ARB (BP controlled) and denies foot ulcerations, increased appetite, nausea, paresthesia of the feet, polydipsia, polyuria, visual disturbances, vomiting and weight loss. Last A1C in the office was:  ?Lab Results  ?Component Value Date  ?  HGBA1C 5.8 (H) 12/09/2020  ? ?She is on thyroid medication she is on 1 pill a day but admits that she forgets.  ?Lab Results  ?Component Value Date  ? TSH 5.77 (H) 12/09/2020  ? ?Last GFR: ?Lab Results  ?Component Value Date  ? GFRNONAA 97 02/19/2020  ? ?Patient is on Vitamin D supplement, she is on 5000 IU every day.    ?Lab Results  ?Component Value Date  ? VD25OH 36 12/09/2020  ?   ? ?Current Medications:  ?Current Outpatient Medications on File Prior to Visit  ?Medication Sig Dispense Refill  ? Calcium Carbonate (CALCIUM 600 PO) Take by mouth.    ? Cholecalciferol (VITAMIN D PO) Take 5,000 Int'l Units by mouth daily.    ? IRON PO Take by mouth daily.    ?  levothyroxine (SYNTHROID) 125 MCG tablet Take  1 tablet  Daily  on an empty stomach with only water for 30 minutes & no Antacid meds, Calcium or Magnesium for 4 hours & avoid Biotin. 90 tablet 0  ? MAGNESIUM PO Take by mouth.    ? OVER THE COUNTER MEDICATION daily. Potassium- pt unsure of dose    ? rosuvastatin (CRESTOR) 5 MG tablet Take 1 tablet (5 mg total) by mouth daily. 30 tablet 3  ? ?No current facility-administered medications on file prior to visit.  ? ?Allergies:  ?Allergies  ?Allergen Reactions  ? Atorvastatin   ?  Fatigue   ? ?Medical History:  ?She has Abnormal glucose (prediabetes); Hyperlipidemia; Vitamin D deficiency; Hypothyroidism; Depression, major, in remission ([place]); GERD (gastroesophageal reflux disease); Medication management; Obesity (BMI 30.0-34.9); Insomnia; Fatty liver; and B12 deficiency on their problem list. ?Health Maintenance:   ?Immunization History  ?Administered Date(s) Administered  ? Influenza Split 02/08/2014  ? Influenza, Seasonal, Injecte, Preservative Fre 02/21/2015  ? PFIZER(Purple Top)SARS-COV-2 Vaccination 08/09/2019  ? Td 03/12/2002  ? Tdap 02/08/2014  ? ?Tetanus: 2015  ?Flu vaccine: 2019 declined this year- husband lost job ?Shingrix: declines ? ?LMP: No LMP recorded. (Menstrual status: Perimenopausal). ?Pap: 08/2017 ?MGM: 12/2018- ? ?Colonoscopy: 2019 can't find report - patient reports she has documentation, will present/mail ?EGD: n/a ? ?Last Dental Exam: 2019, goes q6 month ?Last Eye Exam: Remote, will schedule ? ?Patient Care Team: ?Unk Pinto, MD as PCP - General (Internal Medicine) ?Jacelyn Pi, MD as Consulting Physician (Endocrinology) ? ?Surgical History:  ?She has a past surgical history that includes thyroid ablation (2008). ?Family History:  ?Herfamily history includes Aneurysm in her brother and sister; Arthritis in her sister; Breast cancer in her mother; Heart attack (age of onset: 80) in her paternal grandfather; Hyperlipidemia in her father  and mother; Hypertension in her mother. Mother with pacemaker.  ?Social History:  ?She reports that she has never smoked. She has never used smokeless tobacco. She reports current alcohol use. She reports that she does not use drugs. ? ?Review of Systems: ?Review of Systems  ?Constitutional:  Negative for malaise/fatigue and weight loss.  ?HENT:  Negative for hearing loss and tinnitus.   ?Eyes:  Negative for blurred vision and double vision.  ?Respiratory:  Negative for cough, sputum production, shortness of breath and wheezing.   ?Cardiovascular:  Negative for chest pain, palpitations, orthopnea, claudication, leg swelling and PND.  ?Gastrointestinal:  Negative for abdominal pain, blood in stool, constipation, diarrhea, heartburn, melena, nausea and vomiting.  ?Genitourinary: Negative.   ?Musculoskeletal:  Negative for falls, joint pain and myalgias.  ?Skin:  Negative for rash.  ?Neurological:  Negative for dizziness,  tingling, sensory change, weakness and headaches.  ?Endo/Heme/Allergies:  Negative for polydipsia.  ?Psychiatric/Behavioral: Negative.  Negative for depression, memory loss, substance abuse and suicidal ideas. The patient is not nervous/anxious and does not have insomnia.   ?All other systems reviewed and are negative. ? ?Physical Exam: ?Estimated body mass index is 34.88 kg/m? as calculated from the following: ?  Height as of 06/09/21: 5\' 4"  (1.626 m). ?  Weight as of 06/09/21: 203 lb 3.2 oz (92.2 kg). ?There were no vitals taken for this visit. ?General Appearance: Well nourished, in no apparent distress.  ?Eyes: PERRLA, EOMs, conjunctiva no swelling or erythema, normal fundi and vessels.  ?Sinuses: No Frontal/maxillary tenderness  ?ENT/Mouth: Ext aud canals clear, normal light reflex with TMs without erythema, bulging. Good dentition. No erythema, swelling, or exudate on post pharynx. Tonsils not swollen or erythematous. Hearing normal.  ?Neck: Supple, thyroid normal. No bruits  ?Respiratory:  Respiratory effort normal, BS equal bilaterally without rales, rhonchi, wheezing or stridor.  ?Cardio: RRR without murmurs, rubs or gallops. Brisk peripheral pulses without edema.  ?Chest: symmetric, with normal excu

## 2021-06-23 ENCOUNTER — Ambulatory Visit: Payer: 59 | Admitting: Nurse Practitioner

## 2021-06-23 DIAGNOSIS — K760 Fatty (change of) liver, not elsewhere classified: Secondary | ICD-10-CM

## 2021-06-23 DIAGNOSIS — R7309 Other abnormal glucose: Secondary | ICD-10-CM

## 2021-06-23 DIAGNOSIS — Z79899 Other long term (current) drug therapy: Secondary | ICD-10-CM

## 2021-06-23 DIAGNOSIS — E559 Vitamin D deficiency, unspecified: Secondary | ICD-10-CM

## 2021-06-23 DIAGNOSIS — E039 Hypothyroidism, unspecified: Secondary | ICD-10-CM

## 2021-06-23 DIAGNOSIS — E669 Obesity, unspecified: Secondary | ICD-10-CM

## 2021-06-23 DIAGNOSIS — F325 Major depressive disorder, single episode, in full remission: Secondary | ICD-10-CM

## 2021-06-23 DIAGNOSIS — K219 Gastro-esophageal reflux disease without esophagitis: Secondary | ICD-10-CM

## 2021-06-23 DIAGNOSIS — E782 Mixed hyperlipidemia: Secondary | ICD-10-CM

## 2021-06-27 ENCOUNTER — Other Ambulatory Visit: Payer: 59

## 2021-07-12 NOTE — Progress Notes (Deleted)
FOLLOW UP 6 MONTH   Assessment and Plan: Robin Montes was seen today for follow-up.  Diagnoses and all orders for this visit:  Hypothyroidism, unspecified type continue medications the same pending lab results reminded to take on an empty stomach 30-1mins before food.  check TSH level  Gastroesophageal reflux disease, esophagitis presence not specified Well managed by lifestyle/PRN OTC agents Discussed diet, avoiding triggers and other lifestyle changes    Depression, major, in remission (HCC) No medication at this time, doing well Discussed stress management techniques  Discussed, increase water,intake & good sleep hygiene  Discussed increasing exercise & vegetables in diet  Mixed hyperlipidemia No medications: Discussed dietary and exercise modifications Low fat diet Check LIPID, CBC, CMP   Obesity (BMI 30.0-34.9) Discussed dietary and exercise modifications  Vitamin D deficiency Continue supplementation to maintain goal of 70-100 Taking Vitamin D 5,000 IU daily Defer vitamin D level   Abnormal glucose Discussed dietary and exercise modifications Check CMP  Insomnia, unspecified type Discussed good sleep hygiene, decrease stimulation prior to sleep Increase day time activity Avoid caffeine in evenings Try OTC Melatonin 10mg  We can consider a sleep aid if these do not work  Medication management Continued    Discussed med's effects and SE's. Screening labs and tests as requested with regular follow-up as recommended.  Further disposition pending results if labs check today. Discussed med's effects and SE's.   Over 30 minutes of face to face interview, exam, counseling, chart review, and critical decision making was performed.   Future Appointments  Date Time Provider Department Center  07/13/2021 10:00 AM 09/12/2021, NP [facility] None  12/11/2021  9:00 AM 02/10/2022, NP [facility] None     HPI  61 y.o. female. 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      BMI is There is no height or weight on file to calculate BMI., she has been working on diet and exercise. Wt Readings from Last 3 Encounters:  06/09/21 203 lb 3.2 oz (92.2 kg)  02/17/21 198 lb 3.2 oz (89.9 kg)  12/09/20 197 lb 12.8 oz (89.7 kg)   Today their BP is   She does workout. She denies chest pain, shortness of breath, dizziness.    She is not on cholesterol medication and denies myalgias. Her cholesterol is not at goal. The cholesterol last visit was:   Lab Results  Component Value Date   CHOL 199 02/17/2021   HDL 42 (L) 02/17/2021   LDLCALC 121 (H) 02/17/2021   TRIG 238 (H) 02/17/2021   CHOLHDL 4.7 02/17/2021   She has been working on diet and exercise for prediabetes, she is not on bASA, she is not on ACE/ARB (BP controlled) and denies foot ulcerations, increased appetite, nausea, paresthesia of the feet, polydipsia, polyuria, visual disturbances, vomiting and weight loss. Last A1C in the office was:  Lab Results  Component Value Date   HGBA1C 5.8 (H) 12/09/2020   She is on thyroid medication she is on 1 pill a day but admits that she forgets.  Lab Results  Component Value Date   TSH 5.77 (H) 12/09/2020   Last GFR: Lab Results  Component Value Date   GFRNONAA 97 02/19/2020   Patient is on Vitamin D supplement, she is  on 5000 IU every day.    Lab Results  Component Value Date   VD25OH 36 12/09/2020      Current Medications:  Current Outpatient Medications on File Prior to Visit  Medication Sig Dispense Refill   Calcium Carbonate (CALCIUM 600 PO) Take by mouth.     Cholecalciferol (VITAMIN D PO) Take 5,000  Int'l Units by mouth daily.     IRON PO Take by mouth daily.     levothyroxine (SYNTHROID) 125 MCG tablet Take  1 tablet  Daily  on an empty stomach with only water for 30 minutes & no Antacid meds, Calcium or Magnesium for 4 hours & avoid Biotin. 90 tablet 0   MAGNESIUM PO Take by mouth.     OVER THE COUNTER MEDICATION daily. Potassium- pt unsure of dose     rosuvastatin (CRESTOR) 5 MG tablet Take 1 tablet (5 mg total) by mouth daily. 30 tablet 3   No current facility-administered medications on file prior to visit.   Allergies:  Allergies  Allergen Reactions   Atorvastatin     Fatigue    Medical History:  She has Abnormal glucose (prediabetes); Hyperlipidemia; Vitamin D deficiency; Hypothyroidism; Depression, major, in remission (HCC); GERD (gastroesophageal reflux disease); Medication management; Obesity (BMI 30.0-34.9); Insomnia; Fatty liver; and B12 deficiency on their problem list. Health Maintenance:   Immunization History  Administered Date(s) Administered   Influenza Split 02/08/2014   Influenza, Seasonal, Injecte, Preservative Fre 02/21/2015   PFIZER(Purple Top)SARS-COV-2 Vaccination 08/09/2019   Td 03/12/2002   Tdap 02/08/2014   Tetanus: 2015  Flu vaccine: 2019 declined this year- husband lost job Shingrix: declines  LMP: No LMP recorded. (Menstrual status: Perimenopausal). Pap: 08/2017 MGM: 12/2018-  Colonoscopy: 2019 can't find report - patient reports she has documentation, will present/mail EGD: n/a  Last Dental Exam: 2019, goes q6 month Last Eye Exam: Remote, will schedule  Patient Care Team: Lucky CowboyMcKeown, William, MD as PCP - General (Internal Medicine) Dorisann FramesBalan, Bindubal, MD as Consulting Physician (Endocrinology)  Surgical History:  She has a past surgical history that includes thyroid ablation (2008). Family History:  Herfamily history includes Aneurysm in her brother and sister; Arthritis in her sister; Breast cancer in her mother; Heart attack (age of  onset: 3135) in her paternal grandfather; Hyperlipidemia in her father and mother; Hypertension in her mother. Mother with pacemaker.  Social History:  She reports that she has never smoked. She has never used smokeless tobacco. She reports current alcohol use. She reports that she does not use drugs.  Review of Systems: Review of Systems  Constitutional:  Negative for malaise/fatigue and weight loss.  HENT:  Negative for hearing loss and tinnitus.   Eyes:  Negative for blurred vision and double vision.  Respiratory:  Negative for cough, sputum production, shortness of breath and wheezing.   Cardiovascular:  Negative for chest pain, palpitations, orthopnea, claudication, leg swelling and PND.  Gastrointestinal:  Negative for abdominal pain, blood in stool, constipation, diarrhea, heartburn, melena, nausea and vomiting.  Genitourinary: Negative.   Musculoskeletal:  Negative for falls, joint pain and myalgias.  Skin:  Negative for rash.  Neurological:  Negative for dizziness, tingling, sensory change, weakness and headaches.  Endo/Heme/Allergies:  Negative for polydipsia.  Psychiatric/Behavioral: Negative.  Negative for depression, memory loss, substance abuse and suicidal ideas. The patient is not nervous/anxious and does not have insomnia.   All other systems reviewed and are negative.  Physical Exam: Estimated body mass index is 34.88 kg/m as calculated from the following:  Height as of 06/09/21: 5\' 4"  (1.626 m).   Weight as of 06/09/21: 203 lb 3.2 oz (92.2 kg). There were no vitals taken for this visit. General Appearance: Well nourished, in no apparent distress.  Eyes: PERRLA, EOMs, conjunctiva no swelling or erythema, normal fundi and vessels.  Sinuses: No Frontal/maxillary tenderness  ENT/Mouth: Ext aud canals clear, normal light reflex with TMs without erythema, bulging. Good dentition. No erythema, swelling, or exudate on post pharynx. Tonsils not swollen or erythematous. Hearing  normal.  Neck: Supple, thyroid normal. No bruits  Respiratory: Respiratory effort normal, BS equal bilaterally without rales, rhonchi, wheezing or stridor.  Cardio: RRR without murmurs, rubs or gallops. Brisk peripheral pulses without edema.  Chest: symmetric, with normal excursions and percussion.  Breasts: Symmetric, without lumps, nipple discharge, retractions.  Abdomen: Soft, nontender, no guarding, rebound, hernias, masses, or organomegaly.  Lymphatics: Non tender without lymphadenopathy.  Genitourinary: Normal internal and external structures Musculoskeletal: Full ROM all peripheral extremities,5/5 strength, and normal gait.  Skin: Warm, dry without rashes, lesions, ecchymosis. Neuro: Cranial nerves intact, reflexes equal bilaterally. Normal muscle tone, no cerebellar symptoms. Sensation intact.  Psych: Awake and oriented X 3, normal affect, Insight and Judgment appropriate.    06/11/21, NP 11:44 AM [facility]

## 2021-07-13 ENCOUNTER — Ambulatory Visit: Payer: 59 | Admitting: Nurse Practitioner

## 2021-07-24 ENCOUNTER — Other Ambulatory Visit: Payer: Self-pay

## 2021-07-24 DIAGNOSIS — E039 Hypothyroidism, unspecified: Secondary | ICD-10-CM

## 2021-07-24 MED ORDER — LEVOTHYROXINE SODIUM 125 MCG PO TABS: ORAL_TABLET | ORAL | 0 refills | Status: DC

## 2021-07-27 ENCOUNTER — Encounter: Payer: 59 | Admitting: Adult Health

## 2021-08-10 ENCOUNTER — Ambulatory Visit: Payer: 59 | Admitting: Nurse Practitioner

## 2021-09-17 ENCOUNTER — Other Ambulatory Visit: Payer: Self-pay | Admitting: Nurse Practitioner

## 2021-09-17 DIAGNOSIS — E782 Mixed hyperlipidemia: Secondary | ICD-10-CM

## 2021-10-14 ENCOUNTER — Other Ambulatory Visit: Payer: Self-pay | Admitting: Nurse Practitioner

## 2021-10-14 DIAGNOSIS — E782 Mixed hyperlipidemia: Secondary | ICD-10-CM

## 2021-10-21 ENCOUNTER — Other Ambulatory Visit: Payer: Self-pay | Admitting: Internal Medicine

## 2021-10-21 DIAGNOSIS — E039 Hypothyroidism, unspecified: Secondary | ICD-10-CM

## 2021-10-21 MED ORDER — LEVOTHYROXINE SODIUM 125 MCG PO TABS: ORAL_TABLET | ORAL | 0 refills | Status: DC

## 2021-11-14 ENCOUNTER — Other Ambulatory Visit: Payer: Self-pay | Admitting: Nurse Practitioner

## 2021-11-14 DIAGNOSIS — E782 Mixed hyperlipidemia: Secondary | ICD-10-CM

## 2021-12-07 NOTE — Progress Notes (Deleted)
 COMPLETE PHYSICAL  Assessment and Plan:   Marisue Ivan was seen today for annual exam.  Diagnoses and all orders for this visit:  Encounter for general adult medical examination with abnormal findings  Due Annually  Pt wants to defer mammogram until new insurance kicks in  Mixed hyperlipidemia -     CBC with Differential/Platelet -     COMPLETE METABOLIC PANEL WITH GFR -     Lipid panel  Continue diet and exercise  Hypothyroidism, unspecified type -     TSH - Continue current dose of levothyroxine, will adjust if needed pending lab results  Gastroesophageal reflux disease, unspecified whether esophagitis present  Continue Pepto or maalox as needed and behavior modifications -     Magnesium  Abnormal glucose -     Hemoglobin A1c Counseled at length on diet and exercise  Obesity (BMI 30.0-34.9)  Counseled at length on diet and exercise  Will stop sweet sodas . Increase fresh fruits/vegetables and fiber  Vitamin D deficiency -     VITAMIN D 25 Hydroxy (Vit-D Deficiency, Fractures)  Continue Vit D supplementation  Medication management  Magnesium  Depression, major, in remission (HCC)  Continue behavior modifications, practice good sleep hygiene  Insomnia, unspecified type Practice good sleep hygiene and melatonin PRN  Screening for hematuria or proteinuria -     Urinalysis, Routine w reflex microscopic -     Microalbumin / creatinine urine ratio  Screening for ischemic heart disease -     EKG 12-Lead  Lump in neck -     US Soft Tissue Head/Neck (NON-THYROID); Future    Discussed med's effects and SE's. Screening labs and tests as requested with regular follow-up as recommended.  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   BMI is There is no height or weight on file to calculate BMI., she has been working on diet and exercise. Has been skipping meals.  Does yard work.  Wt Readings from Last 3 Encounters:  06/09/21 203 lb 3.2 oz (92.2 kg)  02/17/21 198 lb 3.2 oz (89.9 kg)  12/09/20 197 lb 12.8 oz (89.7 kg)   Today their BP is   BP Readings from Last 3 Encounters:  06/09/21 132/78  02/17/21 132/70  12/09/20 128/90    She does workout. She denies chest pain, shortness of breath, dizziness.   Patient has noticed   She is not on cholesterol medication and denies myalgias. Her cholesterol is not at goal. The cholesterol last visit was:   Lab Results  Component Value Date   CHOL 199 02/17/2021   HDL 42 (L) 02/17/2021   LDLCALC 121 (H) 02/17/2021   TRIG 238 (H) 02/17/2021   CHOLHDL 4.7 02/17/2021   She has been working on diet and exercise for prediabetes, she is not on bASA, she is not on ACE/ARB (BP controlled) and denies foot ulcerations, increased appetite, nausea, paresthesia of the feet, polydipsia, polyuria, visual disturbances, vomiting and weight loss. Last A1C in the office was:  Lab Results  Component Value Date   HGBA1C 5.8 (H) 12/09/2020   She is on thyroid  medication 164mcg qd taking regularly Lab Results  Component Value Date   TSH 5.77 (H) 12/09/2020   Last GFR: Lab Results  Component Value Date   GFRNONAA 97 02/19/2020   Patient is on Vitamin D supplement, she is on 5000 IU every day.    Lab Results  Component Value Date   VD25OH 36 12/09/2020     Pt does have periods of low mood because family lives long distance and husband works long hours. Does have friends but long distance away.   Current Medications:  Current Outpatient  Medications on File Prior to Visit  Medication Sig Dispense Refill   Calcium Carbonate (CALCIUM 600 PO) Take by mouth.     Cholecalciferol (VITAMIN D PO) Take 5,000 Int'l Units by mouth daily.     IRON PO Take by mouth daily.     levothyroxine (SYNTHROID) 125 MCG tablet Take  1 tablet  Daily  on an empty stomach with only water for 30 minutes & no Antacid meds, Calcium or Magnesium for 4 hours & avoid Biotin. 90 tablet 0   MAGNESIUM PO Take by mouth.     OVER THE COUNTER MEDICATION daily. Potassium- pt unsure of dose     rosuvastatin (CRESTOR) 5 MG tablet Take 1 tablet by mouth once daily 30 tablet 6   No current facility-administered medications on file prior to visit.   Allergies:  Allergies  Allergen Reactions   Atorvastatin     Fatigue    Medical History:  She has Abnormal glucose (prediabetes); Hyperlipidemia; Vitamin D deficiency; Hypothyroidism; Depression, major, in remission ([place]); GERD (gastroesophageal reflux disease); Medication management; Obesity (BMI 30.0-34.9); Insomnia; Fatty liver; and B12 deficiency on their problem list. Health Maintenance:   Immunization History  Administered Date(s) Administered   Influenza Split 02/08/2014   Influenza, Seasonal, Injecte, Preservative Fre 02/21/2015   PFIZER(Purple Top)SARS-COV-2 Vaccination 08/09/2019   Td 03/12/2002   Tdap 02/08/2014   Tetanus: 2015  Flu vaccine: 2019 declined this year- husband lost job Shingrix: declines  LMP: No LMP recorded. (Menstrual status: Perimenopausal). Pap: 08/2017 normal due 2024 MGM: 12/2018- due    Colonoscopy: 2019 can't find report - patient reports she has documentation, will present/mail EGD: n/a  Last Dental Exam: 2019, goes q6 month Last Eye Exam: Remote, will schedule  Patient Care Team: Unk Pinto, MD as PCP - General (Internal Medicine) Jacelyn Pi, MD as Consulting Physician (Endocrinology)  Surgical History:  She has a past surgical history that includes  thyroid ablation (2008). Family History:  Herfamily history includes Aneurysm in her brother and sister; Arthritis in her sister; Breast cancer in her mother; Heart attack (age of onset: 11) in her paternal grandfather; Hyperlipidemia in her father and mother; Hypertension in her mother. Mother with pacemaker.  Social History:  She reports that she has never smoked. She has never used smokeless tobacco. She reports current alcohol use. She reports that she does not use drugs.  Review of Systems: Review of Systems  Constitutional:  Negative for chills, fever, malaise/fatigue and weight loss.  HENT:  Negative for congestion, hearing loss, sinus pain, sore throat and tinnitus.   Eyes:  Negative for blurred vision and double vision.  Respiratory:  Negative for cough, hemoptysis, sputum production, shortness of breath and wheezing.   Cardiovascular:  Negative for chest pain, palpitations, orthopnea, claudication, leg swelling and PND.  Gastrointestinal:  Positive for heartburn (occ with spicy foods). Negative for abdominal pain, blood in stool, constipation, diarrhea, melena, nausea and vomiting.  Genitourinary:  Negative.  Negative for dysuria and urgency.  Musculoskeletal:  Negative for back pain, falls, joint pain, myalgias and neck pain.  Skin:  Negative for rash.  Neurological:  Negative for dizziness, tingling, tremors, sensory change, weakness and headaches.  Endo/Heme/Allergies:  Negative for polydipsia. Does not bruise/bleed easily.  Psychiatric/Behavioral:  Positive for depression (feels low mood occasionally). Negative for memory loss, substance abuse and suicidal ideas. The patient has insomnia (uses melatonin as needed, wakes frequently). The patient is not nervous/anxious.   All other systems reviewed and are negative.   Physical Exam: Estimated body mass index is 34.88 kg/m as calculated from the following:   Height as of 06/09/21: 5\' 4"  (1.626 m).   Weight as of 06/09/21: 203 lb  3.2 oz (92.2 kg). There were no vitals taken for this visit. General Appearance: Well nourished, in no apparent distress.  Eyes: PERRLA, EOMs, conjunctiva no swelling or erythema, normal fundi and vessels.  Sinuses: No Frontal/maxillary tenderness  ENT/Mouth: Ext aud canals clear, normal light reflex with TMs without erythema, bulging. Good dentition. No erythema, swelling, or exudate on post pharynx. Tonsils not swollen or erythematous. Hearing normal.  Neck: 1-2 cm soft moveable non tender area under chin Respiratory: Respiratory effort normal, BS equal bilaterally without rales, rhonchi, wheezing or stridor.  Cardio: RRR without murmurs, rubs or gallops. Brisk peripheral pulses without edema.  Chest: symmetric, with normal excursions and percussion.  Breasts:Defer.  Abdomen: Soft, nontender, no guarding, rebound, hernias, masses, or organomegaly.  Lymphatics: Non tender without lymphadenopathy.  Genitourinary:Defer Musculoskeletal: Full ROM all peripheral extremities,5/5 strength, and normal gait.  Skin: Warm, dry without rashes, lesions, ecchymosis. Neuro: Cranial nerves intact, reflexes equal bilaterally. Normal muscle tone, no cerebellar symptoms. Sensation intact.  Psych: Awake and oriented X 3, normal affect, Insight and Judgment appropriate.  EKG: No ST changes  Averey Trompeter E  9:33 AM [facility]

## 2021-12-11 ENCOUNTER — Encounter: Payer: BC Managed Care – PPO | Admitting: Nurse Practitioner

## 2022-01-08 NOTE — Progress Notes (Unsigned)
 COMPLETE PHYSICAL  Assessment and Plan:   Robin Montes was seen today for annual exam.  Diagnoses and all orders for this visit:  Encounter for general adult medical examination with abnormal findings  Due Annually  Pt wants to defer mammogram until new insurance kicks in  Mixed hyperlipidemia -     CBC with Differential/Platelet -     COMPLETE METABOLIC PANEL WITH GFR -     Lipid panel  Continue diet and exercise  Hypothyroidism, unspecified type -     TSH - Continue current dose of levothyroxine, will adjust if needed pending lab results  Gastroesophageal reflux disease, unspecified whether esophagitis present  Continue Pepto or maalox as needed and behavior modifications -     Magnesium  Abnormal glucose -     Hemoglobin A1c Counseled at length on diet and exercise  Obesity (BMI 30.0-34.9)  Counseled at length on diet and exercise  Will stop sweet sodas . Increase fresh fruits/vegetables and fiber  Vitamin D deficiency -     VITAMIN D 25 Hydroxy (Vit-D Deficiency, Fractures)  Continue Vit D supplementation  Medication management  Magnesium  Depression, major, in remission (HCC)  Continue behavior modifications, practice good sleep hygiene  Insomnia, unspecified type Practice good sleep hygiene and melatonin PRN  Screening for hematuria or proteinuria -     Urinalysis, Routine w reflex microscopic -     Microalbumin / creatinine urine ratio  Screening for ischemic heart disease -     EKG 12-Lead  Lump in neck -     US Soft Tissue Head/Neck (NON-THYROID); Future    Discussed med's effects and SE's. Screening labs and tests as requested with regular follow-up as recommended.  Future Appointments  Date Time Provider Department Center  01/09/2022  9:00 AM [name], NP [facility] None     HPI  61 y.o. female. 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   BMI is There is no height or weight on file to calculate BMI., she has been working on diet and exercise. Has been skipping meals.  Does yard work.  Wt Readings from Last 3 Encounters:  06/09/21 203 lb 3.2 oz (92.2 kg)  02/17/21 198 lb 3.2 oz (89.9 kg)  12/09/20 197 lb 12.8 oz (89.7 kg)   Today their BP is   BP Readings from Last 3 Encounters:  06/09/21 132/78  02/17/21 132/70  12/09/20 128/90    She does workout. She denies chest pain, shortness of breath, dizziness.   Patient has noticed   She is not on cholesterol medication and denies myalgias. Her cholesterol is not at goal. The cholesterol last visit was:   Lab Results  Component Value Date   CHOL 199 02/17/2021   HDL 42 (L) 02/17/2021   LDLCALC 121 (H) 02/17/2021   TRIG 238 (H) 02/17/2021   CHOLHDL 4.7 02/17/2021   She has been working on diet and exercise for prediabetes, she is not on bASA, she is not on ACE/ARB (BP controlled) and denies foot ulcerations, increased appetite, nausea, paresthesia of the feet, polydipsia, polyuria, visual disturbances, vomiting and weight loss. Last A1C in the office was:  Lab Results  Component Value Date   HGBA1C 5.8 (H) 12/09/2020   She is on thyroid  medication 164mcg qd taking regularly Lab Results  Component Value Date   TSH 5.77 (H) 12/09/2020   Last GFR: Lab Results  Component Value Date   GFRNONAA 97 02/19/2020   Patient is on Vitamin D supplement, she is on 5000 IU every day.    Lab Results  Component Value Date   VD25OH 36 12/09/2020     Pt does have periods of low mood because family lives long distance and husband works long hours. Does have friends but long distance away.   Current Medications:  Current Outpatient  Medications on File Prior to Visit  Medication Sig Dispense Refill   Calcium Carbonate (CALCIUM 600 PO) Take by mouth.     Cholecalciferol (VITAMIN D PO) Take 5,000 Int'l Units by mouth daily.     IRON PO Take by mouth daily.     levothyroxine (SYNTHROID) 125 MCG tablet Take  1 tablet  Daily  on an empty stomach with only water for 30 minutes & no Antacid meds, Calcium or Magnesium for 4 hours & avoid Biotin. 90 tablet 0   MAGNESIUM PO Take by mouth.     OVER THE COUNTER MEDICATION daily. Potassium- pt unsure of dose     rosuvastatin (CRESTOR) 5 MG tablet Take 1 tablet by mouth once daily 30 tablet 6   No current facility-administered medications on file prior to visit.   Allergies:  Allergies  Allergen Reactions   Atorvastatin     Fatigue    Medical History:  She has Abnormal glucose (prediabetes); Hyperlipidemia; Vitamin D deficiency; Hypothyroidism; Depression, major, in remission ([place]); GERD (gastroesophageal reflux disease); Medication management; Obesity (BMI 30.0-34.9); Insomnia; Fatty liver; and B12 deficiency on their problem list. Health Maintenance:   Immunization History  Administered Date(s) Administered   Influenza Split 02/08/2014   Influenza, Seasonal, Injecte, Preservative Fre 02/21/2015   PFIZER(Purple Top)SARS-COV-2 Vaccination 08/09/2019   Td 03/12/2002   Tdap 02/08/2014   Tetanus: 2015  Flu vaccine: 2019 declined this year- husband lost job Shingrix: declines  LMP: No LMP recorded. (Menstrual status: Perimenopausal). Pap: 08/2017 normal due 2024 MGM: 12/2018- due    Colonoscopy: 2019 can't find report - patient reports she has documentation, will present/mail EGD: n/a  Last Dental Exam: 2019, goes q6 month Last Eye Exam: Remote, will schedule  Patient Care Team: Unk Pinto, MD as PCP - General (Internal Medicine) Jacelyn Pi, MD as Consulting Physician (Endocrinology)  Surgical History:  She has a past surgical history that includes  thyroid ablation (2008). Family History:  Herfamily history includes Aneurysm in her brother and sister; Arthritis in her sister; Breast cancer in her mother; Heart attack (age of onset: 11) in her paternal grandfather; Hyperlipidemia in her father and mother; Hypertension in her mother. Mother with pacemaker.  Social History:  She reports that she has never smoked. She has never used smokeless tobacco. She reports current alcohol use. She reports that she does not use drugs.  Review of Systems: Review of Systems  Constitutional:  Negative for chills, fever, malaise/fatigue and weight loss.  HENT:  Negative for congestion, hearing loss, sinus pain, sore throat and tinnitus.   Eyes:  Negative for blurred vision and double vision.  Respiratory:  Negative for cough, hemoptysis, sputum production, shortness of breath and wheezing.   Cardiovascular:  Negative for chest pain, palpitations, orthopnea, claudication, leg swelling and PND.  Gastrointestinal:  Positive for heartburn (occ with spicy foods). Negative for abdominal pain, blood in stool, constipation, diarrhea, melena, nausea and vomiting.  Genitourinary:  Negative.  Negative for dysuria and urgency.  Musculoskeletal:  Negative for back pain, falls, joint pain, myalgias and neck pain.  Skin:  Negative for rash.  Neurological:  Negative for dizziness, tingling, tremors, sensory change, weakness and headaches.  Endo/Heme/Allergies:  Negative for polydipsia. Does not bruise/bleed easily.  Psychiatric/Behavioral:  Positive for depression (feels low mood occasionally). Negative for memory loss, substance abuse and suicidal ideas. The patient has insomnia (uses melatonin as needed, wakes frequently). The patient is not nervous/anxious.   All other systems reviewed and are negative.   Physical Exam: Estimated body mass index is 34.88 kg/m as calculated from the following:   Height as of 06/09/21: 5\' 4"  (1.626 m).   Weight as of 06/09/21: 203 lb  3.2 oz (92.2 kg). There were no vitals taken for this visit. General Appearance: Well nourished, in no apparent distress.  Eyes: PERRLA, EOMs, conjunctiva no swelling or erythema, normal fundi and vessels.  Sinuses: No Frontal/maxillary tenderness  ENT/Mouth: Ext aud canals clear, normal light reflex with TMs without erythema, bulging. Good dentition. No erythema, swelling, or exudate on post pharynx. Tonsils not swollen or erythematous. Hearing normal.  Neck: 1-2 cm soft moveable non tender area under chin Respiratory: Respiratory effort normal, BS equal bilaterally without rales, rhonchi, wheezing or stridor.  Cardio: RRR without murmurs, rubs or gallops. Brisk peripheral pulses without edema.  Chest: symmetric, with normal excursions and percussion.  Breasts:Defer.  Abdomen: Soft, nontender, no guarding, rebound, hernias, masses, or organomegaly.  Lymphatics: Non tender without lymphadenopathy.  Genitourinary:Defer Musculoskeletal: Full ROM all peripheral extremities,5/5 strength, and normal gait.  Skin: Warm, dry without rashes, lesions, ecchymosis. Neuro: Cranial nerves intact, reflexes equal bilaterally. Normal muscle tone, no cerebellar symptoms. Sensation intact.  Psych: Awake and oriented X 3, normal affect, Insight and Judgment appropriate.  EKG: No ST changes  Robin Montes E  9:38 AM [facility]

## 2022-01-09 ENCOUNTER — Ambulatory Visit (INDEPENDENT_AMBULATORY_CARE_PROVIDER_SITE_OTHER): Payer: BC Managed Care – PPO | Admitting: Nurse Practitioner

## 2022-01-09 ENCOUNTER — Encounter: Payer: Self-pay | Admitting: Nurse Practitioner

## 2022-01-09 VITALS — BP 124/76 | HR 88 | Temp 97.7°F | Ht 64.0 in | Wt 197.2 lb

## 2022-01-09 DIAGNOSIS — Z0001 Encounter for general adult medical examination with abnormal findings: Secondary | ICD-10-CM

## 2022-01-09 DIAGNOSIS — Z0000 Encounter for general adult medical examination without abnormal findings: Secondary | ICD-10-CM

## 2022-01-09 DIAGNOSIS — F325 Major depressive disorder, single episode, in full remission: Secondary | ICD-10-CM

## 2022-01-09 DIAGNOSIS — Z23 Encounter for immunization: Secondary | ICD-10-CM | POA: Diagnosis not present

## 2022-01-09 DIAGNOSIS — R7309 Other abnormal glucose: Secondary | ICD-10-CM

## 2022-01-09 DIAGNOSIS — I10 Essential (primary) hypertension: Secondary | ICD-10-CM | POA: Diagnosis not present

## 2022-01-09 DIAGNOSIS — Z13220 Encounter for screening for lipoid disorders: Secondary | ICD-10-CM | POA: Diagnosis not present

## 2022-01-09 DIAGNOSIS — K219 Gastro-esophageal reflux disease without esophagitis: Secondary | ICD-10-CM

## 2022-01-09 DIAGNOSIS — E782 Mixed hyperlipidemia: Secondary | ICD-10-CM

## 2022-01-09 DIAGNOSIS — Z79899 Other long term (current) drug therapy: Secondary | ICD-10-CM | POA: Diagnosis not present

## 2022-01-09 DIAGNOSIS — Z136 Encounter for screening for cardiovascular disorders: Secondary | ICD-10-CM | POA: Diagnosis not present

## 2022-01-09 DIAGNOSIS — Z1389 Encounter for screening for other disorder: Secondary | ICD-10-CM

## 2022-01-09 DIAGNOSIS — E559 Vitamin D deficiency, unspecified: Secondary | ICD-10-CM | POA: Diagnosis not present

## 2022-01-09 DIAGNOSIS — E669 Obesity, unspecified: Secondary | ICD-10-CM

## 2022-01-09 DIAGNOSIS — I700 Atherosclerosis of aorta: Secondary | ICD-10-CM

## 2022-01-09 DIAGNOSIS — G629 Polyneuropathy, unspecified: Secondary | ICD-10-CM

## 2022-01-09 DIAGNOSIS — Z131 Encounter for screening for diabetes mellitus: Secondary | ICD-10-CM

## 2022-01-09 DIAGNOSIS — E039 Hypothyroidism, unspecified: Secondary | ICD-10-CM

## 2022-01-09 DIAGNOSIS — G4700 Insomnia, unspecified: Secondary | ICD-10-CM

## 2022-01-09 MED ORDER — GABAPENTIN 100 MG PO CAPS: ORAL_CAPSULE | ORAL | 2 refills | Status: DC

## 2022-01-09 NOTE — Patient Instructions (Signed)

## 2022-01-10 ENCOUNTER — Other Ambulatory Visit: Payer: Self-pay | Admitting: Nurse Practitioner

## 2022-01-10 DIAGNOSIS — E039 Hypothyroidism, unspecified: Secondary | ICD-10-CM

## 2022-01-10 DIAGNOSIS — R8290 Unspecified abnormal findings in urine: Secondary | ICD-10-CM

## 2022-01-11 LAB — LIPID PANEL
Cholesterol: 183 mg/dL (ref <200)
HDL: 49 mg/dL — ABNORMAL LOW (ref >=50)
LDL Cholesterol (Calc): 110 mg/dL (calc) — ABNORMAL HIGH
Non-HDL Cholesterol (Calc): 134 mg/dL (calc) — ABNORMAL HIGH (ref <130)
Total CHOL/HDL Ratio: 3.7 (calc) (ref <5.0)
Triglycerides: 129 mg/dL (ref <150)

## 2022-01-11 LAB — URINALYSIS, ROUTINE W REFLEX MICROSCOPIC
Bilirubin Urine: NEGATIVE
Glucose, UA: NEGATIVE
Hgb urine dipstick: NEGATIVE
Hyaline Cast: NONE SEEN /LPF
Ketones, ur: NEGATIVE
Nitrite: NEGATIVE
Protein, ur: NEGATIVE
RBC / HPF: NONE SEEN /HPF (ref 0–2)
Specific Gravity, Urine: 1.025 (ref 1.001–1.035)
pH: 5.5 (ref 5.0–8.0)

## 2022-01-11 LAB — COMPLETE METABOLIC PANEL WITH GFR
AG Ratio: 1.6 (calc) (ref 1.0–2.5)
ALT: 37 U/L — ABNORMAL HIGH (ref 6–29)
AST: 26 U/L (ref 10–35)
Albumin: 4.5 g/dL (ref 3.6–5.1)
Alkaline phosphatase (APISO): 71 U/L (ref 37–153)
BUN: 17 mg/dL (ref 7–25)
CO2: 28 mmol/L (ref 20–32)
Calcium: 9.4 mg/dL (ref 8.6–10.4)
Chloride: 105 mmol/L (ref 98–110)
Creat: 0.8 mg/dL (ref 0.50–1.03)
Globulin: 2.8 g/dL (calc) (ref 1.9–3.7)
Glucose, Bld: 98 mg/dL (ref 65–99)
Potassium: 4 mmol/L (ref 3.5–5.3)
Sodium: 141 mmol/L (ref 135–146)
Total Bilirubin: 0.6 mg/dL (ref 0.2–1.2)
Total Protein: 7.3 g/dL (ref 6.1–8.1)
eGFR: 85 mL/min/{1.73_m2} (ref >=60)

## 2022-01-11 LAB — CBC WITH DIFFERENTIAL/PLATELET
Absolute Monocytes: 269 {cells}/uL (ref 200–950)
Basophils Absolute: 38 {cells}/uL (ref 0–200)
Basophils Relative: 0.6 %
Eosinophils Absolute: 51 {cells}/uL (ref 15–500)
Eosinophils Relative: 0.8 %
HCT: 39.4 % (ref 35.0–45.0)
Hemoglobin: 13.4 g/dL (ref 11.7–15.5)
Lymphs Abs: 2272 {cells}/uL (ref 850–3900)
MCH: 31.6 pg (ref 27.0–33.0)
MCHC: 34 g/dL (ref 32.0–36.0)
MCV: 92.9 fL (ref 80.0–100.0)
MPV: 11.7 fL (ref 7.5–12.5)
Monocytes Relative: 4.2 %
Neutro Abs: 3770 {cells}/uL (ref 1500–7800)
Neutrophils Relative %: 58.9 %
Platelets: 194 Thousand/uL (ref 140–400)
RBC: 4.24 Million/uL (ref 3.80–5.10)
RDW: 12 % (ref 11.0–15.0)
Total Lymphocyte: 35.5 %
WBC: 6.4 Thousand/uL (ref 3.8–10.8)

## 2022-01-11 LAB — MAGNESIUM: Magnesium: 1.7 mg/dL (ref 1.5–2.5)

## 2022-01-11 LAB — MICROALBUMIN / CREATININE URINE RATIO
Creatinine, Urine: 171 mg/dL (ref 20–275)
Microalb Creat Ratio: 5 mcg/mg creat (ref <30)
Microalb, Ur: 0.9 mg/dL

## 2022-01-11 LAB — TSH: TSH: 9.84 mIU/L — ABNORMAL HIGH (ref 0.40–4.50)

## 2022-01-11 LAB — VITAMIN D 25 HYDROXY (VIT D DEFICIENCY, FRACTURES): Vit D, 25-Hydroxy: 32 ng/mL (ref 30–100)

## 2022-01-11 LAB — HEMOGLOBIN A1C
Hgb A1c MFr Bld: 6 % of total Hgb — ABNORMAL HIGH (ref <5.7)
Mean Plasma Glucose: 126 mg/dL
eAG (mmol/L): 7 mmol/L

## 2022-01-11 LAB — MICROSCOPIC MESSAGE

## 2022-01-12 LAB — URINE CULTURE
MICRO NUMBER:: 14131399
SPECIMEN QUALITY:: ADEQUATE

## 2022-01-12 NOTE — Progress Notes (Signed)
LMOM

## 2022-01-16 ENCOUNTER — Other Ambulatory Visit: Payer: Self-pay | Admitting: Internal Medicine

## 2022-01-16 DIAGNOSIS — Z1231 Encounter for screening mammogram for malignant neoplasm of breast: Secondary | ICD-10-CM

## 2022-01-28 ENCOUNTER — Other Ambulatory Visit: Payer: Self-pay | Admitting: Internal Medicine

## 2022-01-28 DIAGNOSIS — E039 Hypothyroidism, unspecified: Secondary | ICD-10-CM

## 2022-01-28 MED ORDER — LEVOTHYROXINE SODIUM 125 MCG PO TABS: ORAL_TABLET | ORAL | 1 refills | Status: DC

## 2022-01-29 ENCOUNTER — Other Ambulatory Visit: Payer: Self-pay | Admitting: Nurse Practitioner

## 2022-01-29 ENCOUNTER — Telehealth: Payer: Self-pay | Admitting: Nurse Practitioner

## 2022-01-29 DIAGNOSIS — E039 Hypothyroidism, unspecified: Secondary | ICD-10-CM

## 2022-01-29 MED ORDER — LEVOTHYROXINE SODIUM 125 MCG PO TABS: ORAL_TABLET | ORAL | 1 refills | Status: DC

## 2022-01-29 NOTE — Telephone Encounter (Signed)
Patient's husband states that you changed her thyroid dose to 1 1/2 pills 3 times a week. Will you please send in a new rx reflecting those changes to [facility] in [place]?

## 2022-02-09 ENCOUNTER — Telehealth: Payer: Self-pay | Admitting: Nurse Practitioner

## 2022-02-09 ENCOUNTER — Other Ambulatory Visit: Payer: Self-pay | Admitting: Nurse Practitioner

## 2022-02-09 DIAGNOSIS — M5441 Lumbago with sciatica, right side: Secondary | ICD-10-CM

## 2022-02-09 MED ORDER — GABAPENTIN 100 MG PO CAPS: ORAL_CAPSULE | ORAL | 2 refills | Status: DC

## 2022-02-09 NOTE — Telephone Encounter (Signed)
I have sent in a refill for Gabapentin 100 mg and can take 2 pills three times a day.  If pain persists with increase of medication should return to specialist

## 2022-02-09 NOTE — Telephone Encounter (Signed)
Husband called saying pt is taking gabapentin 3x a day and is not helping with pain very much. Wanting to know if since she is due for a refill this weekend if the dosage can either be increased.

## 2022-03-20 ENCOUNTER — Ambulatory Visit
Admission: RE | Admit: 2022-03-20 | Discharge: 2022-03-20 | Disposition: A | Discharge status: Home / Self Care | Payer: BC Managed Care – PPO | Source: Ambulatory Visit | Attending: Internal Medicine | Admitting: Internal Medicine

## 2022-03-20 DIAGNOSIS — Z1231 Encounter for screening mammogram for malignant neoplasm of breast: Secondary | ICD-10-CM

## 2022-04-16 NOTE — Progress Notes (Unsigned)
 4 MONTH FOLLOW UP  Assessment and Plan:    Mixed hyperlipidemia -     CBC with Differential/Platelet -     COMPLETE METABOLIC PANEL WITH GFR -     Lipid panel  Continue diet and exercise, Rosuvastatin 5 mg QD  Hypothyroidism, unspecified type -     TSH - Continue current dose of levothyroxine, will adjust if needed pending lab results  Gastroesophageal reflux disease, unspecified whether esophagitis present  Continue Pepto or maalox as needed and behavior modifications  Abnormal glucose Continue diet and exercise  Obesity (BMI 30.0-34.9) Overweight Long discussion about weight loss, diet, and exercise Recommended diet heavy in fruits and veggies and low in animal meats, cheeses, and dairy products, appropriate calorie intake Patient will work on decreasing saturated fats, simple carbs- increase activity Follow up at next visit   Vitamin D deficiency  Continue Vit D supplementation  Medication management  Magnesium  Depression, major, in remission ([place])  Continue behavior modifications, practice good sleep hygiene  Currently not interested in medication   Neuropathy - Will increase Gabapentin to 300 mg 1 tab PPO TID - Monitor and if symptoms are not improving will send back to neurology for further evaluation- could not afford previous MRI that was recommended      Discussed med's effects and SE's. Screening labs and tests as requested with regular follow-up as recommended.  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    Depression is currently well controlled without use of medication, tries to stay active.   She was evaluated at neurology for back and leg pain.  They did want to do a MRI but could not afford. She does continue to have the pain. Descfribes as an electrical pain that is worse at night.  She is on Gabapentin 100 mg 3 tabs BID with no relief.   BMI is Body mass index is 33.92 kg/m., she has been working on diet and exercise. She has been cutting back on sodas.   Does yard work.  Wt Readings from Last 3 Encounters:  04/17/22 197 lb 9.6 oz (89.6 kg)  01/09/22 197 lb 3.2 oz (89.4 kg)  06/09/21 203 lb 3.2 oz (92.2 kg)   Today their BP is BP: 120/74 BP Readings from Last 3 Encounters:  04/17/22 120/74  01/09/22 124/76  06/09/21 132/78   She does workout. She denies chest pain, shortness of breath, dizziness.    She is on cholesterol medication, Rosuvastatin 5 mg daily,  and denies myalgias. Her cholesterol is not at goal. The cholesterol last visit was:   Lab Results  Component Value Date   CHOL 183 01/09/2022   HDL 49 (L) 01/09/2022   LDLCALC 110 (H) 01/09/2022   TRIG 129 01/09/2022   CHOLHDL 3.7 01/09/2022   She has been working on diet and exercise for prediabetes, she is not on bASA, she is not on ACE/ARB (BP controlled) and denies foot ulcerations, increased appetite, nausea, paresthesia of the feet, polydipsia, polyuria, visual disturbances, vomiting and weight loss. Last  A1C in the office was:  Lab Results  Component Value Date   HGBA1C 6.0 (H) 01/09/2022   She is on thyroid medication 163mcg qd 1 tab daily except M-W-F take 1.5 tabs daily Lab Results  Component Value Date   TSH 9.84 (H) 01/09/2022   Last GFR: Lab Results  Component Value Date   EGFR 85 01/09/2022    Patient is on Vitamin D supplement, she is on 5000 IU every day.    Lab Results  Component Value  Date   VD25OH 32 01/09/2022        Current Medications:  Current Outpatient Medications on File Prior to Visit  Medication Sig Dispense Refill   Calcium Carbonate (CALCIUM 600 PO) Take by mouth.     Cholecalciferol (VITAMIN D PO) Take 5,000 Int'l Units by mouth daily.     gabapentin (NEURONTIN) 100 MG capsule 1-2 caps three times a day as needed 180 capsule 2   levothyroxine (SYNTHROID) 125 MCG tablet Take  1 tablet  Daily except Monday, Wednesday , Friday take 1 and 1/2 pill  on an empty stomach with only water for 30 minutes & no Antacid meds, Calcium or Magnesium for 4 hours & avoid Biotin. 108 tablet 1   MAGNESIUM PO Take by mouth.     OVER THE COUNTER MEDICATION daily. Potassium- pt unsure of dose     rosuvastatin (CRESTOR) 5 MG tablet Take 1 tablet by mouth once daily 30 tablet 6   No current facility-administered medications on file prior to visit.   Allergies:  Allergies  Allergen Reactions   Atorvastatin     Fatigue    Medical History:  She has Abnormal glucose (prediabetes); Hyperlipidemia; Vitamin D deficiency; Hypothyroidism; Depression, major, in remission ([place]); GERD (gastroesophageal reflux disease); Medication management; Obesity (BMI 30.0-34.9); Insomnia; Fatty liver; and B12 deficiency on their problem list.    Patient Care Team: Unk Pinto, MD as PCP - General (Internal Medicine) Jacelyn Pi, MD as Consulting Physician (Endocrinology)  Surgical History:  She has a past surgical history that includes thyroid ablation (2008). Family History:  Herfamily history includes Aneurysm in her brother and sister; Arthritis in her sister; Breast cancer in her mother; Heart attack (age of onset: 27) in her paternal grandfather; Hyperlipidemia in her father and mother; Hypertension in her mother. Mother with pacemaker.  Social History:  She reports that she has never smoked. She has never used smokeless tobacco. She reports current alcohol use. She reports that she  does not use drugs.  Review of Systems: Review of Systems  Constitutional:  Negative for chills, fever, malaise/fatigue and weight loss.  HENT:  Negative for congestion, hearing loss, sinus pain, sore throat and tinnitus.   Eyes:  Negative for blurred vision and double vision.  Respiratory:  Negative for cough, hemoptysis, sputum production, shortness of breath and wheezing.   Cardiovascular:  Negative for chest pain, palpitations, orthopnea, claudication, leg swelling and PND.  Gastrointestinal:  Negative for abdominal pain, blood in stool, constipation, diarrhea, heartburn, melena, nausea and vomiting.  Genitourinary: Negative.  Negative for dysuria and urgency.  Musculoskeletal:  Positive for back pain (tingling sensation that radiates to legs). Negative for falls, joint pain, myalgias and neck pain.  Skin:  Negative for rash.  Neurological:  Positive for tingling (legs and back). Negative for dizziness, tremors, sensory change, weakness and headaches.  Endo/Heme/Allergies:  Negative for polydipsia. Does not bruise/bleed easily.  Psychiatric/Behavioral:  Negative for memory loss, substance abuse and suicidal ideas. The patient has  insomnia (uses melatonin as needed, wakes frequently).   All other systems reviewed and are negative.   Physical Exam: Estimated body mass index is 33.92 kg/m as calculated from the following:   Height as of this encounter: 5\' 4"  (1.626 m).   Weight as of this encounter: 197 lb 9.6 oz (89.6 kg). BP 120/74   Pulse 79   Temp 97.6 F (36.4 C)   Resp 16   Ht 5\' 4"  (1.626 m)   Wt 197 lb 9.6 oz (89.6 kg)   SpO2 98%   BMI 33.92 kg/m  General Appearance: Well nourished, in no apparent distress.  Eyes: PERRLA, EOMs, conjunctiva no swelling or erythema, normal fundi and vessels.  Sinuses: No Frontal/maxillary tenderness  ENT/Mouth: Ext aud canals clear, normal light reflex with TMs without erythema, bulging. Good dentition. No erythema, swelling, or exudate on  post pharynx. Tonsils not swollen or erythematous. Hearing normal.  Respiratory: Respiratory effort normal, BS equal bilaterally without rales, rhonchi, wheezing or stridor.  Cardio: RRR without murmurs, rubs or gallops. Brisk peripheral pulses without edema.  Chest: symmetric, with normal excursions and percussion.  Abdomen: Soft, nontender, no guarding, rebound, hernias, masses, or organomegaly.  Lymphatics: Non tender without lymphadenopathy.  Musculoskeletal: Full ROM all peripheral extremities,5/5 strength, and normal gait.  Skin: Warm, dry without rashes, lesions, ecchymosis. Neuro: Cranial nerves intact, reflexes equal bilaterally. Normal muscle tone, no cerebellar symptoms. Sensation intact.  Psych: Awake and oriented X 3, normal affect, Insight and Judgment appropriate.    Dowell Hoon E  9:24 AM [facility]

## 2022-04-17 ENCOUNTER — Encounter: Payer: Self-pay | Admitting: Nurse Practitioner

## 2022-04-17 ENCOUNTER — Ambulatory Visit (INDEPENDENT_AMBULATORY_CARE_PROVIDER_SITE_OTHER): Payer: BC Managed Care – PPO | Admitting: Nurse Practitioner

## 2022-04-17 VITALS — BP 120/74 | HR 79 | Temp 97.6°F | Resp 16 | Ht 64.0 in | Wt 197.6 lb

## 2022-04-17 DIAGNOSIS — E039 Hypothyroidism, unspecified: Secondary | ICD-10-CM | POA: Diagnosis not present

## 2022-04-17 DIAGNOSIS — E782 Mixed hyperlipidemia: Secondary | ICD-10-CM

## 2022-04-17 DIAGNOSIS — G629 Polyneuropathy, unspecified: Secondary | ICD-10-CM

## 2022-04-17 DIAGNOSIS — K219 Gastro-esophageal reflux disease without esophagitis: Secondary | ICD-10-CM | POA: Diagnosis not present

## 2022-04-17 DIAGNOSIS — F325 Major depressive disorder, single episode, in full remission: Secondary | ICD-10-CM

## 2022-04-17 DIAGNOSIS — E669 Obesity, unspecified: Secondary | ICD-10-CM

## 2022-04-17 DIAGNOSIS — K760 Fatty (change of) liver, not elsewhere classified: Secondary | ICD-10-CM

## 2022-04-17 DIAGNOSIS — E559 Vitamin D deficiency, unspecified: Secondary | ICD-10-CM

## 2022-04-17 DIAGNOSIS — Z79899 Other long term (current) drug therapy: Secondary | ICD-10-CM

## 2022-04-17 MED ORDER — GABAPENTIN 300 MG PO CAPS: 300.0000 mg | ORAL_CAPSULE | Freq: Three times a day (TID) | ORAL | 2 refills | Status: DC

## 2022-04-17 NOTE — Patient Instructions (Signed)
New dose of Gabapentin has been sent to the pharmacy for 300mg  to take 1 tab every 6 hours(three times a day)  If this does not help with pain then would like to refer back to neurology  Gabapentin Capsules or Tablets What is this medication? GABAPENTIN (GA ba pen tin) treats nerve pain. It may also be used to prevent and control seizures in people with epilepsy. It works by calming overactive nerves in your body. This medicine may be used for other purposes; ask your health care provider or pharmacist if you have questions. COMMON BRAND NAME(S): Active-PAC with Gabapentin, Orpha Bur, Gralise, Neurontin What should I tell my care team before I take this medication? They need to know if you have any of these conditions: Alcohol or substance use disorder Kidney disease Lung or breathing disease Suicidal thoughts, plans, or attempt; a previous suicide attempt by you or a family member An unusual or allergic reaction to gabapentin, other medications, foods, dyes, or preservatives Pregnant or trying to get pregnant Breast-feeding How should I use this medication? Take this medication by mouth with a glass of water. Follow the directions on the prescription label. You can take it with or without food. If it upsets your stomach, take it with food. Take your medication at regular intervals. Do not take it more often than directed. Do not stop taking except on your care team's advice. If you are directed to break the 600 or 800 mg tablets in half as part of your dose, the extra half tablet should be used for the next dose. If you have not used the extra half tablet within 28 days, it should be thrown away. A special MedGuide will be given to you by the pharmacist with each prescription and refill. Be sure to read this information carefully each time. Talk to your care team about the use of this medication in children. While this medication may be prescribed for children as young as 3 years for selected  conditions, precautions do apply. Overdosage: If you think you have taken too much of this medicine contact a poison control center or emergency room at once. NOTE: This medicine is only for you. Do not share this medicine with others. What if I miss a dose? If you miss a dose, take it as soon as you can. If it is almost time for your next dose, take only that dose. Do not take double or extra doses. What may interact with this medication? Alcohol Antihistamines for allergy, cough, and cold Certain medications for anxiety or sleep Certain medications for depression like amitriptyline, fluoxetine, sertraline Certain medications for seizures like phenobarbital, primidone Certain medications for stomach problems General anesthetics like halothane, isoflurane, methoxyflurane, propofol Local anesthetics like lidocaine, pramoxine, tetracaine Medications that relax muscles for surgery Opioid medications for pain Phenothiazines like chlorpromazine, mesoridazine, prochlorperazine, thioridazine This list may not describe all possible interactions. Give your health care provider a list of all the medicines, herbs, non-prescription drugs, or dietary supplements you use. Also tell them if you smoke, drink alcohol, or use illegal drugs. Some items may interact with your medicine. What should I watch for while using this medication? Visit your care team for regular checks on your progress. You may want to keep a record at home of how you feel your condition is responding to treatment. You may want to share this information with your care team at each visit. You should contact your care team if your seizures get worse or if you have any  new types of seizures. Do not stop taking this medication or any of your seizure medications unless instructed by your care team. Stopping your medication suddenly can increase your seizures or their severity. This medication may cause serious skin reactions. They can happen  weeks to months after starting the medication. Contact your care team right away if you notice fevers or flu-like symptoms with a rash. The rash may be red or purple and then turn into blisters or peeling of the skin. Or, you might notice a red rash with swelling of the face, lips or lymph nodes in your neck or under your arms. Wear a medical identification bracelet or chain if you are taking this medication for seizures. Carry a card that lists all your medications. This medication may affect your coordination, reaction time, or judgment. Do not drive or operate machinery until you know how this medication affects you. Sit up or stand slowly to reduce the risk of dizzy or fainting spells. Drinking alcohol with this medication can increase the risk of these side effects. Your mouth may get dry. Chewing sugarless gum or sucking hard candy, and drinking plenty of water may help. Watch for new or worsening thoughts of suicide or depression. This includes sudden changes in mood, behaviors, or thoughts. These changes can happen at any time but are more common in the beginning of treatment or after a change in dose. Call your care team right away if you experience these thoughts or worsening depression. If you become pregnant while using this medication, you may enroll in the [place] Pregnancy Registry by calling [phone]. This registry collects information about the safety of antiepileptic medication use during pregnancy. What side effects may I notice from receiving this medication? Side effects that you should report to your care team as soon as possible: Allergic reactions or angioedema--skin rash, itching, hives, swelling of the face, eyes, lips, tongue, arms, or legs, trouble swallowing or breathing Rash, fever, and swollen lymph nodes Thoughts of suicide or self harm, worsening mood, feelings of depression Trouble breathing Unusual changes in mood or behavior in children  after use such as difficulty concentrating, hostility, or restlessness Side effects that usually do not require medical attention (report to your care team if they continue or are bothersome): Dizziness Drowsiness Nausea Swelling of ankles, feet, or hands Vomiting This list may not describe all possible side effects. Call your doctor for medical advice about side effects. You may report side effects to FDA at 1-800-FDA-1088. Where should I keep my medication? Keep out of reach of children and pets. Store at room temperature between 15 and 30 degrees C (59 and 86 degrees F). Get rid of any unused medication after the expiration date. This medication may cause accidental overdose and death if taken by other adults, children, or pets. To get rid of medications that are no longer needed or have expired: Take the medication to a medication take-back program. Check with your pharmacy or law enforcement to find a location. If you cannot return the medication, check the label or package insert to see if the medication should be thrown out in the garbage or flushed down the toilet. If you are not sure, ask your care team. If it is safe to put it in the trash, empty the medication out of the container. Mix the medication with cat litter, dirt, coffee grounds, or other unwanted substance. Seal the mixture in a bag or container. Put it in the trash. NOTE: This sheet  is a summary. It may not cover all possible information. If you have questions about this medicine, talk to your doctor, pharmacist, or health care provider.  2023 Elsevier/Gold Standard (2020-08-29 00:00:00)

## 2022-04-18 LAB — COMPLETE METABOLIC PANEL WITH GFR
AG Ratio: 1.7 (calc) (ref 1.0–2.5)
ALT: 37 U/L — ABNORMAL HIGH (ref 6–29)
AST: 25 U/L (ref 10–35)
Albumin: 4.7 g/dL (ref 3.6–5.1)
Alkaline phosphatase (APISO): 71 U/L (ref 37–153)
BUN: 16 mg/dL (ref 7–25)
CO2: 27 mmol/L (ref 20–32)
Calcium: 10.1 mg/dL (ref 8.6–10.4)
Chloride: 106 mmol/L (ref 98–110)
Creat: 0.7 mg/dL (ref 0.50–1.05)
Globulin: 2.8 g/dL (calc) (ref 1.9–3.7)
Glucose, Bld: 104 mg/dL — ABNORMAL HIGH (ref 65–99)
Potassium: 4.4 mmol/L (ref 3.5–5.3)
Sodium: 141 mmol/L (ref 135–146)
Total Bilirubin: 0.6 mg/dL (ref 0.2–1.2)
Total Protein: 7.5 g/dL (ref 6.1–8.1)
eGFR: 99 mL/min/{1.73_m2} (ref >=60)

## 2022-04-18 LAB — LIPID PANEL
Cholesterol: 177 mg/dL (ref <200)
HDL: 50 mg/dL (ref >=50)
LDL Cholesterol (Calc): 104 mg/dL (calc) — ABNORMAL HIGH
Non-HDL Cholesterol (Calc): 127 mg/dL (calc) (ref <130)
Total CHOL/HDL Ratio: 3.5 (calc) (ref <5.0)
Triglycerides: 136 mg/dL (ref <150)

## 2022-04-18 LAB — CBC WITH DIFFERENTIAL/PLATELET
Absolute Monocytes: 279 cells/uL (ref 200–950)
Basophils Absolute: 31 cells/uL (ref 0–200)
Basophils Relative: 0.5 %
Eosinophils Absolute: 118 cells/uL (ref 15–500)
Eosinophils Relative: 1.9 %
HCT: 39.7 % (ref 35.0–45.0)
Hemoglobin: 13.7 g/dL (ref 11.7–15.5)
Lymphs Abs: 2294 cells/uL (ref 850–3900)
MCH: 30.9 pg (ref 27.0–33.0)
MCHC: 34.5 g/dL (ref 32.0–36.0)
MCV: 89.4 fL (ref 80.0–100.0)
MPV: 11.5 fL (ref 7.5–12.5)
Monocytes Relative: 4.5 %
Neutro Abs: 3478 cells/uL (ref 1500–7800)
Neutrophils Relative %: 56.1 %
Platelets: 199 10*3/uL (ref 140–400)
RBC: 4.44 10*6/uL (ref 3.80–5.10)
RDW: 11.7 % (ref 11.0–15.0)
Total Lymphocyte: 37 %
WBC: 6.2 10*3/uL (ref 3.8–10.8)

## 2022-04-18 LAB — TSH: TSH: 0.44 mIU/L (ref 0.40–4.50)

## 2022-05-06 ENCOUNTER — Other Ambulatory Visit: Payer: Self-pay | Admitting: Nurse Practitioner

## 2022-05-06 DIAGNOSIS — E782 Mixed hyperlipidemia: Secondary | ICD-10-CM

## 2022-07-22 ENCOUNTER — Other Ambulatory Visit: Payer: Self-pay | Admitting: Nurse Practitioner

## 2022-07-22 DIAGNOSIS — E782 Mixed hyperlipidemia: Secondary | ICD-10-CM

## 2022-07-22 DIAGNOSIS — G629 Polyneuropathy, unspecified: Secondary | ICD-10-CM

## 2022-07-30 ENCOUNTER — Other Ambulatory Visit: Payer: Self-pay

## 2022-07-30 DIAGNOSIS — E039 Hypothyroidism, unspecified: Secondary | ICD-10-CM

## 2022-07-30 MED ORDER — LEVOTHYROXINE SODIUM 125 MCG PO TABS: ORAL_TABLET | ORAL | 1 refills | Status: DC

## 2022-08-01 ENCOUNTER — Other Ambulatory Visit: Payer: Self-pay | Admitting: Nurse Practitioner

## 2022-08-01 DIAGNOSIS — E782 Mixed hyperlipidemia: Secondary | ICD-10-CM

## 2022-08-16 ENCOUNTER — Ambulatory Visit: Payer: BC Managed Care – PPO | Admitting: Nurse Practitioner

## 2022-08-27 NOTE — Progress Notes (Deleted)
 4 MONTH FOLLOW UP  Assessment and Plan:    Mixed hyperlipidemia -     CBC with Differential/Platelet -     COMPLETE METABOLIC PANEL WITH GFR -     Lipid panel  Continue diet and exercise, Rosuvastatin 5 mg QD  Hypothyroidism, unspecified type -     TSH - Continue current dose of levothyroxine, will adjust if needed pending lab results  Gastroesophageal reflux disease, unspecified whether esophagitis present  Continue Pepto or maalox as needed and behavior modifications  Abnormal glucose Continue diet and exercise  Obesity (BMI 30.0-34.9) Long discussion about weight loss, diet, and exercise Recommended diet heavy in fruits and veggies and low in animal meats, cheeses, and dairy products, appropriate calorie intake Patient will work on decreasing saturated fats, simple carbs- increase activity Follow up at next visit   Vitamin D deficiency  Continue Vit D supplementation  Medication management  Magnesium  Depression, major, in remission (HCC)  Continue behavior modifications, practice good sleep hygiene  Currently not interested in medication   Neuropathy - Will increase Gabapentin to 300 mg 1 tab PPO TID - Monitor and if symptoms are not improving will send back to neurology for further evaluation- could not afford previous MRI that was recommended      Discussed med's effects and SE's. Screening labs and tests as requested with regular follow-up as recommended.  Future Appointments  Date Time Provider Department Center  08/28/2022 10:45 AM [name], NP [facility] None  01/10/2023  9:00 AM [name], NP [facility] None     HPI  62 y.o. female. 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    Depression is currently well controlled without use of medication, tries to stay active.   She was evaluated at neurology for back and leg pain.  They did want to do a MRI but could not afford. She does continue to have the pain. Descfribes as an electrical pain that is worse at night.  She is on Gabapentin 100 mg 3 tabs BID with no relief.   BMI is There is no height or weight on file to calculate BMI., she has been working on diet and exercise. She has been cutting back on sodas.   Does yard work.  Wt Readings from Last 3 Encounters:  04/17/22 197 lb 9.6 oz (89.6 kg)  01/09/22 197 lb 3.2 oz (89.4 kg)  06/09/21 203 lb 3.2 oz (92.2 kg)   Today their BP is   BP Readings from Last 3 Encounters:  04/17/22 120/74  01/09/22 124/76  06/09/21 132/78   She does workout. She denies chest pain, shortness of breath, dizziness.    She is on cholesterol medication, Rosuvastatin 5 mg daily,  and denies myalgias. Her cholesterol is not at goal. The cholesterol last visit was:   Lab Results  Component Value Date   CHOL 177 04/17/2022   HDL 50 04/17/2022   LDLCALC 104 (H) 04/17/2022   TRIG 136 04/17/2022   CHOLHDL 3.5 04/17/2022   She has been working on diet and exercise for prediabetes, she is not on bASA, she is not on ACE/ARB (BP controlled) and denies foot ulcerations, increased appetite, nausea, paresthesia of the feet, polydipsia, polyuria, visual disturbances, vomiting and weight  loss. Last A1C in the office was:  Lab Results  Component Value Date   HGBA1C 6.0 (H) 01/09/2022   She is on thyroid medication qd 1 tab daily except M-W-F take 1.5 tabs daily Lab Results  Component Value Date   TSH 0.44 04/17/2022   Last GFR: Lab Results  Component Value Date   EGFR 99 04/17/2022    Patient is on Vitamin D supplement, she is on 5000 IU every day.    Lab Results  Component Value Date    VD25OH 32 01/09/2022        Current Medications:  Current Outpatient Medications on File Prior to Visit  Medication Sig Dispense Refill   Calcium Carbonate (CALCIUM 600 PO) Take by mouth.     Cholecalciferol (VITAMIN D PO) Take 5,000 Int'l Units by mouth daily.     gabapentin (NEURONTIN) 300 MG capsule TAKE 1 CAPSULE BY MOUTH THREE TIMES DAILY 90 capsule 0   levothyroxine (SYNTHROID) 125 MCG tablet Take  1 tablet  Daily except Monday, Wednesday , Friday take 1 and 1/2 pill  on an empty stomach with only water for 30 minutes & no Antacid meds, Calcium or Magnesium for 4 hours & avoid Biotin. 108 tablet 1   MAGNESIUM PO Take by mouth.     OVER THE COUNTER MEDICATION daily. Potassium- pt unsure of dose     rosuvastatin (CRESTOR) 5 MG tablet Take 1 tablet by mouth once daily 90 tablet 0   No current facility-administered medications on file prior to visit.   Allergies:  Allergies  Allergen Reactions   Atorvastatin     Fatigue    Medical History:  She has Abnormal glucose (prediabetes); Hyperlipidemia; Vitamin D deficiency; Hypothyroidism; Depression, major, in remission (HCC); GERD (gastroesophageal reflux disease); Medication management; Obesity (BMI 30.0-34.9); Insomnia; Fatty liver; and B12 deficiency on their problem list.    Patient Care Team: Lucky Cowboy, MD as PCP - General (Internal Medicine) Dorisann Frames, MD as Consulting Physician (Endocrinology)  Surgical History:  She has a past surgical history that includes thyroid ablation (2008). Family History:  Herfamily history includes Aneurysm in her brother and sister; Arthritis in her sister; Breast cancer in her mother; Heart attack (age of onset: 59) in her paternal grandfather; Hyperlipidemia in her father and mother; Hypertension in her mother. Mother with pacemaker.  Social History:  She reports that she has never smoked. She has never used smokeless tobacco. She reports current alcohol use. She reports that she  does not use drugs.  Review of Systems: Review of Systems  Constitutional:  Negative for chills, fever, malaise/fatigue and weight loss.  HENT:  Negative for congestion, hearing loss, sinus pain, sore throat and tinnitus.   Eyes:  Negative for blurred vision and double vision.  Respiratory:  Negative for cough, hemoptysis, sputum production, shortness of breath and wheezing.   Cardiovascular:  Negative for chest pain, palpitations, orthopnea, claudication, leg swelling and PND.  Gastrointestinal:  Negative for abdominal pain, blood in stool, constipation, diarrhea, heartburn, melena, nausea and vomiting.  Genitourinary: Negative.  Negative for dysuria and urgency.  Musculoskeletal:  Positive for back pain (tingling sensation that radiates to legs). Negative for falls, joint pain, myalgias and neck pain.  Skin:  Negative for rash.  Neurological:  Positive for tingling (legs and back). Negative for dizziness, tremors, sensory change, weakness and headaches.  Endo/Heme/Allergies:  Negative for polydipsia. Does not bruise/bleed easily.  Psychiatric/Behavioral:  Negative for memory loss, substance abuse and suicidal ideas. The patient  has insomnia (uses melatonin as needed, wakes frequently).   All other systems reviewed and are negative.   Physical Exam: Estimated body mass index is 33.92 kg/m as calculated from the following:   Height as of 04/17/22: 5\' 4"  (1.626 m).   Weight as of 04/17/22: 197 lb 9.6 oz (89.6 kg). There were no vitals taken for this visit. General Appearance: Well nourished, in no apparent distress.  Eyes: PERRLA, EOMs, conjunctiva no swelling or erythema, normal fundi and vessels.  Sinuses: No Frontal/maxillary tenderness  ENT/Mouth: Ext aud canals clear, normal light reflex with TMs without erythema, bulging. Good dentition. No erythema, swelling, or exudate on post pharynx. Tonsils not swollen or erythematous. Hearing normal.  Respiratory: Respiratory effort normal, BS  equal bilaterally without rales, rhonchi, wheezing or stridor.  Cardio: RRR without murmurs, rubs or gallops. Brisk peripheral pulses without edema.  Chest: symmetric, with normal excursions and percussion.  Abdomen: Soft, nontender, no guarding, rebound, hernias, masses, or organomegaly.  Lymphatics: Non tender without lymphadenopathy.  Musculoskeletal: Full ROM all peripheral extremities,5/5 strength, and normal gait.  Skin: Warm, dry without rashes, lesions, ecchymosis. Neuro: Cranial nerves intact, reflexes equal bilaterally. Normal muscle tone, no cerebellar symptoms. Sensation intact.  Psych: Awake and oriented X 3, normal affect, Insight and Judgment appropriate.    Vlad Mayberry E  11:42 AM [facility]

## 2022-08-28 ENCOUNTER — Other Ambulatory Visit: Payer: Self-pay | Admitting: Nurse Practitioner

## 2022-08-28 ENCOUNTER — Ambulatory Visit: Payer: BC Managed Care – PPO | Admitting: Nurse Practitioner

## 2022-08-28 DIAGNOSIS — E559 Vitamin D deficiency, unspecified: Secondary | ICD-10-CM

## 2022-08-28 DIAGNOSIS — E039 Hypothyroidism, unspecified: Secondary | ICD-10-CM

## 2022-08-28 DIAGNOSIS — R7309 Other abnormal glucose: Secondary | ICD-10-CM

## 2022-08-28 DIAGNOSIS — E782 Mixed hyperlipidemia: Secondary | ICD-10-CM

## 2022-08-28 DIAGNOSIS — G629 Polyneuropathy, unspecified: Secondary | ICD-10-CM

## 2022-08-28 DIAGNOSIS — F325 Major depressive disorder, single episode, in full remission: Secondary | ICD-10-CM

## 2022-08-28 DIAGNOSIS — Z79899 Other long term (current) drug therapy: Secondary | ICD-10-CM

## 2022-08-28 DIAGNOSIS — K219 Gastro-esophageal reflux disease without esophagitis: Secondary | ICD-10-CM

## 2022-08-28 DIAGNOSIS — E669 Obesity, unspecified: Secondary | ICD-10-CM

## 2022-09-06 IMAGING — CR DG LUMBAR SPINE COMPLETE 4+V
5 series · 5 of 5 positions shown · non-contrast
Comparison: None.

CLINICAL DATA: Back pain.

EXAM:
LUMBAR SPINE - COMPLETE 4+ VIEW

[w lumbar spine ap]
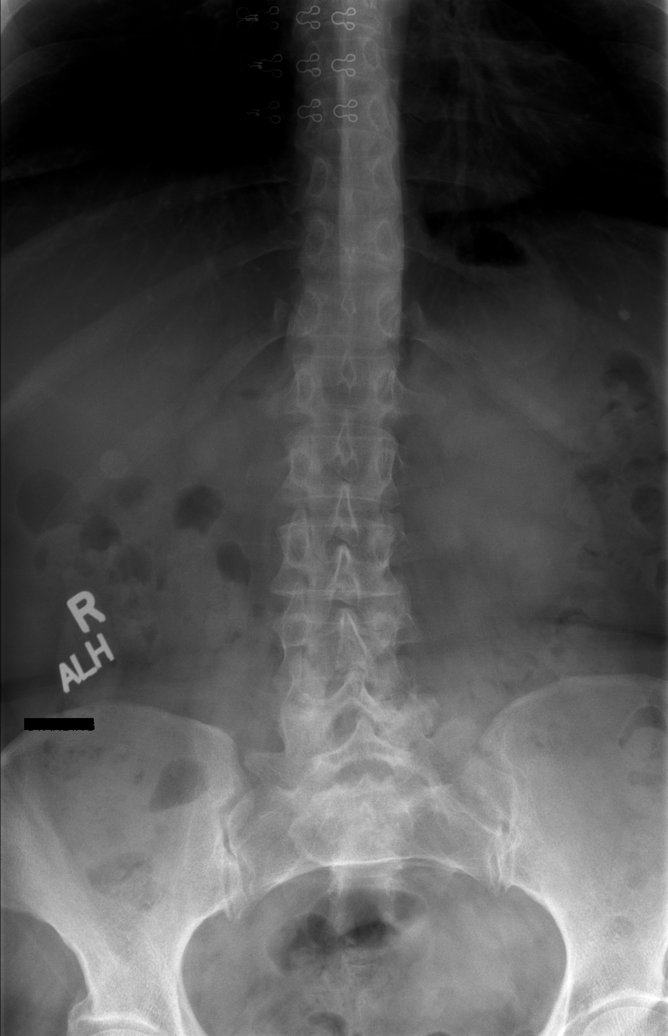

[w lumbar spine obl (1 of 2)]
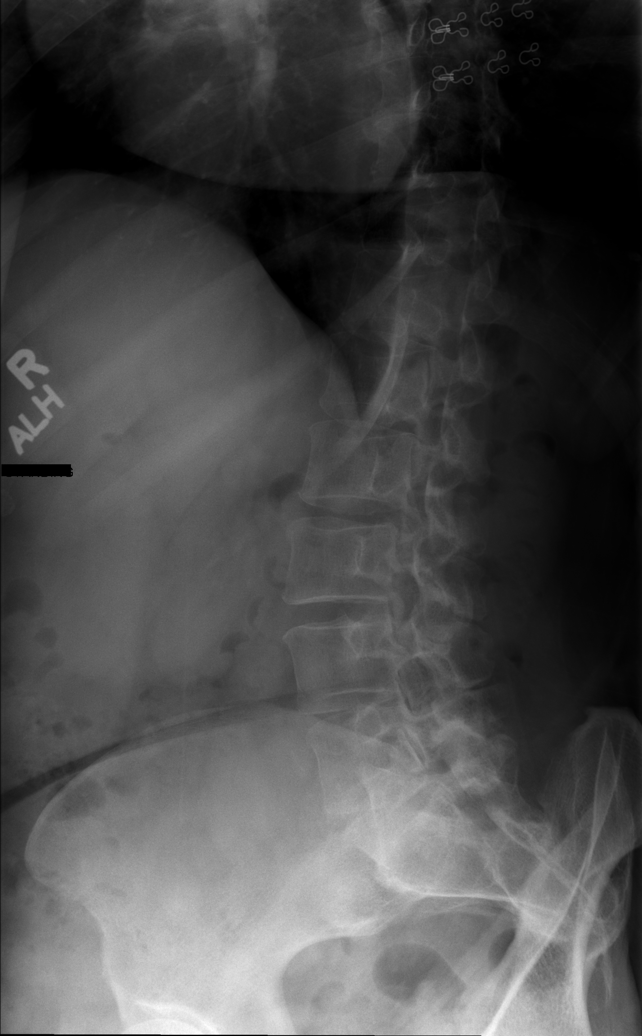

[w lumbar spine obl (2 of 2)]
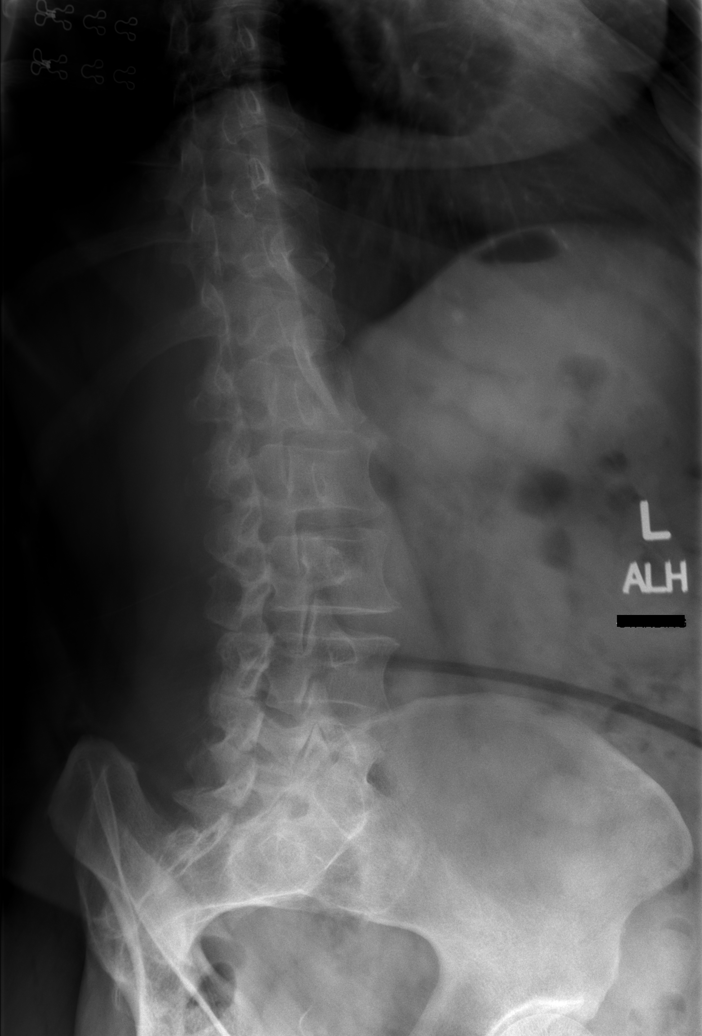

[w lumbar spine lat]
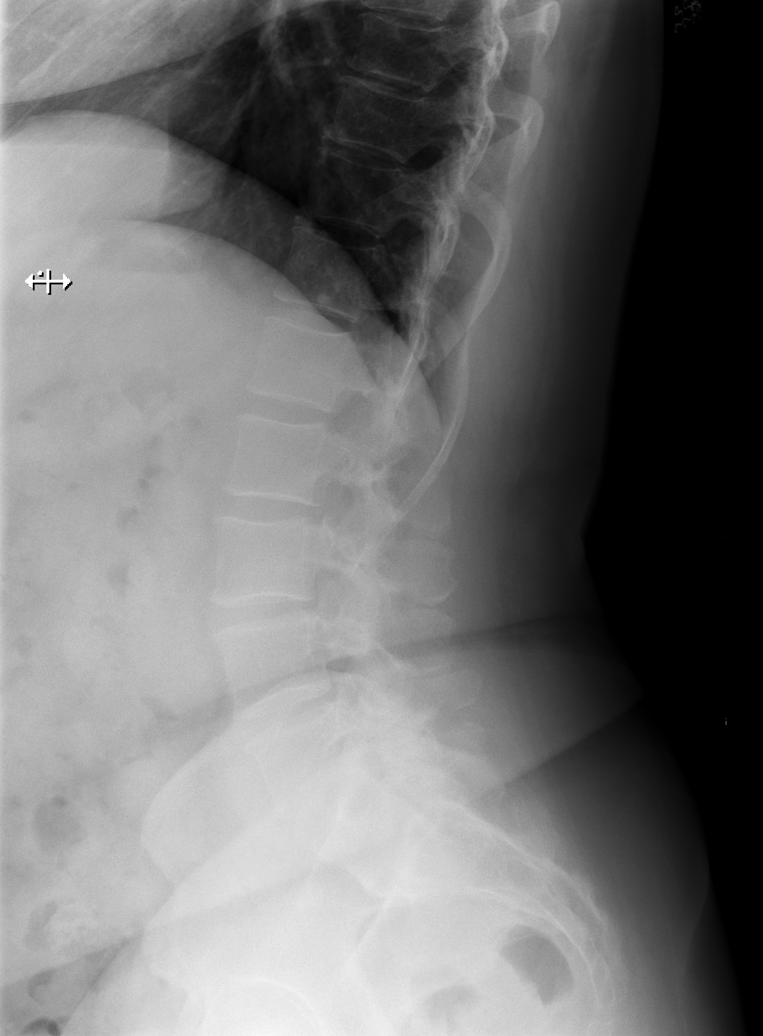

[w lumbar l-5 s-1 spot]
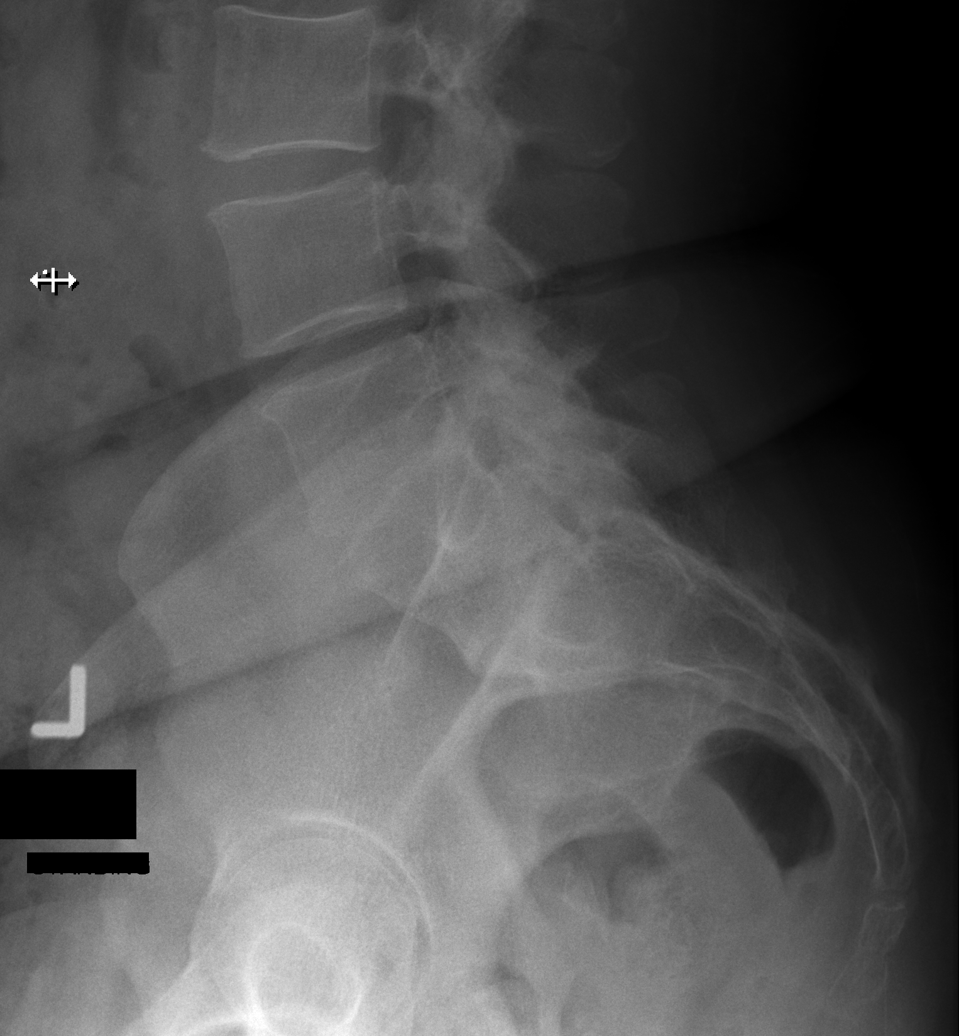

[5 of 5 positions shown; findings below may reference images not displayed]

FINDINGS: Five lumbar type vertebra. There is no acute fracture or subluxation
of the lumbar spine. The vertebral body heights and disc spaces are
maintained. The visualized posterior elements are intact. A 1 cm
radiopaque focus over the right flank may represent a gallstone. The
soft tissues are unremarkable.
IMPRESSION: 1. No acute/traumatic lumbar spine pathology.
2. Probable gallstone.

## 2022-09-25 ENCOUNTER — Other Ambulatory Visit: Payer: Self-pay | Admitting: Nurse Practitioner

## 2022-09-25 DIAGNOSIS — G629 Polyneuropathy, unspecified: Secondary | ICD-10-CM

## 2022-10-22 ENCOUNTER — Other Ambulatory Visit: Payer: Self-pay | Admitting: Nurse Practitioner

## 2022-10-22 DIAGNOSIS — E782 Mixed hyperlipidemia: Secondary | ICD-10-CM

## 2022-10-23 ENCOUNTER — Other Ambulatory Visit: Payer: Self-pay | Admitting: Nurse Practitioner

## 2022-10-23 DIAGNOSIS — G629 Polyneuropathy, unspecified: Secondary | ICD-10-CM

## 2022-11-13 ENCOUNTER — Ambulatory Visit: Payer: BC Managed Care – PPO | Admitting: Nurse Practitioner

## 2022-11-13 ENCOUNTER — Encounter: Payer: Self-pay | Admitting: Nurse Practitioner

## 2022-11-13 VITALS — BP 118/72 | HR 77 | Temp 97.7°F | Ht 64.0 in | Wt 203.6 lb

## 2022-11-13 DIAGNOSIS — E782 Mixed hyperlipidemia: Secondary | ICD-10-CM

## 2022-11-13 DIAGNOSIS — E669 Obesity, unspecified: Secondary | ICD-10-CM | POA: Diagnosis not present

## 2022-11-13 DIAGNOSIS — K219 Gastro-esophageal reflux disease without esophagitis: Secondary | ICD-10-CM | POA: Diagnosis not present

## 2022-11-13 DIAGNOSIS — Z79899 Other long term (current) drug therapy: Secondary | ICD-10-CM

## 2022-11-13 DIAGNOSIS — K760 Fatty (change of) liver, not elsewhere classified: Secondary | ICD-10-CM

## 2022-11-13 DIAGNOSIS — E559 Vitamin D deficiency, unspecified: Secondary | ICD-10-CM

## 2022-11-13 DIAGNOSIS — G629 Polyneuropathy, unspecified: Secondary | ICD-10-CM

## 2022-11-13 DIAGNOSIS — E039 Hypothyroidism, unspecified: Secondary | ICD-10-CM

## 2022-11-13 DIAGNOSIS — F325 Major depressive disorder, single episode, in full remission: Secondary | ICD-10-CM

## 2022-11-13 NOTE — Patient Instructions (Signed)

## 2022-11-13 NOTE — Progress Notes (Signed)
 4 MONTH FOLLOW UP  Assessment and Plan:   Mixed hyperlipidemia -     CBC with Differential/Platelet -     COMPLETE METABOLIC PANEL WITH GFR -     Lipid panel  Continue diet and exercise, Rosuvastatin 5 mg QD  Hypothyroidism, unspecified type -     TSH - Continue current dose of levothyroxine, will adjust if needed pending lab results  Gastroesophageal reflux disease, unspecified whether esophagitis present  Continue Pepto or maalox as needed and behavior modifications  Abnormal glucose Continue diet and exercise  Obesity (BMI 30.0-34.9) Long discussion about weight loss, diet, and exercise Recommended diet heavy in fruits and veggies and low in animal meats, cheeses, and dairy products, appropriate calorie intake Patient will work on decreasing saturated fats, simple carbs- increase activity Follow up at next visit  Vitamin D deficiency  Continue Vit D supplementation    Depression, major, in remission (HCC)  Continue behavior modifications, practice good sleep hygiene  Currently not interested in medication  Neuropathy - Continue Gabapentin to 300 mg 1 tab PO TID - Continue to monitor symptoms  Fatty Liver Mild elevation of one of your liver enzymes, work on diet, exercise and weight loss.  Limit Tylenol and alcohol.   - CMP   Medication management -     CBC with Differential/Platelet -     COMPLETE METABOLIC PANEL WITH GFR -     Lipid panel -     TSH      Discussed med's effects and SE's. Screening labs and tests as requested with regular follow-up as recommended.  Future Appointments  Date Time Provider Department Center  02/12/2023 10:00 AM Robin Dick, NP [facility] None     HPI  62 y.o. female. 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    Depression is currently well controlled without use of medication, tries to stay active.   She went out to eat yesterday and approx 90 minutes later she started having nausea, vomiting and diarrhea last night .  Denies vomiting today and diarrhea, still a little bit of nausea.  She was evaluated at neurology for back and leg pain.  They did want to do a MRI but could not afford. She does continue to have the pain. Descfribes as an electrical pain that is worse at night.  She is on Gabapentin 100 mg 3 tabs TID with relief  BMI is Body mass index is 34.95 kg/m., she has been working on diet and exercise. She has been cutting back on sodas.   Does yard work.  Wt Readings from Last 3 Encounters:  11/13/22 203 lb 9.6 oz (92.4 kg)  04/17/22 197 lb 9.6 oz (89.6 kg)  01/09/22 197 lb 3.2 oz (89.4 kg)   Today their BP is BP: 118/72 BP Readings from Last 3 Encounters:  11/13/22 118/72  04/17/22 120/74  01/09/22 124/76   She does workout. She denies chest pain, shortness of breath, dizziness.    She is on cholesterol medication, Rosuvastatin 5 mg daily,  and denies myalgias. Her cholesterol is not at goal. The cholesterol last visit was:   Lab Results  Component Value Date   CHOL 177 04/17/2022   HDL 50 04/17/2022   LDLCALC 104 (H) 04/17/2022  TRIG 136 04/17/2022   CHOLHDL 3.5 04/17/2022   She has been working on diet and exercise for prediabetes, she is not on bASA, she is not on ACE/ARB (BP controlled) and denies foot ulcerations, increased appetite, nausea, paresthesia of the feet, polydipsia, polyuria, visual disturbances, vomiting and weight loss. Last A1C in the office was:  Lab Results  Component Value Date   HGBA1C 6.0 (H) 01/09/2022   She is on thyroid medication qd 1 tab daily except M-W-F take 1.5 tabs daily. Sometimes  forgets to take the 1/2 pill of thyroid Lab Results  Component Value Date   TSH 0.44 04/17/2022   Last GFR: Lab Results  Component Value Date   EGFR 99 04/17/2022    Patient is on Vitamin D supplement, she is on 5000 IU every day.    Lab Results  Component Value Date   VD25OH 32 01/09/2022        Current Medications:  Current Outpatient Medications on File Prior to Visit  Medication Sig Dispense Refill   Calcium Carbonate (CALCIUM 600 PO) Take by mouth.     Cholecalciferol (VITAMIN D PO) Take 5,000 Int'l Units by mouth daily.     gabapentin (NEURONTIN) 300 MG capsule TAKE 1 CAPSULE BY MOUTH THREE TIMES DAILY 90 capsule 0   levothyroxine (SYNTHROID) 125 MCG tablet Take  1 tablet  Daily except Monday, Wednesday , Friday take 1 and 1/2 pill  on an empty stomach with only water for 30 minutes & no Antacid meds, Calcium or Magnesium for 4 hours & avoid Biotin. 108 tablet 1   MAGNESIUM PO Take by mouth.     OVER THE COUNTER MEDICATION daily. Potassium- pt unsure of dose     rosuvastatin (CRESTOR) 5 MG tablet Take 1 tablet by mouth once daily 90 tablet 0   No current facility-administered medications on file prior to visit.   Allergies:  Allergies  Allergen Reactions   Atorvastatin     Fatigue    Medical History:  She has Abnormal glucose (prediabetes); Hyperlipidemia; Vitamin D deficiency; Hypothyroidism; Depression, major, in remission (HCC); GERD (gastroesophageal reflux disease); Medication management; Obesity (BMI 30.0-34.9); Insomnia; Fatty liver; and B12 deficiency on their problem list.    Patient Care Team: Lucky Cowboy, MD as PCP - General (Internal Medicine) Dorisann Frames, MD as Consulting Physician (Endocrinology)  Surgical History:  She has a past surgical history that includes thyroid ablation (2008). Family History:  Herfamily history includes Aneurysm in her brother and sister; Arthritis in her sister; Breast cancer in her mother; Heart attack (age of  onset: 58) in her paternal grandfather; Hyperlipidemia in her father and mother; Hypertension in her mother. Mother with pacemaker.  Social History:  She reports that she has never smoked. She has never used smokeless tobacco. She reports current alcohol use. She reports that she does not use drugs.  Review of Systems: Review of Systems  Constitutional:  Negative for chills, fever, malaise/fatigue and weight loss.  HENT:  Negative for congestion, hearing loss, sinus pain, sore throat and tinnitus.   Eyes:  Negative for blurred vision and double vision.  Respiratory:  Negative for cough, hemoptysis, sputum production, shortness of breath and wheezing.   Cardiovascular:  Negative for chest pain, palpitations, orthopnea, claudication, leg swelling and PND.  Gastrointestinal:  Negative for abdominal pain, blood in stool, constipation, diarrhea, heartburn, melena, nausea and vomiting.  Genitourinary: Negative.  Negative for dysuria and urgency.  Musculoskeletal:  Positive for back pain (tingling sensation that radiates  to legs). Negative for falls, joint pain, myalgias and neck pain.  Skin:  Negative for rash.  Neurological:  Negative for dizziness, tingling, tremors, sensory change, weakness and headaches.  Endo/Heme/Allergies:  Negative for polydipsia. Does not bruise/bleed easily.  Psychiatric/Behavioral:  Negative for memory loss, substance abuse and suicidal ideas. The patient has insomnia (uses melatonin as needed, wakes frequently).   All other systems reviewed and are negative.   Physical Exam: Estimated body mass index is 34.95 kg/m as calculated from the following:   Height as of this encounter: 5\' 4"  (1.626 m).   Weight as of this encounter: 203 lb 9.6 oz (92.4 kg). BP 118/72   Pulse 77   Temp 97.7 F (36.5 C)   Ht 5\' 4"  (1.626 m)   Wt 203 lb 9.6 oz (92.4 kg)   SpO2 96%   BMI 34.95 kg/m  General Appearance: Well nourished, in no apparent distress.  Eyes: PERRLA, EOMs,  conjunctiva no swelling or erythema, normal fundi and vessels.  Sinuses: No Frontal/maxillary tenderness  ENT/Mouth: Ext aud canals clear, normal light reflex with TMs without erythema, bulging. Good dentition. No erythema, swelling, or exudate on post pharynx. Tonsils not swollen or erythematous. Hearing normal.  Respiratory: Respiratory effort normal, BS equal bilaterally without rales, rhonchi, wheezing or stridor.  Cardio: RRR without murmurs, rubs or gallops. Brisk peripheral pulses without edema.  Chest: symmetric, with normal excursions and percussion.  Abdomen: Soft, nontender, no guarding, rebound, hernias, masses, or organomegaly.  Lymphatics: Non tender without lymphadenopathy.  Musculoskeletal: Full ROM all peripheral extremities,5/5 strength, and normal gait.  Skin: Warm, dry without rashes, lesions, ecchymosis. Neuro: Cranial nerves intact, reflexes equal bilaterally. Normal muscle tone, no cerebellar symptoms. Sensation intact.  Psych: Awake and oriented X 3, normal affect, Insight and Judgment appropriate.    Robin Montes  10:58 AM [facility]

## 2022-11-14 LAB — COMPLETE METABOLIC PANEL WITH GFR
AG Ratio: 1.9 (calc) (ref 1.0–2.5)
ALT: 38 U/L — ABNORMAL HIGH (ref 6–29)
AST: 28 U/L (ref 10–35)
Albumin: 4.6 g/dL (ref 3.6–5.1)
Alkaline phosphatase (APISO): 71 U/L (ref 37–153)
BUN: 18 mg/dL (ref 7–25)
CO2: 24 mmol/L (ref 20–32)
Calcium: 9.2 mg/dL (ref 8.6–10.4)
Chloride: 108 mmol/L (ref 98–110)
Creat: 0.74 mg/dL (ref 0.50–1.05)
Globulin: 2.4 g/dL (ref 1.9–3.7)
Glucose, Bld: 103 mg/dL — ABNORMAL HIGH (ref 65–99)
Potassium: 4.3 mmol/L (ref 3.5–5.3)
Sodium: 141 mmol/L (ref 135–146)
Total Bilirubin: 0.4 mg/dL (ref 0.2–1.2)
Total Protein: 7 g/dL (ref 6.1–8.1)
eGFR: 93 mL/min/{1.73_m2} (ref >=60)

## 2022-11-14 LAB — LIPID PANEL
Cholesterol: 179 mg/dL (ref <200)
HDL: 46 mg/dL — ABNORMAL LOW (ref >=50)
LDL Cholesterol (Calc): 115 mg/dL — ABNORMAL HIGH
Non-HDL Cholesterol (Calc): 133 mg/dL — ABNORMAL HIGH (ref <130)
Total CHOL/HDL Ratio: 3.9 (calc) (ref <5.0)
Triglycerides: 82 mg/dL (ref <150)

## 2022-11-14 LAB — CBC WITH DIFFERENTIAL/PLATELET
Absolute Monocytes: 297 {cells}/uL (ref 200–950)
Basophils Absolute: 32 {cells}/uL (ref 0–200)
Basophils Relative: 0.6 %
Eosinophils Absolute: 69 {cells}/uL (ref 15–500)
Eosinophils Relative: 1.3 %
HCT: 38.2 % (ref 35.0–45.0)
Hemoglobin: 13 g/dL (ref 11.7–15.5)
Lymphs Abs: 2014 {cells}/uL (ref 850–3900)
MCH: 31.3 pg (ref 27.0–33.0)
MCHC: 34 g/dL (ref 32.0–36.0)
MCV: 92 fL (ref 80.0–100.0)
MPV: 11.6 fL (ref 7.5–12.5)
Monocytes Relative: 5.6 %
Neutro Abs: 2889 {cells}/uL (ref 1500–7800)
Neutrophils Relative %: 54.5 %
Platelets: 196 10*3/uL (ref 140–400)
RBC: 4.15 10*6/uL (ref 3.80–5.10)
RDW: 12.1 % (ref 11.0–15.0)
Total Lymphocyte: 38 %
WBC: 5.3 10*3/uL (ref 3.8–10.8)

## 2022-11-14 LAB — TSH: TSH: 5.28 m[IU]/L — ABNORMAL HIGH (ref 0.40–4.50)

## 2022-12-02 ENCOUNTER — Other Ambulatory Visit: Payer: Self-pay | Admitting: Nurse Practitioner

## 2022-12-02 DIAGNOSIS — G629 Polyneuropathy, unspecified: Secondary | ICD-10-CM

## 2023-01-10 ENCOUNTER — Encounter: Payer: BC Managed Care – PPO | Admitting: Nurse Practitioner

## 2023-01-26 ENCOUNTER — Other Ambulatory Visit: Payer: Self-pay | Admitting: Nurse Practitioner

## 2023-01-26 DIAGNOSIS — E782 Mixed hyperlipidemia: Secondary | ICD-10-CM

## 2023-01-29 ENCOUNTER — Telehealth: Payer: Self-pay | Admitting: Nurse Practitioner

## 2023-01-29 ENCOUNTER — Other Ambulatory Visit: Payer: Self-pay | Admitting: Nurse Practitioner

## 2023-01-29 DIAGNOSIS — G629 Polyneuropathy, unspecified: Secondary | ICD-10-CM

## 2023-01-29 MED ORDER — GABAPENTIN 300 MG PO CAPS: 300.0000 mg | ORAL_CAPSULE | Freq: Three times a day (TID) | ORAL | 3 refills | Status: DC

## 2023-01-29 NOTE — Addendum Note (Signed)
Addended by: Dionicio Stall on: 01/29/2023 02:21 PM   Modules accepted: Orders

## 2023-01-29 NOTE — Telephone Encounter (Signed)
Requesting refill on gabapentin. Please send to [facility] [address], [place] - [address]

## 2023-02-11 NOTE — Progress Notes (Unsigned)
 COMPLETE PHYSICAL  Assessment and Plan:   Robin Montes was seen today for annual exam.  Diagnoses and all orders for this visit:  Encounter for general adult medical examination with abnormal findings  Due Annually  Pt wants to defer mammogram until new insurance kicks in  Mixed hyperlipidemia -     CBC with Differential/Platelet -     COMPLETE METABOLIC PANEL WITH GFR -     Lipid panel  Continue diet and exercise  Hypothyroidism, unspecified type -     TSH - Continue current dose of levothyroxine, will adjust if needed pending lab results  Gastroesophageal reflux disease, unspecified whether esophagitis present  Continue Pepto or maalox as needed and behavior modifications -     Magnesium  Abnormal glucose -     Hemoglobin A1c Counseled at length on diet and exercise  Obesity (BMI 30.0-34.9)  Counseled at length on diet and exercise  Will stop sweet sodas . Increase fresh fruits/vegetables and fiber  Vitamin D deficiency -     VITAMIN D 25 Hydroxy (Vit-D Deficiency, Fractures)  Continue Vit D supplementation  Medication management  Magnesium  Depression, major, in remission (HCC)  Continue behavior modifications, practice good sleep hygiene  Currently not interested in medication  Insomnia, unspecified type Practice good sleep hygiene and melatonin PRN  Screening for hematuria or proteinuria -     Urinalysis, Routine w reflex microscopic -     Microalbumin / creatinine urine ratio  Screening for ischemic heart disease -     EKG 12-Lead  Screening for AAA -ABD U/S LTD retroperitoneal  Neuropathy - Will try Gabapentin 100 mg 1-2 tabs at bedtime - Monitor and if symptoms worsen or medication is not helping notify the office  Flu shot need - Flu QUAD 6 MOS + PF IM given    Discussed med's effects and SE's. Screening labs and tests as requested with regular follow-up as recommended.  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   She does experience anxiety due to being alone often. Pt does have periods of low mood because family lives long distance and husband works long hours. Does have friends but long distance away.  She was evaluated at neurology for back and leg pain.  They did want to do a MRI but could not afford. She does continue to have the pain. Descfribes as an electrical pain that is worse at night.    BMI is There is no height or weight on file to calculate BMI., she has been working on diet and exercise. She has been cutting back on sodas.   Does yard work.  Wt Readings from Last 3 Encounters:  11/13/22 203 lb 9.6 oz (92.4 kg)  04/17/22 197 lb 9.6 oz (89.6 kg)  01/09/22 197 lb 3.2 oz (89.4 kg)   Today their BP is   BP Readings from Last 3 Encounters:  11/13/22 118/72  04/17/22 120/74  01/09/22 124/76    She does workout. She denies chest pain, shortness of breath, dizziness.   Patient has  noticed   She is not on cholesterol medication and denies myalgias. Her cholesterol is not at goal. The cholesterol last visit was:   Lab Results  Component Value Date   CHOL 179 11/13/2022   HDL 46 (L) 11/13/2022   LDLCALC 115 (H) 11/13/2022   TRIG 82 11/13/2022   CHOLHDL 3.9 11/13/2022   She has been working on diet and exercise for prediabetes, she is not on bASA, she is not on ACE/ARB (BP controlled) and denies foot ulcerations, increased appetite, nausea, paresthesia of the feet, polydipsia,  polyuria, visual disturbances, vomiting and weight loss. Last A1C in the office was:  Lab Results  Component Value Date   HGBA1C 6.0 (H) 01/09/2022   She is on thyroid medication qd  Lab Results  Component Value Date   TSH 5.28 (H) 11/13/2022   Last GFR: Lab Results  Component Value Date   EGFR 93 11/13/2022    Patient is on Vitamin D supplement, she is on 5000 IU every day.    Lab Results  Component Value Date   VD25OH 32 01/09/2022        Current Medications:  Current Outpatient Medications on File Prior to Visit  Medication Sig Dispense Refill   Calcium Carbonate (CALCIUM 600 PO) Take by mouth.     Cholecalciferol (VITAMIN D PO) Take 5,000 Int'l Units by mouth daily.     gabapentin (NEURONTIN) 300 MG capsule Take 1 capsule (300 mg total) by mouth 3 (three) times daily. 90 capsule 3   levothyroxine (SYNTHROID) 125 MCG tablet Take  1 tablet  Daily except Monday, Wednesday , Friday take 1 and 1/2 pill  on an empty stomach with only water for 30 minutes & no Antacid meds, Calcium or Magnesium for 4 hours & avoid Biotin. 108 tablet 1   MAGNESIUM PO Take by mouth.     OVER THE COUNTER MEDICATION daily. Potassium- pt unsure of dose     rosuvastatin (CRESTOR) 5 MG tablet Take 1 tablet by mouth once daily 30 tablet 0   No current facility-administered medications on file prior to visit.   Allergies:  Allergies  Allergen Reactions   Atorvastatin     Fatigue    Medical History:  She has Abnormal glucose (prediabetes); Hyperlipidemia; Vitamin D deficiency; Hypothyroidism; Depression, major, in remission (HCC); GERD (gastroesophageal reflux disease); Medication management; Obesity (BMI 30.0-34.9); Insomnia; Fatty liver; and B12 deficiency on their problem list. Health Maintenance:   Immunization History  Administered Date(s) Administered   Influenza Split 02/08/2014   Influenza, Seasonal, Injecte, Preservative Fre 02/21/2015   Influenza,inj,Quad PF,6+ Mos 01/09/2022    PFIZER Comirnaty(Gray Top)Covid-19 Tri-Sucrose Vaccine 08/30/2019   PFIZER(Purple Top)SARS-COV-2 Vaccination 08/09/2019   Pfizer Covid-19 Vaccine Bivalent Booster 70yrs & up 04/02/2020   Td 03/12/2002   Tdap 02/08/2014   Tetanus: 2015  Flu vaccine: 2019 declined this year- husband lost job Shingrix: declines  LMP: No LMP recorded. (Menstrual status: Perimenopausal). Pap: 08/2017 normal due 2024 MGM: 12/2018- due    Colonoscopy: 2019 can't find report - patient reports she has documentation, will present/mail EGD: n/a  Last Dental Exam: 2019, goes q6 month Last Eye Exam: Remote, will schedule  Patient Care Team: Lucky Cowboy, MD as PCP - General (Internal Medicine) Dorisann Frames, MD as Consulting Physician (Endocrinology)  Surgical History:  She has a past surgical history that includes thyroid ablation (2008). Family History:  Herfamily history includes Aneurysm in her brother and sister; Arthritis in her sister; Breast cancer in her mother;  Heart attack (age of onset: 52) in her paternal grandfather; Hyperlipidemia in her father and mother; Hypertension in her mother. Mother with pacemaker.  Social History:  She reports that she has never smoked. She has never used smokeless tobacco. She reports current alcohol use. She reports that she does not use drugs.  Review of Systems: Review of Systems  Constitutional:  Negative for chills, fever, malaise/fatigue and weight loss.  HENT:  Negative for congestion, hearing loss, sinus pain, sore throat and tinnitus.   Eyes:  Negative for blurred vision and double vision.  Respiratory:  Negative for cough, hemoptysis, sputum production, shortness of breath and wheezing.   Cardiovascular:  Negative for chest pain, palpitations, orthopnea, claudication, leg swelling and PND.  Gastrointestinal:  Negative for abdominal pain, blood in stool, constipation, diarrhea, heartburn, melena, nausea and vomiting.  Genitourinary: Negative.   Negative for dysuria and urgency.  Musculoskeletal:  Positive for back pain (tingling sensation that radiates to legs). Negative for falls, joint pain, myalgias and neck pain.  Skin:  Negative for rash.  Neurological:  Positive for tingling (legs and back). Negative for dizziness, tremors, sensory change, weakness and headaches.  Endo/Heme/Allergies:  Negative for polydipsia. Does not bruise/bleed easily.  Psychiatric/Behavioral:  Positive for depression (feels low mood occasionally). Negative for memory loss, substance abuse and suicidal ideas. The patient is nervous/anxious and has insomnia (uses melatonin as needed, wakes frequently).   All other systems reviewed and are negative.   Physical Exam: Estimated body mass index is 34.95 kg/m as calculated from the following:   Height as of 11/13/22: 5\' 4"  (1.626 m).   Weight as of 11/13/22: 203 lb 9.6 oz (92.4 kg). There were no vitals taken for this visit. General Appearance: Well nourished, in no apparent distress.  Eyes: PERRLA, EOMs, conjunctiva no swelling or erythema, normal fundi and vessels.  Sinuses: No Frontal/maxillary tenderness  ENT/Mouth: Ext aud canals clear, normal light reflex with TMs without erythema, bulging. Good dentition. No erythema, swelling, or exudate on post pharynx. Tonsils not swollen or erythematous. Hearing normal.  Respiratory: Respiratory effort normal, BS equal bilaterally without rales, rhonchi, wheezing or stridor.  Cardio: RRR without murmurs, rubs or gallops. Brisk peripheral pulses without edema.  Chest: symmetric, with normal excursions and percussion.  Breasts:Defer.  Abdomen: Soft, nontender, no guarding, rebound, hernias, masses, or organomegaly.  Lymphatics: Non tender without lymphadenopathy.  Genitourinary:Defer Musculoskeletal: Full ROM all peripheral extremities,5/5 strength, and normal gait.  Skin: Warm, dry without rashes, lesions, ecchymosis. Neuro: Cranial nerves intact, reflexes equal  bilaterally. Normal muscle tone, no cerebellar symptoms. Sensation intact.  Psych: Awake and oriented X 3, normal affect, Insight and Judgment appropriate.  EKG: NSR, no ST changes AAA:< 3 cm  Robin Montes  12:48 PM [facility]

## 2023-02-12 ENCOUNTER — Encounter: Payer: BC Managed Care – PPO | Admitting: Nurse Practitioner

## 2023-02-12 ENCOUNTER — Other Ambulatory Visit: Payer: Self-pay | Admitting: Internal Medicine

## 2023-02-12 DIAGNOSIS — Z136 Encounter for screening for cardiovascular disorders: Secondary | ICD-10-CM

## 2023-02-12 DIAGNOSIS — R7309 Other abnormal glucose: Secondary | ICD-10-CM

## 2023-02-12 DIAGNOSIS — K219 Gastro-esophageal reflux disease without esophagitis: Secondary | ICD-10-CM

## 2023-02-12 DIAGNOSIS — E559 Vitamin D deficiency, unspecified: Secondary | ICD-10-CM

## 2023-02-12 DIAGNOSIS — E66811 Obesity, class 1: Secondary | ICD-10-CM

## 2023-02-12 DIAGNOSIS — G629 Polyneuropathy, unspecified: Secondary | ICD-10-CM

## 2023-02-12 DIAGNOSIS — F325 Major depressive disorder, single episode, in full remission: Secondary | ICD-10-CM

## 2023-02-12 DIAGNOSIS — E782 Mixed hyperlipidemia: Secondary | ICD-10-CM

## 2023-02-12 DIAGNOSIS — E039 Hypothyroidism, unspecified: Secondary | ICD-10-CM

## 2023-02-12 DIAGNOSIS — Z79899 Other long term (current) drug therapy: Secondary | ICD-10-CM

## 2023-02-12 DIAGNOSIS — Z1231 Encounter for screening mammogram for malignant neoplasm of breast: Secondary | ICD-10-CM

## 2023-02-12 DIAGNOSIS — K760 Fatty (change of) liver, not elsewhere classified: Secondary | ICD-10-CM

## 2023-02-12 DIAGNOSIS — Z0001 Encounter for general adult medical examination with abnormal findings: Secondary | ICD-10-CM

## 2023-02-12 DIAGNOSIS — Z1389 Encounter for screening for other disorder: Secondary | ICD-10-CM

## 2023-02-12 DIAGNOSIS — G4700 Insomnia, unspecified: Secondary | ICD-10-CM

## 2023-04-02 ENCOUNTER — Ambulatory Visit
Admission: RE | Admit: 2023-04-02 | Discharge: 2023-04-02 | Disposition: A | Discharge status: Home / Self Care | Payer: BC Managed Care – PPO | Source: Ambulatory Visit | Attending: Internal Medicine | Admitting: Internal Medicine

## 2023-04-02 DIAGNOSIS — Z1231 Encounter for screening mammogram for malignant neoplasm of breast: Secondary | ICD-10-CM | POA: Diagnosis not present

## 2023-04-17 ENCOUNTER — Encounter: Payer: BC Managed Care – PPO | Admitting: Nurse Practitioner

## 2023-04-23 ENCOUNTER — Encounter: Payer: Self-pay | Admitting: *Deleted

## 2023-05-21 ENCOUNTER — Encounter: Payer: BC Managed Care – PPO | Admitting: Nurse Practitioner

## 2023-07-02 ENCOUNTER — Ambulatory Visit (HOSPITAL_BASED_OUTPATIENT_CLINIC_OR_DEPARTMENT_OTHER): Admitting: Family Medicine

## 2023-07-02 ENCOUNTER — Encounter (HOSPITAL_BASED_OUTPATIENT_CLINIC_OR_DEPARTMENT_OTHER): Payer: Self-pay | Admitting: Family Medicine

## 2023-07-02 VITALS — BP 133/82 | HR 85 | Ht 64.0 in | Wt 208.6 lb

## 2023-07-02 DIAGNOSIS — E782 Mixed hyperlipidemia: Secondary | ICD-10-CM

## 2023-07-02 DIAGNOSIS — Z1211 Encounter for screening for malignant neoplasm of colon: Secondary | ICD-10-CM | POA: Diagnosis not present

## 2023-07-02 DIAGNOSIS — R7309 Other abnormal glucose: Secondary | ICD-10-CM | POA: Diagnosis not present

## 2023-07-02 DIAGNOSIS — E039 Hypothyroidism, unspecified: Secondary | ICD-10-CM

## 2023-07-02 DIAGNOSIS — G609 Hereditary and idiopathic neuropathy, unspecified: Secondary | ICD-10-CM

## 2023-07-02 MED ORDER — ROSUVASTATIN CALCIUM 5 MG PO TABS: 5.0000 mg | ORAL_TABLET | Freq: Every evening | ORAL | 3 refills | Status: AC

## 2023-07-02 MED ORDER — LEVOTHYROXINE SODIUM 125 MCG PO TABS: 125.0000 ug | ORAL_TABLET | Freq: Every day | ORAL | 2 refills | Status: DC

## 2023-07-02 MED ORDER — PREGABALIN 25 MG PO CAPS: 25.0000 mg | ORAL_CAPSULE | Freq: Two times a day (BID) | ORAL | 3 refills | Status: DC

## 2023-07-02 NOTE — Progress Notes (Signed)
 New Patient Office Visit  Subjective:   Robin Montes 02/19/1961 07/02/2023  Chief Complaint  Patient presents with   New Patient (Initial Visit)    Patient is here today to get established with the practice. Is mainly here today to get established with a new PCP after prior PCP unexpectedly passed away.    HPI: Robin Montes presents today to establish care at [facility]. Introduced to Publishing rights manager role and practice setting.  All questions answered.   Last PCP: [place]  Last annual physical: 2023 Concerns: See below   IMPAIRED FASTING GLUCOSE Robin Montes is here for medical management of impaired fasting glucose.  Patient's current IFG medication regimen is: Diet, Exercise Adhering to a diabetic diet: Yes Exercising Regularly: Yes Checking Blood Sugars: No  Denies polydipsia, polyphagia, polyuria.  Lab Results  Component Value Date   HGBA1C 6.0 (H) 01/09/2022   HYPOTHYROIDISM: Patient presents for the medical management of Hypothyroidism. Current medication: 125mcg qd 1 tab daily except M-W-F take 1.5 tabs daily ( she reports she is only taking 1 tablet daily)  Patient compliant with medication regimen: no  Fatigue: no Cold intolerance: no Weight gain/loss: no Constipation: no Lower extremity edema: no Palpitations: no Hoarseness: no Neck Pain/Compression: no Difficulty Swallowing: no  Lab Results  Component Value Date   TSH 5.28 (H) 11/13/2022    NEUROPATHY:  Patient Is taking Gabapentin  300mg  TID for peripheral neuropathy for the past year. She has stopped taking the gabapentin  currently as it was not helping. She does take magnesium at night as needed.  Reports feeling hot shooting sensation down her legs, right worse than left that occur at night. She only reports pain occurring when relaxed. She denies prior back injury, pain or accident, or surgery. She was recommended to  lumbar MRI but could not afford the cost of it. Lumbar spine xray in 02/17/2021 was unremarkable.    HYPERLIPIDEMIA: Robin Montes presents for the medical management of hyperlipidemia.  Patient's current HLD regimen is: Crestor  5mg   Patient is  currently taking prescribed medications for HLD.  Adhering to heathy diet: Yes Exercising regularly: Yes Denies myalgias.  Lab Results  Component Value Date   CHOL 179 11/13/2022   HDL 46 (L) 11/13/2022   LDLCALC 115 (H) 11/13/2022   TRIG 82 11/13/2022   CHOLHDL 3.9 11/13/2022     The following portions of the patient's history were reviewed and updated as appropriate: past medical history, past surgical history, family history, social history, allergies, medications, and problem list.   Patient Active Problem List   Diagnosis Date Noted   B12 deficiency 07/25/2020   Fatty liver 08/13/2017   Insomnia 08/11/2017   Medication management 10/03/2014   Obesity (BMI 30.0-34.9) 10/03/2014   Abnormal glucose (prediabetes) 10/27/2013   Hyperlipidemia 10/27/2013   Vitamin D  deficiency 10/27/2013   Hypothyroidism 10/27/2013   Depression, major, in remission (HCC) 10/27/2013   GERD (gastroesophageal reflux disease) 10/27/2013   Past Medical History:  Diagnosis Date   Cholelithiasis    Depression    Fibrocystic breast disease    GERD (gastroesophageal reflux disease)    Hyperlipidemia    Hypothyroidism    Labile hypertension    Migraine    Prediabetes    Past Surgical History:  Procedure Laterality Date   thyroid  ablation  2008   Family History  Problem Relation Age of Onset   Hypertension Mother    Breast cancer Mother  83   Hyperlipidemia Mother    Hyperlipidemia Father    Aneurysm Sister        brain   Arthritis Sister    Heart attack Paternal Grandfather 63   Aneurysm Brother    Social History   Socioeconomic History   Marital status: Married    Spouse name: Not on file   Number of children: 0   Years of  education: 14   Highest education level: Not on file  Occupational History   Occupation: housewife  Tobacco Use   Smoking status: Never   Smokeless tobacco: Never  Vaping Use   Vaping status: Never Used  Substance and Sexual Activity   Alcohol use: Yes    Comment: rarely   Drug use: No   Sexual activity: Yes    Partners: Male  Other Topics Concern   Not on file  Social History Narrative   Coffee every morning (1/2 cup)   Right handed    Social Drivers of Corporate investment banker Strain: Not on file  Food Insecurity: Not on file  Transportation Needs: Not on file  Physical Activity: Not on file  Stress: Not on file  Social Connections: Not on file  Intimate Partner Violence: Not on file   Outpatient Medications Prior to Visit  Medication Sig Dispense Refill   Calcium  Carbonate (CALCIUM  600 PO) Take by mouth.     Cholecalciferol (VITAMIN D  PO) Take 5,000 Int'l Units by mouth daily.     MAGNESIUM PO Take by mouth.     levothyroxine  (SYNTHROID ) 125 MCG tablet Take  1 tablet  Daily except Monday, Wednesday , Friday take 1 and 1/2 pill  on an empty stomach with only water for 30 minutes & no Antacid meds, Calcium  or Magnesium for 4 hours & avoid Biotin. 108 tablet 1   rosuvastatin  (CRESTOR ) 5 MG tablet Take 1 tablet by mouth once daily 30 tablet 0   gabapentin  (NEURONTIN ) 300 MG capsule Take 1 capsule (300 mg total) by mouth 3 (three) times daily. (Patient not taking: Reported on 07/02/2023) 90 capsule 3   OVER THE COUNTER MEDICATION daily. Potassium- pt unsure of dose (Patient not taking: Reported on 07/02/2023)     No facility-administered medications prior to visit.   Allergies  Allergen Reactions   Atorvastatin      Fatigue     ROS: A complete ROS was performed with pertinent positives/negatives noted in the HPI. The remainder of the ROS are negative.   Objective:   Today's Vitals   07/02/23 1306  BP: 133/82  Pulse: 85  SpO2: 99%  Weight: 208 lb 9.6 oz (94.6  kg)  Height: 5\' 4"  (1.626 m)    GENERAL: Well-appearing, in NAD. Well nourished.  SKIN: Pink, warm and dry.  Head: Normocephalic. NECK: Trachea midline. Full ROM w/o pain or tenderness.  RESPIRATORY: Chest wall symmetrical. Respirations even and non-labored. Breath sounds clear to auscultation bilaterally.  CARDIAC: S1, S2 present, regular rate and rhythm without murmur or gallops. Peripheral pulses 2+ bilaterally.  MSK: Muscle tone and strength appropriate for age.  NEUROLOGIC: No motor or sensory deficits. Steady, even gait. C2-C12 intact.  PSYCH/MENTAL STATUS: Alert, oriented x 3. Cooperative, appropriate mood and affect.    Health Maintenance Due  Topic Date Due   HIV Screening  Never done   Zoster Vaccines- Shingrix (1 of 2) Never done   Colonoscopy  Never done   Cervical Cancer Screening (HPV/Pap Cotest)  08/13/2022   COVID-19 Vaccine (4 -  2024-25 season) 11/11/2022    No results found for any visits on 07/02/23.     Assessment & Plan:  1. Screening for colon cancer (Primary) - Ambulatory referral to Gastroenterology  2. Hypothyroidism, unspecified type Previously controlled. Will assess TSH with morning labwork and titrate medication if needed.  - levothyroxine  (SYNTHROID ) 125 MCG tablet; Take 1 tablet (125 mcg total) by mouth daily before breakfast.  Dispense: 90 tablet; Refill: 2 - TSH; Future  3. Mixed hyperlipidemia Previously controlled per chart review. Continue Crestor  and will assess LP with fasting labs.  - rosuvastatin  (CRESTOR ) 5 MG tablet; Take 1 tablet (5 mg total) by mouth at bedtime.  Dispense: 90 tablet; Refill: 3 - Lipid panel; Future  4. Abnormal glucose (prediabetes) Discussed prediabetic diet and exercise. Check CMP and A1C with fasting labs. May recommend starting Metformin pending result.s  - Comprehensive metabolic panel with GFR; Future - Hemoglobin A1c; Future  5. Idiopathic peripheral neuropathy Uncontrolled. Pt declined to proceed with  MRI lumbar spine. Would like to trial Lyrica . Start Lyrica  25mg  BID and notify PCP if needing to increase dosage.    Patient to reach out to office if new, worrisome, or unresolved symptoms arise or if no improvement in patient's condition. Patient verbalized understanding and is agreeable to treatment plan. All questions answered to patient's satisfaction.    Return for 2 visits: Pap within 1-2 months and 6 month Follow up AE, Chronic Conditions.    Nonda Bays, [place]

## 2023-07-16 ENCOUNTER — Other Ambulatory Visit (HOSPITAL_COMMUNITY): Payer: Self-pay | Admitting: Family Medicine

## 2023-07-16 ENCOUNTER — Other Ambulatory Visit (HOSPITAL_BASED_OUTPATIENT_CLINIC_OR_DEPARTMENT_OTHER): Payer: Self-pay | Admitting: *Deleted

## 2023-07-16 DIAGNOSIS — R7309 Other abnormal glucose: Secondary | ICD-10-CM

## 2023-07-16 DIAGNOSIS — E039 Hypothyroidism, unspecified: Secondary | ICD-10-CM

## 2023-07-16 DIAGNOSIS — E782 Mixed hyperlipidemia: Secondary | ICD-10-CM

## 2023-07-17 LAB — TSH: TSH: 2.63 u[IU]/mL (ref 0.450–4.500)

## 2023-07-17 LAB — COMPREHENSIVE METABOLIC PANEL WITH GFR
ALT: 57 IU/L — ABNORMAL HIGH (ref 0–32)
AST: 37 IU/L (ref 0–40)
Albumin: 4.5 g/dL (ref 3.9–4.9)
Alkaline Phosphatase: 81 IU/L (ref 44–121)
BUN/Creatinine Ratio: 21 (ref 12–28)
BUN: 14 mg/dL (ref 8–27)
Bilirubin Total: 0.5 mg/dL (ref 0.0–1.2)
CO2: 22 mmol/L (ref 20–29)
Calcium: 9.4 mg/dL (ref 8.7–10.3)
Chloride: 106 mmol/L (ref 96–106)
Creatinine, Ser: 0.66 mg/dL (ref 0.57–1.00)
Globulin, Total: 2.4 g/dL (ref 1.5–4.5)
Glucose: 105 mg/dL — ABNORMAL HIGH (ref 70–99)
Potassium: 4.2 mmol/L (ref 3.5–5.2)
Sodium: 143 mmol/L (ref 134–144)
Total Protein: 6.9 g/dL (ref 6.0–8.5)
eGFR: 100 mL/min/{1.73_m2} (ref >59)

## 2023-07-17 LAB — LIPID PANEL
Chol/HDL Ratio: 2.9 ratio (ref 0.0–4.4)
Cholesterol, Total: 125 mg/dL (ref 100–199)
HDL: 43 mg/dL (ref >39)
LDL Chol Calc (NIH): 65 mg/dL (ref 0–99)
Triglycerides: 86 mg/dL (ref 0–149)
VLDL Cholesterol Cal: 17 mg/dL (ref 5–40)

## 2023-07-17 LAB — HEMOGLOBIN A1C
Est. average glucose Bld gHb Est-mCnc: 131 mg/dL
Hgb A1c MFr Bld: 6.2 % — ABNORMAL HIGH (ref 4.8–5.6)

## 2023-07-17 NOTE — Progress Notes (Signed)
 Please notify Robin Montes that her A1c has gone up slightly from 6.0 up to 6.2.  She should continue dietary changes and if desiring medication we can start metformin.  Her renal function and electrolytes are stable.  Her ALT did go up slightly and is likely due to fatty liver along with prediabetes.  We can recheck this and continue to monitor at her next visit.  Thyroid  function is stable.  Her cholesterol is well-controlled on current medication

## 2023-07-31 ENCOUNTER — Encounter: Payer: Self-pay | Admitting: Family Medicine

## 2023-08-20 ENCOUNTER — Other Ambulatory Visit (HOSPITAL_COMMUNITY)
Admission: RE | Admit: 2023-08-20 | Discharge: 2023-08-20 | Disposition: A | Discharge status: Home / Self Care | Source: Ambulatory Visit | Attending: Family Medicine | Admitting: Family Medicine

## 2023-08-20 ENCOUNTER — Ambulatory Visit (HOSPITAL_BASED_OUTPATIENT_CLINIC_OR_DEPARTMENT_OTHER): Admitting: Family Medicine

## 2023-08-20 ENCOUNTER — Encounter (HOSPITAL_BASED_OUTPATIENT_CLINIC_OR_DEPARTMENT_OTHER): Payer: Self-pay | Admitting: Family Medicine

## 2023-08-20 VITALS — BP 126/82 | HR 88 | Ht 64.0 in | Wt 205.0 lb

## 2023-08-20 DIAGNOSIS — L247 Irritant contact dermatitis due to plants, except food: Secondary | ICD-10-CM | POA: Diagnosis not present

## 2023-08-20 DIAGNOSIS — Z124 Encounter for screening for malignant neoplasm of cervix: Secondary | ICD-10-CM

## 2023-08-20 MED ORDER — CLOBETASOL PROPIONATE 0.05 % EX CREA
1.0000 | TOPICAL_CREAM | Freq: Two times a day (BID) | CUTANEOUS | 0 refills | Status: AC
Start: 2023-08-20 — End: ?

## 2023-08-20 NOTE — Progress Notes (Signed)
 Subjective:   Robin Montes [facility] 12-28-1960 08/20/2023  Chief Complaint  Patient presents with   Medical Management of Chronic Issues    Patient is here today for her pap smear.    HPI: Robin Montes presents today for pap smear. She declined offer for STI/STD testing.   Pt also reports itchy rash to bilateral forearms with exposure to poison ivy 2wks ago.  RASH: Onset: 2wks ago while doing yard work  Location: bilateral forearms  Course of rash: persistent, itchy  Treatments tried: Clorox bleach mixed with water            New medications/antibiotics: no  Tick/insect/pet exposure: no  Recent travel: no  New detergent, new clothing, or other topical exposure: no   Pruritis: yes  Tenderness: no  Feeling ill: no  Fever: no  Mouth lesions: no  Facial/tongue swelling/difficulty breathing:  no  Diabetic or immunocompromised: no    The following portions of the patient's history were reviewed and updated as appropriate: past medical history, past surgical history, family history, social history, allergies, medications, and problem list.   Patient Active Problem List   Diagnosis Date Noted   B12 deficiency 07/25/2020   Fatty liver 08/13/2017   Insomnia 08/11/2017   Medication management 10/03/2014   Obesity (BMI 30.0-34.9) 10/03/2014   Abnormal glucose (prediabetes) 10/27/2013   Hyperlipidemia 10/27/2013   Vitamin D  deficiency 10/27/2013   Hypothyroidism 10/27/2013   Depression, major, in remission (HCC) 10/27/2013   GERD (gastroesophageal reflux disease) 10/27/2013   Past Medical History:  Diagnosis Date   Cholelithiasis    Depression    Fibrocystic breast disease    GERD (gastroesophageal reflux disease)    Hyperlipidemia    Hypothyroidism    Labile hypertension    Migraine    Prediabetes    Past Surgical History:  Procedure Laterality Date   thyroid  ablation  2008   Family History  Problem Relation Age of Onset   Hypertension Mother     Breast cancer Mother 16   Hyperlipidemia Mother    Hyperlipidemia Father    Aneurysm Sister        brain   Arthritis Sister    Heart attack Paternal Grandfather 81   Aneurysm Brother    Outpatient Medications Prior to Visit  Medication Sig Dispense Refill   Calcium  Carbonate (CALCIUM  600 PO) Take by mouth.     Cholecalciferol (VITAMIN D  PO) Take 5,000 Int'l Units by mouth daily.     levothyroxine  (SYNTHROID ) 125 MCG tablet Take 1 tablet (125 mcg total) by mouth daily before breakfast. 90 tablet 2   MAGNESIUM PO Take by mouth.     pregabalin  (LYRICA ) 25 MG capsule Take 1 capsule (25 mg total) by mouth 2 (two) times daily. 60 capsule 3   rosuvastatin  (CRESTOR ) 5 MG tablet Take 1 tablet (5 mg total) by mouth at bedtime. 90 tablet 3   No facility-administered medications prior to visit.   Allergies  Allergen Reactions   Atorvastatin      Fatigue      ROS: A complete ROS was performed with pertinent positives/negatives noted in the HPI. The remainder of the ROS are negative.    Objective:   Today's Vitals   08/20/23 0855  BP: 126/82  Pulse: 88  SpO2: 97%  Weight: 205 lb (93 kg)  Height: 5\' 4"  (1.626 m)   GENERAL: Well-appearing, in NAD. Well nourished.  SKIN: Pink, warm and dry. Multiple areas of a raised red rash with  vesicles noted to bilateral forearms. No drainage, ecchymoses.   Head: Normocephalic. NECK: Trachea midline. Full ROM w/o pain or tenderness. No lymphadenopathy.  EYES: Conjunctiva clear without exudates. EOMI, PERRL, no drainage present.  THROAT: Uvula midline. Mucous membranes pink and moist.  RESPIRATORY: Chest wall symmetrical. Respirations even and non-labored.  CARDIAC: Peripheral pulses 2+ bilaterally.  GU: External genitalia without erythema, lesions, or masses. No lymphadenopathy. Vaginal mucosa pink and moist without exudate, lesions, or ulcerations. Cervix pink without discharge. Cervical os closed. Uterus and adnexae palpable, not enlarged, and w/o  tenderness. No palpable masses. Chaperoned by Sylvester Evert, FNP-C.  MSK: Muscle tone and strength appropriate for age. Joints w/o tenderness, redness, or swelling.  EXTREMITIES: Without clubbing, cyanosis, or edema.  NEUROLOGIC: No motor or sensory deficits. Steady, even gait. C2-C12 intact.  PSYCH/MENTAL STATUS: Alert, oriented x 3. Cooperative, appropriate mood and affect.   Health Maintenance Due  Topic Date Due   HIV Screening  Never done   Zoster Vaccines- Shingrix (1 of 2) Never done   Colonoscopy  Never done   Cervical Cancer Screening (HPV/Pap Cotest)  08/13/2022   COVID-19 Vaccine (4 - 2024-25 season) 11/11/2022    No results found for any visits on 08/20/23.  The ASCVD Risk score (Arnett DK, et al., 2019) failed to calculate for the following reasons:   The valid total cholesterol range is 130 to 320 mg/dL     Assessment & Plan:  1. Encounter for Papanicolaou smear for cervical cancer screening (Primary) Pap smear completed.  - Cytology - PAP  2. Irritant contact dermatitis due to plants, except food Recommended long sleeves and gloves while performing yard work to help control poison ivy/oak/sumac exposure. Also discussed using Dawn dish soap to remove plant oil with future exposures, good hand hygiene, and washing any linen (including clothes) that comes in contact with plant oil. Advised OTC calamine lotion for discomfort. Prescribed clobetasol cream for pt to apply twice daily; discussed if sx persist or worsen within the next week, reach out to PCP.  - clobetasol cream (TEMOVATE) 0.05 %; Apply 1 Application topically 2 (two) times daily.  Dispense: 30 g; Refill: 0  Meds ordered this encounter  Medications   clobetasol cream (TEMOVATE) 0.05 %    Sig: Apply 1 Application topically 2 (two) times daily.    Dispense:  30 g    Refill:  0    Supervising Provider:   DE Peru, RAYMOND J [[id number]]   Lab Orders  No laboratory test(s) ordered today    Return if symptoms  worsen or fail to improve.    Patient to reach out to office if new, worrisome, or unresolved symptoms arise or if no improvement in patient's condition. Patient verbalized understanding and is agreeable to treatment plan. All questions answered to patient's satisfaction.   Sylvester Evert, FNP-C

## 2023-08-20 NOTE — Progress Notes (Signed)
 Subjective:   Robin Montes [facility] 1960-09-25 08/20/2023  Chief Complaint  Patient presents with   Medical Management of Chronic Issues    Patient is here today for her pap smear.    HPI: Robin Montes presents today for pap smear. She declined offer for STI/STD testing.   Pt also reports itchy rash to bilateral forearms with exposure to poison ivy 2wks ago.  RASH: Onset: 2wks ago while doing yard work  Location: bilateral forearms  Course of rash: persistent, itchy  Treatments tried: Clorox bleach mixed with water            New medications/antibiotics: no  Tick/insect/pet exposure: no  Recent travel: no  New detergent, new clothing, or other topical exposure: no   Pruritis: yes  Tenderness: no  Feeling ill: no  Fever: no  Mouth lesions: no  Facial/tongue swelling/difficulty breathing:  no  Diabetic or immunocompromised: no    The following portions of the patient's history were reviewed and updated as appropriate: past medical history, past surgical history, family history, social history, allergies, medications, and problem list.   Patient Active Problem List   Diagnosis Date Noted   B12 deficiency 07/25/2020   Fatty liver 08/13/2017   Insomnia 08/11/2017   Medication management 10/03/2014   Obesity (BMI 30.0-34.9) 10/03/2014   Abnormal glucose (prediabetes) 10/27/2013   Hyperlipidemia 10/27/2013   Vitamin D  deficiency 10/27/2013   Hypothyroidism 10/27/2013   Depression, major, in remission (HCC) 10/27/2013   GERD (gastroesophageal reflux disease) 10/27/2013   Past Medical History:  Diagnosis Date   Cholelithiasis    Depression    Fibrocystic breast disease    GERD (gastroesophageal reflux disease)    Hyperlipidemia    Hypothyroidism    Labile hypertension    Migraine    Prediabetes    Past Surgical History:  Procedure Laterality Date   thyroid  ablation  2008   Family History  Problem Relation Age of Onset   Hypertension Mother     Breast cancer Mother 83   Hyperlipidemia Mother    Hyperlipidemia Father    Aneurysm Sister        brain   Arthritis Sister    Heart attack Paternal Grandfather 1   Aneurysm Brother    Outpatient Medications Prior to Visit  Medication Sig Dispense Refill   Calcium  Carbonate (CALCIUM  600 PO) Take by mouth.     Cholecalciferol (VITAMIN D  PO) Take 5,000 Int'l Units by mouth daily.     levothyroxine  (SYNTHROID ) 125 MCG tablet Take 1 tablet (125 mcg total) by mouth daily before breakfast. 90 tablet 2   MAGNESIUM PO Take by mouth.     pregabalin  (LYRICA ) 25 MG capsule Take 1 capsule (25 mg total) by mouth 2 (two) times daily. 60 capsule 3   rosuvastatin  (CRESTOR ) 5 MG tablet Take 1 tablet (5 mg total) by mouth at bedtime. 90 tablet 3   No facility-administered medications prior to visit.   Allergies  Allergen Reactions   Atorvastatin      Fatigue      ROS: A complete ROS was performed with pertinent positives/negatives noted in the HPI. The remainder of the ROS are negative.    Objective:   Today's Vitals   08/20/23 0855  BP: 126/82  Pulse: 88  SpO2: 97%  Weight: 93 kg  Height: 5\' 4"  (1.626 m)   GENERAL: Well-appearing, in NAD. Well nourished.  SKIN: Pink, warm and dry. Multiple areas of a raised red rash with vesicles noted  to bilateral forearms. No drainage, ecchymoses.   Head: Normocephalic. NECK: Trachea midline. Full ROM w/o pain or tenderness. No lymphadenopathy.  EYES: Conjunctiva clear without exudates. EOMI, PERRL, no drainage present.  THROAT: Uvula midline. Mucous membranes pink and moist.  RESPIRATORY: Chest wall symmetrical. Respirations even and non-labored.  CARDIAC: Peripheral pulses 2+ bilaterally.  GU: External genitalia without erythema, lesions, or masses. No lymphadenopathy. Vaginal mucosa pink and moist without exudate, lesions, or ulcerations. Cervix pink without discharge. Cervical os closed. Uterus and adnexae palpable, not enlarged, and w/o  tenderness. No palpable masses. Chaperoned by Sylvester Evert, FNP-C.  MSK: Muscle tone and strength appropriate for age. Joints w/o tenderness, redness, or swelling.  EXTREMITIES: Without clubbing, cyanosis, or edema.  NEUROLOGIC: No motor or sensory deficits. Steady, even gait. C2-C12 intact.  PSYCH/MENTAL STATUS: Alert, oriented x 3. Cooperative, appropriate mood and affect.   Health Maintenance Due  Topic Date Due   HIV Screening  Never done   Zoster Vaccines- Shingrix (1 of 2) Never done   Colonoscopy  Never done   Cervical Cancer Screening (HPV/Pap Cotest)  08/13/2022   COVID-19 Vaccine (4 - 2024-25 season) 11/11/2022    No results found for any visits on 08/20/23.  The ASCVD Risk score (Arnett DK, et al., 2019) failed to calculate for the following reasons:   The valid total cholesterol range is 130 to 320 mg/dL     Assessment & Plan:  1. Encounter for Papanicolaou smear for cervical cancer screening (Primary) Pap smear completed.  - Cytology - PAP  2. Irritant contact dermatitis due to plants, except food Recommended long sleeves and gloves while performing yard work to help control poison ivy/oak/sumac exposure. Also discussed using Dawn dish soap to remove plant oil with future exposures, good hand hygiene, and washing any linen (including clothes) that comes in contact with plant oil. Advised OTC calamine lotion for discomfort. Prescribed clobetasol cream for pt to apply twice daily; discussed if sx persist or worsen within the next week, reach out to PCP.  - clobetasol cream (TEMOVATE) 0.05 %; Apply 1 Application topically 2 (two) times daily.  Dispense: 30 g; Refill: 0  Encounter for Papanicolaou smear for cervical cancer screening   No orders of the defined types were placed in this encounter.  Lab Orders  No laboratory test(s) ordered today   No images are attached to the encounter or orders placed in the encounter.  No follow-ups on file.    Patient to reach  out to office if new, worrisome, or unresolved symptoms arise or if no improvement in patient's condition. Patient verbalized understanding and is agreeable to treatment plan. All questions answered to patient's satisfaction.   Treatment plan and recommendation(s) reviewed by supervising preceptor, Annell Barrow, FNP-C, prior to clinic discharge.   Therisa Flatten, BSN, RN  DNP Student

## 2023-08-22 ENCOUNTER — Ambulatory Visit (HOSPITAL_BASED_OUTPATIENT_CLINIC_OR_DEPARTMENT_OTHER): Payer: Self-pay | Admitting: Family Medicine

## 2023-08-22 LAB — CYTOLOGY - PAP
Adequacy: ABSENT
Comment: NEGATIVE
Diagnosis: NEGATIVE
High risk HPV: NEGATIVE

## 2023-08-22 NOTE — Progress Notes (Signed)
 Pap Smear is normal. Health maintenance updated. Repeat pap in 5 years.

## 2023-09-25 ENCOUNTER — Telehealth (HOSPITAL_BASED_OUTPATIENT_CLINIC_OR_DEPARTMENT_OTHER): Payer: Self-pay | Admitting: Family Medicine

## 2023-09-25 NOTE — Telephone Encounter (Signed)
 Copied from CRM [id number]. Topic: Clinical - Lab/Test Results >> Sep 25, 2023 12:27 PM Willma R wrote: Reason for CRM: Patient is requesting the results to her PAP Smear to be sent to her in the mail. Relayed information but still would like mailed to the address on file.  Patient can be reached at [phone]

## 2023-09-27 NOTE — Telephone Encounter (Signed)
 Results mailed to  pt.

## 2023-11-03 ENCOUNTER — Other Ambulatory Visit (HOSPITAL_BASED_OUTPATIENT_CLINIC_OR_DEPARTMENT_OTHER): Payer: Self-pay | Admitting: Family Medicine

## 2024-01-07 ENCOUNTER — Other Ambulatory Visit (HOSPITAL_BASED_OUTPATIENT_CLINIC_OR_DEPARTMENT_OTHER): Payer: Self-pay

## 2024-01-07 ENCOUNTER — Encounter (HOSPITAL_BASED_OUTPATIENT_CLINIC_OR_DEPARTMENT_OTHER): Payer: Self-pay | Admitting: Family Medicine

## 2024-01-07 ENCOUNTER — Ambulatory Visit (INDEPENDENT_AMBULATORY_CARE_PROVIDER_SITE_OTHER): Admitting: Family Medicine

## 2024-01-07 VITALS — BP 137/63 | HR 88 | Ht 64.0 in | Wt 202.0 lb

## 2024-01-07 DIAGNOSIS — Z23 Encounter for immunization: Secondary | ICD-10-CM

## 2024-01-07 DIAGNOSIS — E782 Mixed hyperlipidemia: Secondary | ICD-10-CM | POA: Diagnosis not present

## 2024-01-07 DIAGNOSIS — R7303 Prediabetes: Secondary | ICD-10-CM | POA: Diagnosis not present

## 2024-01-07 DIAGNOSIS — E039 Hypothyroidism, unspecified: Secondary | ICD-10-CM

## 2024-01-07 DIAGNOSIS — E890 Postprocedural hypothyroidism: Secondary | ICD-10-CM | POA: Insufficient documentation

## 2024-01-07 DIAGNOSIS — R221 Localized swelling, mass and lump, neck: Secondary | ICD-10-CM | POA: Insufficient documentation

## 2024-01-07 DIAGNOSIS — Z0000 Encounter for general adult medical examination without abnormal findings: Secondary | ICD-10-CM | POA: Diagnosis not present

## 2024-01-07 DIAGNOSIS — E559 Vitamin D deficiency, unspecified: Secondary | ICD-10-CM

## 2024-01-07 DIAGNOSIS — G609 Hereditary and idiopathic neuropathy, unspecified: Secondary | ICD-10-CM | POA: Insufficient documentation

## 2024-01-07 DIAGNOSIS — Z1211 Encounter for screening for malignant neoplasm of colon: Secondary | ICD-10-CM

## 2024-01-07 NOTE — Patient Instructions (Addendum)
 Shingrix Vaccine- in pharmacy   Consider ultrasound for area under your jaw.    Recommend starting probiotic and fiber supplement daily or bloating.

## 2024-01-07 NOTE — Progress Notes (Signed)
 Subjective:   [facility] 1960-08-13  01/07/2024   CC: Chief Complaint  Patient presents with   Annual Exam    Patient is here today for her physical. Denies any main concerns for today's visit.    HPI: Robin Montes is a 63 y.o. female who presents for a routine health maintenance exam.  Labs collected at time of visit. She reports no concerns at this time. May need refill for lyrica  for neuropathy as this is working well.    HEALTH SCREENINGS: - Vision Screening: not applicable - Dental Visits: up to date - Pap smear: up to date - Breast Exam: Declined - STD Screening: Declined - Mammogram (40+): Up to date  - Colonoscopy (45+): Cologuard ordered today; pt declined cscope   - Bone Density (65+ or under 65 with predisposing conditions): Not applicable  - Lung CA screening with low-dose CT:  Not applicable Adults age 53-80 who are current cigarette smokers or quit within the last 15 years. Must have 20 pack year history.   Depression and Anxiety Screen done today and results listed below:     01/07/2024    1:10 PM 08/20/2023    8:59 AM 07/02/2023    1:12 PM 08/12/2017    2:24 PM  Depression screen PHQ 2/9  Decreased Interest 0 0 0 0  Down, Depressed, Hopeless 0 0 1 0  PHQ - 2 Score 0 0 1 0  Altered sleeping 1 1 1    Tired, decreased energy 1 1 1    Change in appetite 0 0 0   Feeling bad or failure about yourself  0 0 0   Trouble concentrating 0 0 0   Moving slowly or fidgety/restless 0 0 0   Suicidal thoughts 0 0 0   PHQ-9 Score 2 2 3    Difficult doing work/chores Not difficult at all Not difficult at all Not difficult at all       01/07/2024    1:10 PM 08/20/2023    9:00 AM 07/02/2023    1:15 PM  GAD 7 : Generalized Anxiety Score  Nervous, Anxious, on Edge 0 0 0  Control/stop worrying 0 0 0  Worry too much - different things 0 0 0  Trouble relaxing 0 0 0  Restless 0 0 0  Easily annoyed or irritable 0 0 0  Afraid - awful might happen 0 0 0  Total  GAD 7 Score 0 0 0  Anxiety Difficulty Not difficult at all Not difficult at all Not difficult at all    IMMUNIZATIONS: - Tdap: Tetanus vaccination status reviewed: last tetanus booster within 10 years. - HPV: Not applicable - Influenza: Administered today - Pneumovax: Not applicable - Prevnar 20: Not applicable - Shingrix (50+): Recommended to obtain in pharmacy    Past medical history, surgical history, medications, allergies, family history and social history reviewed with patient today and changes made to appropriate areas of the chart.   Past Medical History:  Diagnosis Date   Cholelithiasis    Depression    Fibrocystic breast disease    GERD (gastroesophageal reflux disease)    Hyperlipidemia    Hypothyroidism    Labile hypertension    Migraine    Postoperative hypothyroidism 01/07/2024   Prediabetes     Past Surgical History:  Procedure Laterality Date   thyroid  ablation  2008    Current Outpatient Medications on File Prior to Visit  Medication Sig   Calcium  Carbonate (CALCIUM  600 PO) Take by mouth.  Cholecalciferol (VITAMIN D  PO) Take 5,000 Int'l Units by mouth daily.   clobetasol  cream (TEMOVATE ) 0.05 % Apply 1 Application topically 2 (two) times daily.   levothyroxine  (SYNTHROID ) 125 MCG tablet Take 1 tablet (125 mcg total) by mouth daily before breakfast.   MAGNESIUM PO Take by mouth.   pregabalin  (LYRICA ) 25 MG capsule Take 1 capsule by mouth twice daily   rosuvastatin  (CRESTOR ) 5 MG tablet Take 1 tablet (5 mg total) by mouth at bedtime.   No current facility-administered medications on file prior to visit.    Allergies  Allergen Reactions   Atorvastatin      Fatigue      Social History   Socioeconomic History   Marital status: Married    Spouse name: Not on file   Number of children: 0   Years of education: 14   Highest education level: Not on file  Occupational History   Occupation: housewife  Tobacco Use   Smoking status: Never    Smokeless tobacco: Never  Vaping Use   Vaping status: Never Used  Substance and Sexual Activity   Alcohol use: Yes    Comment: rarely   Drug use: No   Sexual activity: Yes    Partners: Male  Other Topics Concern   Not on file  Social History Narrative   Coffee every morning (1/2 cup)   Right handed    Social Drivers of Corporate Investment Banker Strain: Not on file  Food Insecurity: Not on file  Transportation Needs: Not on file  Physical Activity: Not on file  Stress: Not on file  Social Connections: Not on file  Intimate Partner Violence: Not on file   Social History   Tobacco Use  Smoking Status Never  Smokeless Tobacco Never   Social History   Substance and Sexual Activity  Alcohol Use Yes   Comment: rarely    Family History  Problem Relation Age of Onset   Hypertension Mother    Breast cancer Mother 33   Hyperlipidemia Mother    Hyperlipidemia Father    Aneurysm Sister        brain   Arthritis Sister    Heart attack Paternal Grandfather 35   Aneurysm Brother      ROS: Denies fever, fatigue, unexplained weight loss/gain, chest pain, SHOB, and palpitations. Denies neurological deficits, gastrointestinal or genitourinary complaints, and skin changes.   Objective:   Today's Vitals   01/07/24 1305  BP: 137/63  Pulse: 88  SpO2: 99%  Weight: 202 lb (91.6 kg)  Height: 5' 4 (1.626 m)    GENERAL APPEARANCE: Well-appearing, in NAD. Well nourished.  SKIN: Pink, warm and dry. Turgor normal. No rash, lesion, ulceration, or ecchymoses. Hair evenly distributed.  HEENT: HEAD: Normocephalic. Palpable mass present to mid submental area. Mass is painless, mobile. EYES: PERRLA. EOMI. Lids intact w/o defect. Sclera white, Conjunctiva pink w/o exudate.  EARS: External ear w/o redness, swelling, masses or lesions. EAC clear. TM's intact, translucent w/o bulging, appropriate landmarks visualized. Appropriate acuity to conversational tones.  NOSE: Septum midline  w/o deformity. Nares patent, mucosa pink and non-inflamed w/o drainage. No sinus tenderness.  THROAT: Uvula midline. Oropharynx clear. Tonsils non-inflamed w/o exudate. Oral mucosa pink and moist.  NECK: Supple, Trachea midline. Full ROM w/o pain or tenderness. No lymphadenopathy. Thyroid  non-tender w/o enlargement or palpable masses.  RESPIRATORY: Chest wall symmetrical w/o masses. Respirations even and non-labored. Breath sounds clear to auscultation bilaterally. No wheezes, rales, rhonchi, or crackles. CARDIAC: S1, S2 present,  regular rate and rhythm. No gallops, murmurs, rubs, or clicks. PMI w/o lifts, heaves, or thrills. No carotid bruits. Capillary refill <2 seconds. Peripheral pulses 2+ bilaterally. GI: Abdomen soft w/o distention. Normoactive bowel sounds. No palpable masses or tenderness. No guarding or rebound tenderness. Liver and spleen w/o tenderness or enlargement. No CVA tenderness.  MSK: Muscle tone and strength appropriate for age, w/o atrophy or abnormal movement.  EXTREMITIES: Active ROM intact, w/o tenderness, crepitus, or contracture. No obvious joint deformities or effusions. No clubbing, edema, or cyanosis.  NEUROLOGIC: CN's II-XII intact. Motor strength symmetrical with no obvious weakness. No sensory deficits. DTR's 2+ symmetric bilaterally. Steady, even gait.  PSYCH/MENTAL STATUS: Alert, oriented x 3. Cooperative, appropriate mood and affect.     Assessment & Plan:  1. Annual physical exam (Primary) Discussed preventative screenings, vaccines, and healthy lifestyle with patient. Labs collected as part of AE today. Recommended obtaining Shingrix vaccine in pharmacy at patient's convenience.  - CBC with Differential/Platelet - Comprehensive metabolic panel with GFR - TSH  2. Mixed hyperlipidemia Controlled with statin therapy. Will check and resume yearly labs with LP today if controlled.  - Lipid panel  3. Prediabetes Controlled previously with diet. Will check A1c  with labs today.  - Hemoglobin A1c  4. Vitamin D  deficiency Previously deficient. Will check Vit d with labs today.  - VITAMIN D  25 Hydroxy (Vit-D Deficiency, Fractures)  5. Hypothyroidism, unspecified type Controlled with levothyroxine  previously. Pt is asymptomatic. Will check TSH with labs today.  - TSH  6. Screening for colon cancer Pt declined cscope, but would like to complete Cologuard. Order placed.  - Cologuard  7. Immunization due - Flu vaccine trivalent PF, 6mos and older(Flulaval,Afluria,Fluarix,Fluzone)  8. Submental mass PCP recommended US  of suspicious area. Patient declined at this time noting stability of area. Will reach out to PCP if desiring to obtain imaging and verbalized understanding of risk.   Orders Placed This Encounter  Procedures   Flu vaccine trivalent PF, 6mos and older(Flulaval,Afluria,Fluarix,Fluzone)   CBC with Differential/Platelet   Comprehensive metabolic panel with GFR   Lipid panel   TSH   VITAMIN D  25 Hydroxy (Vit-D Deficiency, Fractures)   Cologuard   Hemoglobin A1c    PATIENT COUNSELING:  - Encouraged a healthy well-balanced diet. Patient may adjust caloric intake to maintain or achieve ideal body weight. May reduce intake of dietary saturated fat and total fat and have adequate dietary potassium and calcium  preferably from fresh fruits, vegetables, and low-fat dairy products.   - Advised to avoid cigarette smoking. - Discussed with the patient that most people either abstain from alcohol or drink within safe limits (<=14/week and <=4 drinks/occasion for males, <=7/weeks and <= 3 drinks/occasion for females) and that the risk for alcohol disorders and other health effects rises proportionally with the number of drinks per week and how often a drinker exceeds daily limits. - Discussed cessation/primary prevention of drug use and availability of treatment for abuse.  - Discussed sexually transmitted diseases, avoidance of unintended  pregnancy and contraceptive alternatives.  - Stressed the importance of regular exercise - Injury prevention: Discussed safety belts, safety helmets, smoke detector, smoking near bedding or upholstery.  - Dental health: Discussed importance of regular tooth brushing, flossing, and dental visits.   NEXT PREVENTATIVE PHYSICAL DUE IN 1 YEAR.  Return in about 6 months (around 07/07/2024) for Follow up Prediabetes, Hypothyroidism .  Patient to reach out to office if new, worrisome, or unresolved symptoms arise or if no improvement in  patient's condition. Patient verbalized understanding and is agreeable to treatment plan. All questions answered to patient's satisfaction.    Thersia Schuyler Stark, [place]

## 2024-01-08 ENCOUNTER — Ambulatory Visit (HOSPITAL_BASED_OUTPATIENT_CLINIC_OR_DEPARTMENT_OTHER): Payer: Self-pay | Admitting: Family Medicine

## 2024-01-08 LAB — CBC WITH DIFFERENTIAL/PLATELET
Basophils Absolute: 0 x10E3/uL (ref 0.0–0.2)
Basos: 0 %
EOS (ABSOLUTE): 0 x10E3/uL (ref 0.0–0.4)
Eos: 1 %
Hematocrit: 40.3 % (ref 34.0–46.6)
Hemoglobin: 13.5 g/dL (ref 11.1–15.9)
Immature Grans (Abs): 0 x10E3/uL (ref 0.0–0.1)
Immature Granulocytes: 0 %
Lymphocytes Absolute: 2.5 x10E3/uL (ref 0.7–3.1)
Lymphs: 32 %
MCH: 30.8 pg (ref 26.6–33.0)
MCHC: 33.5 g/dL (ref 31.5–35.7)
MCV: 92 fL (ref 79–97)
Monocytes Absolute: 0.4 x10E3/uL (ref 0.1–0.9)
Monocytes: 5 %
Neutrophils Absolute: 4.8 x10E3/uL (ref 1.4–7.0)
Neutrophils: 62 %
Platelets: 199 x10E3/uL (ref 150–450)
RBC: 4.39 x10E6/uL (ref 3.77–5.28)
RDW: 11.9 % (ref 11.7–15.4)
WBC: 7.8 x10E3/uL (ref 3.4–10.8)

## 2024-01-08 LAB — COMPREHENSIVE METABOLIC PANEL WITH GFR
ALT: 27 IU/L (ref 0–32)
AST: 23 IU/L (ref 0–40)
Albumin: 4.7 g/dL (ref 3.9–4.9)
Alkaline Phosphatase: 90 IU/L (ref 49–135)
BUN/Creatinine Ratio: 24 (ref 12–28)
BUN: 19 mg/dL (ref 8–27)
Bilirubin Total: 0.4 mg/dL (ref 0.0–1.2)
CO2: 22 mmol/L (ref 20–29)
Calcium: 9.7 mg/dL (ref 8.7–10.3)
Chloride: 105 mmol/L (ref 96–106)
Creatinine, Ser: 0.78 mg/dL (ref 0.57–1.00)
Globulin, Total: 2.5 g/dL (ref 1.5–4.5)
Glucose: 98 mg/dL (ref 70–99)
Potassium: 4.3 mmol/L (ref 3.5–5.2)
Sodium: 141 mmol/L (ref 134–144)
Total Protein: 7.2 g/dL (ref 6.0–8.5)
eGFR: 86 mL/min/1.73 (ref >59)

## 2024-01-08 LAB — VITAMIN D 25 HYDROXY (VIT D DEFICIENCY, FRACTURES): Vit D, 25-Hydroxy: 26.2 ng/mL — ABNORMAL LOW (ref 30.0–100.0)

## 2024-01-08 LAB — TSH: TSH: 4.15 u[IU]/mL (ref 0.450–4.500)

## 2024-01-08 LAB — LIPID PANEL
Chol/HDL Ratio: 3.4 ratio (ref 0.0–4.4)
Cholesterol, Total: 155 mg/dL (ref 100–199)
HDL: 46 mg/dL (ref >39)
LDL Chol Calc (NIH): 88 mg/dL (ref 0–99)
Triglycerides: 116 mg/dL (ref 0–149)
VLDL Cholesterol Cal: 21 mg/dL (ref 5–40)

## 2024-01-08 LAB — HEMOGLOBIN A1C
Est. average glucose Bld gHb Est-mCnc: 126 mg/dL
Hgb A1c MFr Bld: 6 % — ABNORMAL HIGH (ref 4.8–5.6)

## 2024-01-08 NOTE — Progress Notes (Signed)
 Please call patient or send letter with results.  Patient would like a copy of her result note per her last visit. Please let patient know that her vitamin D  was slightly low compared to last year.  She should continue taking a over-the-counter vitamin D3 supplement at least 1000 units with calcium  600 units daily.  Her A1c has improved and is down to 6.0.  She can continue to monitor with diet and exercise.  Her blood counts are stable.  Her electrolytes, kidney and liver function are stable.  She has improved her liver enzymes from last year.  Cholesterol is well-controlled and thyroid  function is normal.

## 2024-01-24 ENCOUNTER — Other Ambulatory Visit: Payer: Self-pay

## 2024-01-24 DIAGNOSIS — E039 Hypothyroidism, unspecified: Secondary | ICD-10-CM | POA: Insufficient documentation

## 2024-01-24 DIAGNOSIS — R0982 Postnasal drip: Secondary | ICD-10-CM | POA: Diagnosis not present

## 2024-01-24 DIAGNOSIS — J029 Acute pharyngitis, unspecified: Secondary | ICD-10-CM | POA: Insufficient documentation

## 2024-01-24 NOTE — ED Triage Notes (Signed)
 Pt POV reporting sore throat and multiple episodes of emesis x3 days. Afebrile.

## 2024-01-24 NOTE — ED Notes (Signed)
 Pt seen in triage by this RT. BLB clear and dim. No distress noted

## 2024-01-25 ENCOUNTER — Emergency Department (HOSPITAL_BASED_OUTPATIENT_CLINIC_OR_DEPARTMENT_OTHER)

## 2024-01-25 ENCOUNTER — Emergency Department (HOSPITAL_BASED_OUTPATIENT_CLINIC_OR_DEPARTMENT_OTHER)
Admission: EM | Admit: 2024-01-25 | Discharge: 2024-01-25 | Disposition: A | Discharge status: Home / Self Care | Attending: Emergency Medicine | Admitting: Emergency Medicine

## 2024-01-25 DIAGNOSIS — R059 Cough, unspecified: Secondary | ICD-10-CM | POA: Diagnosis not present

## 2024-01-25 DIAGNOSIS — J029 Acute pharyngitis, unspecified: Secondary | ICD-10-CM

## 2024-01-25 DIAGNOSIS — R0982 Postnasal drip: Secondary | ICD-10-CM

## 2024-01-25 LAB — RESP PANEL BY RT-PCR (RSV, FLU A&B, COVID)  RVPGX2
Influenza A by PCR: NEGATIVE
Influenza B by PCR: NEGATIVE
Resp Syncytial Virus by PCR: NEGATIVE
SARS Coronavirus 2 by RT PCR: NEGATIVE

## 2024-01-25 LAB — GROUP A STREP BY PCR: Group A Strep by PCR: NOT DETECTED

## 2024-01-25 MED ORDER — SODIUM CHLORIDE 0.9 % IV BOLUS
1000.0000 mL | Freq: Once | INTRAVENOUS | Status: AC
  Administered 2024-01-25: 1000 mL via INTRAVENOUS

## 2024-01-25 MED ORDER — DEXAMETHASONE SOD PHOSPHATE PF 10 MG/ML IJ SOLN
10.0000 mg | Freq: Once | INTRAMUSCULAR | Status: AC
  Administered 2024-01-25: 10 mg via INTRAMUSCULAR

## 2024-01-25 MED ORDER — LORAZEPAM 2 MG/ML IJ SOLN
0.5000 mg | Freq: Once | INTRAMUSCULAR | Status: AC
  Administered 2024-01-25: 0.5 mg via INTRAVENOUS
  Filled 2024-01-25: qty 1

## 2024-01-25 MED ORDER — KETOROLAC TROMETHAMINE 30 MG/ML IJ SOLN
15.0000 mg | Freq: Once | INTRAMUSCULAR | Status: AC
  Administered 2024-01-25: 15 mg via INTRAVENOUS
  Filled 2024-01-25: qty 1

## 2024-01-25 NOTE — ED Notes (Signed)
 Pt provided water for PO challenge

## 2024-01-25 NOTE — Discharge Instructions (Signed)
 You were seen today for sore throat.  Your workup is reassuring.  This is likely a virus.  Continue ibuprofen or Tylenol at home.  You may use Flonase or nasal saline to help with postnasal drip.

## 2024-01-25 NOTE — ED Provider Notes (Signed)
 [place] EM[facility]GENCY DEPARTMENT AT [facility] Provider Note   CSN: [id number] Arrival date & time: 01/24/24  2325     Patient presents with: Sore Throat   Robin Montes is a 63 y.o. female.   HPI     This is a 63 year old female who presents with sore throat, cough and emesis.  Afebrile.  Reports chills.  She states that she feels like something is getting caught in her throat.  Has had some congestion.  No known sick contacts.  Patient declined interpreter.  Wishes for husband to be english as a second language teacher.   Prior to Admission medications   Medication Sig Start Date End Date Taking? Authorizing Provider  Calcium  Carbonate (CALCIUM  600 PO) Take by mouth.    [provider]  Cholecalciferol (VITAMIN D  PO) Take 5,000 Int'l Units by mouth daily.    [provider]  clobetasol  cream (TEMOVATE ) 0.05 % Apply 1 Application topically 2 (two) times daily. 08/20/23   Knute Thersia Bitters, FNP  levothyroxine  (SYNTHROID ) 125 MCG tablet Take 1 tablet (125 mcg total) by mouth daily before breakfast. 07/02/23   Caudle, Thersia Bitters, FNP  MAGNESIUM PO Take by mouth.    [provider]  pregabalin  (LYRICA ) 25 MG capsule Take 1 capsule by mouth twice daily 11/05/23   Caudle, Alexis Olivia, FNP  rosuvastatin  (CRESTOR ) 5 MG tablet Take 1 tablet (5 mg total) by mouth at bedtime. 07/02/23   Caudle, Thersia Bitters, FNP    Allergies: Atorvastatin     Review of Systems  Constitutional:  Negative for chills and fever.  HENT:  Positive for congestion and sore throat.   Respiratory:  Positive for cough. Negative for shortness of breath.   Gastrointestinal:  Positive for nausea and vomiting.  All other systems reviewed and are negative.   Updated Vital Signs BP 136/64 (BP Location: Left Arm)   Pulse 80   Temp 97.9 F (36.6 C) (Oral)   Resp 17   SpO2 97%   Physical Exam Vitals and nursing note reviewed.  Constitutional:      Appearance: She is well-developed. She  is obese.  HENT:     Head: Normocephalic and atraumatic.     Nose: Congestion present.     Mouth/Throat:     Mouth: Mucous membranes are moist.     Comments: Uvula midline, no significant tonsillar exudate or swelling, no asymmetry, slight erythema posterior oropharynx Eyes:     Pupils: Pupils are equal, round, and reactive to light.  Cardiovascular:     Rate and Rhythm: Normal rate and regular rhythm.     Heart sounds: Normal heart sounds.  Pulmonary:     Effort: Pulmonary effort is normal. No respiratory distress.     Breath sounds: No wheezing.  Abdominal:     General: Bowel sounds are normal.     Palpations: Abdomen is soft.  Musculoskeletal:     Cervical back: Neck supple.  Skin:    General: Skin is warm and dry.  Neurological:     Mental Status: She is alert and oriented to person, place, and time.  Psychiatric:     Comments: Anxious appearing     (all labs ordered are listed, but only abnormal results are displayed) Labs Reviewed  GROUP A STREP BY PCR  RESP PANEL BY RT-PCR (RSV, FLU A&B, COVID)  [id number]    EKG: None  Radiology: DG Chest Portable 1 View Result Date: 01/25/2024 EXAM: 1 VIEW(S) XRAY OF THE CHEST 01/25/2024 02:06:00 AM COMPARISON: None available. CLINICAL  HISTORY: cough FINDINGS: LUNGS AND PLEURA: No focal pulmonary opacity. No pleural effusion. No pneumothorax. HEART AND MEDIASTINUM: No acute abnormality of the cardiac and mediastinal silhouettes. BONES AND SOFT TISSUES: No acute osseous abnormality. IMPRESSION: 1. No acute process. Electronically signed by: Dorethia Molt MD 01/25/2024 02:17 AM EST RP Workstation: [id number]     Procedures   Medications Ordered in the ED  dexamethasone (DECADRON) injection 10 mg (10 mg Intramuscular Given 01/25/24 0208)  sodium chloride 0.9 % bolus 1,000 mL (1,000 mLs Intravenous New Bag/Given 01/25/24 0254)  LORazepam  (ATIVAN ) injection 0.5 mg (0.5 mg Intravenous Given 01/25/24 0255)  ketorolac (TORADOL) 30  MG/ML injection 15 mg (15 mg Intravenous Given 01/25/24 0254)                                    Medical Decision Making Amount and/or Complexity of Data Reviewed Radiology: ordered.  Risk Prescription drug management.   This patient presents to the ED for concern of sore throat, this involves an extensive number of treatment options, and is a complaint that carries with it a high risk of complications and morbidity.  I considered the following differential and admission for this acute, potentially life threatening condition.  The differential diagnosis includes viral illness, COVID, influenza, strep  MDM:    This is a 63 year old female who presents with upper respiratory symptoms, predominantly sore throat.  She is nontoxic and vital signs are reassuring.  She ruminates on yelling like she has a foreign body sensation in her throat.  She does have some erythema to the posterior pharynx, may have some postnasal drip given congestion.  Chest x-ray without pneumonia.  COVID, flu, strep are all negative.  Patient was given a dose of Decadron for any oropharyngeal swelling, fluids, and Toradol.  On recheck, clinically improved.  Recommend supportive measures at home.  (Labs, imaging, consults)  Labs: I Ordered, and personally interpreted labs.  The pertinent results include: COVID, flu, strep  Imaging Studies ordered: I ordered imaging studies including chest x-ray I independently visualized and interpreted imaging. I agree with the radiologist interpretation  Additional history obtained from chart review.  External records from outside source obtained and reviewed including prior evaluations  Cardiac Monitoring: The patient was maintained on a cardiac monitor.  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   Fibrocystic breast disease    GERD (gastroesophageal reflux disease)    Hyperlipidemia    Hypothyroidism    Insomnia 08/11/2017   Labile hypertension    Migraine    Postoperative hypothyroidism 01/07/2024   Prediabetes      Medicines Meds ordered this encounter  Medications   dexamethasone (DECADRON) injection 10 mg   sodium chloride 0.9 % bolus 1,000 mL   LORazepam  (ATIVAN ) injection 0.5 mg   ketorolac (TORADOL) 30 MG/ML injection 15 mg    I have reviewed the patients home medicines and have made adjustments as needed  Problem List / ED Course: Problem List Items Addressed This Visit   None  Visit Diagnoses       Sore throat    -  Primary     Post-nasal drip                    Final diagnoses:  Sore throat  Post-nasal drip    ED Discharge Orders     None          Bari Charmaine FALCON, MD 01/25/24 [phone]

## 2024-01-27 LAB — COLOGUARD: COLOGUARD: NEGATIVE

## 2024-01-28 ENCOUNTER — Telehealth (HOSPITAL_BASED_OUTPATIENT_CLINIC_OR_DEPARTMENT_OTHER): Payer: Self-pay | Admitting: Family Medicine

## 2024-01-28 ENCOUNTER — Emergency Department (HOSPITAL_BASED_OUTPATIENT_CLINIC_OR_DEPARTMENT_OTHER)

## 2024-01-28 ENCOUNTER — Other Ambulatory Visit: Payer: Self-pay

## 2024-01-28 ENCOUNTER — Emergency Department (HOSPITAL_BASED_OUTPATIENT_CLINIC_OR_DEPARTMENT_OTHER)
Admission: EM | Admit: 2024-01-28 | Discharge: 2024-01-28 | Disposition: A | Discharge status: Home / Self Care | Attending: Emergency Medicine | Admitting: Emergency Medicine

## 2024-01-28 DIAGNOSIS — R0602 Shortness of breath: Secondary | ICD-10-CM | POA: Diagnosis not present

## 2024-01-28 DIAGNOSIS — R1310 Dysphagia, unspecified: Secondary | ICD-10-CM | POA: Diagnosis present

## 2024-01-28 DIAGNOSIS — E876 Hypokalemia: Secondary | ICD-10-CM | POA: Insufficient documentation

## 2024-01-28 DIAGNOSIS — E039 Hypothyroidism, unspecified: Secondary | ICD-10-CM | POA: Insufficient documentation

## 2024-01-28 DIAGNOSIS — R9431 Abnormal electrocardiogram [ECG] [EKG]: Secondary | ICD-10-CM | POA: Diagnosis not present

## 2024-01-28 LAB — BASIC METABOLIC PANEL WITH GFR
Anion gap: 12 (ref 5–15)
BUN: 12 mg/dL (ref 8–23)
CO2: 23 mmol/L (ref 22–32)
Calcium: 9.6 mg/dL (ref 8.9–10.3)
Chloride: 107 mmol/L (ref 98–111)
Creatinine, Ser: 0.71 mg/dL (ref 0.44–1.00)
GFR, Estimated: >60 mL/min (ref >60)
Glucose, Bld: 103 mg/dL — ABNORMAL HIGH (ref 70–99)
Potassium: 3.4 mmol/L — ABNORMAL LOW (ref 3.5–5.1)
Sodium: 142 mmol/L (ref 135–145)

## 2024-01-28 LAB — CBC WITH DIFFERENTIAL/PLATELET
Abs Immature Granulocytes: 0.01 K/uL (ref 0.00–0.07)
Basophils Absolute: 0 K/uL (ref 0.0–0.1)
Basophils Relative: 1 %
Eosinophils Absolute: 0.1 K/uL (ref 0.0–0.5)
Eosinophils Relative: 1 %
HCT: 37.1 % (ref 36.0–46.0)
Hemoglobin: 12.6 g/dL (ref 12.0–15.0)
Immature Granulocytes: 0 %
Lymphocytes Relative: 34 %
Lymphs Abs: 2.2 K/uL (ref 0.7–4.0)
MCH: 30.5 pg (ref 26.0–34.0)
MCHC: 34 g/dL (ref 30.0–36.0)
MCV: 89.8 fL (ref 80.0–100.0)
Monocytes Absolute: 0.3 K/uL (ref 0.1–1.0)
Monocytes Relative: 5 %
Neutro Abs: 3.9 K/uL (ref 1.7–7.7)
Neutrophils Relative %: 59 %
Platelets: 205 K/uL (ref 150–400)
RBC: 4.13 MIL/uL (ref 3.87–5.11)
RDW: 12 % (ref 11.5–15.5)
WBC: 6.5 K/uL (ref 4.0–10.5)
nRBC: 0 % (ref 0.0–0.2)

## 2024-01-28 MED ORDER — IOHEXOL 300 MG/ML  SOLN
100.0000 mL | Freq: Once | INTRAMUSCULAR | Status: AC | PRN
  Administered 2024-01-28: 75 mL via INTRAVENOUS

## 2024-01-28 MED ORDER — AMOXICILLIN 500 MG PO CAPS: 500.0000 mg | ORAL_CAPSULE | Freq: Two times a day (BID) | ORAL | 0 refills | Status: AC

## 2024-01-28 MED ORDER — DEXAMETHASONE SOD PHOSPHATE PF 10 MG/ML IJ SOLN
10.0000 mg | Freq: Once | INTRAMUSCULAR | Status: AC
  Administered 2024-01-28: 10 mg via INTRAVENOUS

## 2024-01-28 NOTE — ED Provider Notes (Signed)
 [place] EMERGENCY DEPARTMENT AT [facility] Provider Note   CSN: [id number] Arrival date & time: 01/28/24  9180     Patient presents with: Sore Throat   Robin Montes is a 64 y.o. female with a past medical history significant for hyperlipidemia, hypothyroidism, depression, and B12 deficiency who presents to the ED due to difficulties swallowing.  Patient was seen in the ED on 11/15 for the same complaint with reassuring workup. Patient states the pain has resolved however, notes she is still having difficulties swallowing.  Notes symptoms started after eating steak 6 days ago which got stuck in her throat causing her to then vomit. She notes since then, she has been having difficulties swallowing both solids and liquids.  Admits to a cough.  No fever or chills.  Also notices some shortness of breath.  When patient was seen in the ED on 11/15 strep and RVP negative. No difficulties swallowing prior to last week.   History obtained from patient and past medical records.  Declined official interpreter and wished to have husband interpret during entire encounter      Prior to Admission medications   Medication Sig Start Date End Date Taking? Authorizing Provider  amoxicillin (AMOXIL) 500 MG capsule Take 1 capsule (500 mg total) by mouth 2 (two) times daily for 10 days. 01/28/24 02/07/24 Yes Ravis Herne, Aleck BROCKS, PA-C  Calcium  Carbonate (CALCIUM  600 PO) Take by mouth.    [provider]  Cholecalciferol (VITAMIN D  PO) Take 5,000 Int'l Units by mouth daily.    [provider]  clobetasol  cream (TEMOVATE ) 0.05 % Apply 1 Application topically 2 (two) times daily. 08/20/23   Knute Thersia Bitters, FNP  levothyroxine  (SYNTHROID ) 125 MCG tablet Take 1 tablet (125 mcg total) by mouth daily before breakfast. 07/02/23   Caudle, Thersia Bitters, FNP  MAGNESIUM PO Take by mouth.    [provider]  pregabalin  (LYRICA ) 25 MG capsule Take 1 capsule by mouth twice daily  11/05/23   Caudle, Alexis Olivia, FNP  rosuvastatin  (CRESTOR ) 5 MG tablet Take 1 tablet (5 mg total) by mouth at bedtime. 07/02/23   Caudle, Thersia Bitters, FNP    Allergies: Atorvastatin     Review of Systems  Constitutional:  Negative for chills and fever.  HENT:  Positive for trouble swallowing. Negative for sore throat and voice change.   Respiratory:  Positive for cough and shortness of breath.     Updated Vital Signs BP 127/82   Pulse 66   Temp 98.6 F (37 C) (Oral)   Resp 16   Ht 5' 4 (1.626 m)   Wt 90.3 kg   SpO2 99%   BMI 34.17 kg/m   Physical Exam Vitals and nursing note reviewed.  Constitutional:      General: She is not in acute distress.    Appearance: She is not ill-appearing.  HENT:     Head: Normocephalic.     Mouth/Throat:     Comments: Airway patent. Does have some tonsillary hypertrophy. Uvula midline. Tolerating oral secretions without difficulty.  Eyes:     Pupils: Pupils are equal, round, and reactive to light.  Cardiovascular:     Rate and Rhythm: Normal rate and regular rhythm.     Pulses: Normal pulses.     Heart sounds: Normal heart sounds. No murmur heard.    No friction rub. No gallop.  Pulmonary:     Effort: Pulmonary effort is normal.     Breath sounds: Normal breath sounds.  Comments: Abdomen soft, nondistended, nontender to palpation in all quadrants without guarding or peritoneal signs. No rebound.  Abdominal:     General: Abdomen is flat. There is no distension.     Palpations: Abdomen is soft.     Tenderness: There is no abdominal tenderness. There is no guarding or rebound.  Musculoskeletal:        General: Normal range of motion.     Cervical back: Neck supple.  Skin:    General: Skin is warm and dry.  Neurological:     General: No focal deficit present.     Mental Status: She is alert.  Psychiatric:        Mood and Affect: Mood normal.        Behavior: Behavior normal.     (all labs ordered are listed, but only  abnormal results are displayed) Labs Reviewed  BASIC METABOLIC PANEL WITH GFR - Abnormal; Notable for the following components:      Result Value   Potassium 3.4 (*)    Glucose, Bld 103 (*)    All other components within normal limits  CBC WITH DIFFERENTIAL/PLATELET    EKG: EKG Interpretation Date/Time:  Tuesday January 28 2024 09:48:34 EST Ventricular Rate:  71 PR Interval:  193 QRS Duration:  94 QT Interval:  412 QTC Calculation: 448 R Axis:   73  Text Interpretation: Sinus rhythm Low voltage, precordial leads Confirmed by Geraldene Hamilton [phone]) on 01/28/2024 9:51:29 AM  Radiology: CT Soft Tissue Neck W Contrast Result Date: 01/28/2024 EXAM: CT NECK WITH CONTRAST 01/28/2024 10:39:00 AM TECHNIQUE: CT of the neck was performed with the administration of 75 mL of iohexol (OMNIPAQUE) 300 MG/ML solution. Multiplanar reformatted images are provided for review. Automated exposure control, iterative reconstruction, and/or weight based adjustment of the mA/kV was utilized to reduce the radiation dose to as low as reasonably achievable. COMPARISON: CT head 01/11/2011. CLINICAL HISTORY: difficulties swallowing- foreign body sensation. FINDINGS: AERODIGESTIVE TRACT: No discrete mass. No radiopaque foreign body. No edema. SALIVARY GLANDS: The parotid and submandibular glands are unremarkable. THYROID : Atrophic thyroid  gland. LYMPH NODES: No suspicious cervical lymphadenopathy. SOFT TISSUES: No mass or fluid collection. BRAIN, ORBITS, SINUSES AND MASTOIDS: Small fluid level left sphenoid sinus. LUNGS AND MEDIASTINUM: No acute abnormality. BONES: No focal bone abnormality. IMPRESSION: 1. No aerodigestive tract mass or radiopaque foreign body. Electronically signed by: prentice bybordi 01/28/2024 11:31 AM EST RP Workstation: [id number]   DG Chest Portable 1 View Result Date: 01/28/2024 EXAM: 1 VIEW(S) XRAY OF THE CHEST 01/28/2024 09:53:00 AM COMPARISON: Comparison 01/25/2024. CLINICAL HISTORY: SOB  FINDINGS: LUNGS AND PLEURA: No focal pulmonary opacity. No pleural effusion. No pneumothorax. HEART AND MEDIASTINUM: No acute abnormality of the cardiac and mediastinal silhouettes. BONES AND SOFT TISSUES: No acute osseous abnormality. IMPRESSION: 1. No acute cardiopulmonary process. Electronically signed by: Lynwood Seip MD 01/28/2024 10:16 AM EST RP Workstation: [id number]     Procedures   Medications Ordered in the ED  dexamethasone (DECADRON) injection 10 mg (10 mg Intravenous Given 01/28/24 0940)  iohexol (OMNIPAQUE) 300 MG/ML solution 100 mL (75 mLs Intravenous Contrast Given 01/28/24 1033)                                    Medical Decision Making Amount and/or Complexity of Data Reviewed Independent Historian: spouse Labs: ordered. Decision-making details documented in ED Course. Radiology: ordered and independent interpretation performed. Decision-making details documented in ED Course. ECG/medicine  tests: ordered and independent interpretation performed. Decision-making details documented in ED Course.  Risk Prescription drug management.   This patient presents to the ED for concern of difficulty swallowing, this involves an extensive number of treatment options, and is a complaint that carries with it a high risk of complications and morbidity.  The differential diagnosis includes abscess, foreign body, viral process, etc  63 year old female presents to the ED due to difficulties swallowing x 6 days.  Seen in the ED on 11/15 for sore throat with reassuring workup.  Patient notes symptoms started after steak got stuck in her throat 6 days ago.  Also endorses some difficulties breathing.  Upon arrival patient afebrile, mildly tachycardic at 103 with normal O2 saturation.  Patient well-appearing on exam.  Throat with mild erythema.  Uvula midline.  Slight Tonsillar hypertrophy.  Tolerating oral secretions without difficulty.  Airway patent.  Lungs clear to auscultation bilaterally  without stridor or wheeze. No evidence of respiratory distress.  RVP and strep test negative on 11/15.  Will obtain CT neck to rule out foreign body vs abscess. IV decadron given. Routine labs.   CBC with no leukocytosis.  Normal hemoglobin.  BMP with mild hypokalemia 3.4.  Normal renal function.  Chest x-ray personally reviewed and interpreted which is negative for signs of pneumonia, pneumothorax, or widened mediastinum.  CT neck negative for any acute abnormalities.  No evidence of abscess or foreign body.  EKG normal sinus rhythm.  No signs of acute ischemia.  11:48 AM reassessed patient at bedside.  Patient tolerating p.o. without difficulty.  Unclear etiology of patient's symptoms however, is not displaying any difficulty swallowing here in the ED.  Lungs without stridor or wheeze.  O2 saturation remained above 95% during patient's entire ED stay.  No signs of respiratory distress.  No other neurological symptoms to suggest neurological etiology. Will cover with antibiotic for possible pharyngitis.  Given subjective difficulty swallowing will refer to ENT for further evaluation.  Patient stable for discharge. Strict ED precautions discussed with patient. Patient states understanding and agrees to plan. Patient discharged home in no acute distress and stable vitals  Co morbidities that complicate the patient evaluation  Hyperlipidemia Cardiac Monitoring: / EKG:  The patient was maintained on a cardiac monitor.  I personally viewed and interpreted the cardiac monitored which showed an underlying rhythm of: NSR, HR 71  Social Determinants of Health:  Language barrier  Test / Admission - Considered:  Considered admission; however patient able to tolerate po without difficulty. No evidence of food impaction. Negative CT neck. Patient stable for discharge.      Final diagnoses:  Dysphagia, unspecified type    ED Discharge Orders          Ordered    amoxicillin (AMOXIL) 500 MG capsule  2  times daily        01/28/24 1154               Claiborne Stroble C, PA-C 01/28/24 1906    Zackowski, Scott, MD 01/30/24 1024

## 2024-01-28 NOTE — Telephone Encounter (Signed)
 Due to symptoms she was instructed to go to the ED.

## 2024-01-28 NOTE — Discharge Instructions (Addendum)
 It was a pleasure taking care of you today.  As discussed, your workup was reassuring.  I am sending you home with an antibiotic.  Take as prescribed and finish all antibiotics.  I have included the number of the ear nose and throat doctor.  Please call to schedule an appointment for further evaluation.  Return to the ER for any worsening symptoms.

## 2024-01-28 NOTE — ED Triage Notes (Signed)
 Pt cox4 ambulatory c/o inflammation and phlegm in throat stating she was seen a few days ago in ED for same, denies pain but states when she tries to eat and drink she feels like her throat is tight and that food gets stuck. Denies fever at home.

## 2024-01-28 NOTE — Progress Notes (Signed)
Your cologuard test is negative for colon cancer from your fecal matter sample.    This is not an all inclusive test and is not as specific as a direct visualization of the colon with a colonoscopy. Cologuard thus may provide a false negative result. It is recommended to have a colonoscopy at least every 10 years or more frequently as indicated by a GI provider. You can repeat your Cologuard test in 3 years. If you would like to have a colonoscopy please call the office at any time and we would be happy to order this for you.

## 2024-01-28 NOTE — Telephone Encounter (Signed)
 Patient came by office stating they were in the ED and would like to be seen today her husband said she has a swollen throat and cannot breathe well. Please advise thank you

## 2024-01-31 ENCOUNTER — Other Ambulatory Visit: Payer: Self-pay

## 2024-01-31 ENCOUNTER — Emergency Department (HOSPITAL_BASED_OUTPATIENT_CLINIC_OR_DEPARTMENT_OTHER)
Admission: EM | Admit: 2024-01-31 | Discharge: 2024-01-31 | Disposition: A | Discharge status: Home / Self Care | Attending: Emergency Medicine | Admitting: Emergency Medicine

## 2024-01-31 DIAGNOSIS — D72829 Elevated white blood cell count, unspecified: Secondary | ICD-10-CM | POA: Diagnosis not present

## 2024-01-31 DIAGNOSIS — R55 Syncope and collapse: Secondary | ICD-10-CM | POA: Insufficient documentation

## 2024-01-31 DIAGNOSIS — I10 Essential (primary) hypertension: Secondary | ICD-10-CM | POA: Diagnosis not present

## 2024-01-31 DIAGNOSIS — E039 Hypothyroidism, unspecified: Secondary | ICD-10-CM | POA: Diagnosis not present

## 2024-01-31 DIAGNOSIS — F419 Anxiety disorder, unspecified: Secondary | ICD-10-CM | POA: Diagnosis not present

## 2024-01-31 DIAGNOSIS — Z79899 Other long term (current) drug therapy: Secondary | ICD-10-CM | POA: Diagnosis not present

## 2024-01-31 DIAGNOSIS — R1310 Dysphagia, unspecified: Secondary | ICD-10-CM | POA: Diagnosis not present

## 2024-01-31 LAB — COMPREHENSIVE METABOLIC PANEL WITH GFR
ALT: 33 U/L (ref 0–44)
AST: 38 U/L (ref 15–41)
Albumin: 4.3 g/dL (ref 3.5–5.0)
Alkaline Phosphatase: 78 U/L (ref 38–126)
Anion gap: 14 (ref 5–15)
BUN: 11 mg/dL (ref 8–23)
CO2: 19 mmol/L — ABNORMAL LOW (ref 22–32)
Calcium: 9.1 mg/dL (ref 8.9–10.3)
Chloride: 105 mmol/L (ref 98–111)
Creatinine, Ser: 0.65 mg/dL (ref 0.44–1.00)
GFR, Estimated: >60 mL/min (ref >60)
Glucose, Bld: 100 mg/dL — ABNORMAL HIGH (ref 70–99)
Potassium: 4.5 mmol/L (ref 3.5–5.1)
Sodium: 138 mmol/L (ref 135–145)
Total Bilirubin: 0.6 mg/dL (ref 0.0–1.2)
Total Protein: 6.8 g/dL (ref 6.5–8.1)

## 2024-01-31 LAB — CBC
HCT: 39.8 % (ref 36.0–46.0)
Hemoglobin: 13.8 g/dL (ref 12.0–15.0)
MCH: 31 pg (ref 26.0–34.0)
MCHC: 34.7 g/dL (ref 30.0–36.0)
MCV: 89.4 fL (ref 80.0–100.0)
Platelets: 175 K/uL (ref 150–400)
RBC: 4.45 MIL/uL (ref 3.87–5.11)
RDW: 12.2 % (ref 11.5–15.5)
WBC: 9.7 K/uL (ref 4.0–10.5)
nRBC: 0 % (ref 0.0–0.2)

## 2024-01-31 LAB — CBG MONITORING, ED: Glucose-Capillary: 97 mg/dL (ref 70–99)

## 2024-01-31 MED ORDER — MIDAZOLAM HCL (PF) 2 MG/2ML IJ SOLN
2.0000 mg | Freq: Once | INTRAMUSCULAR | Status: AC
  Administered 2024-01-31: 2 mg via INTRAVENOUS
  Filled 2024-01-31: qty 2

## 2024-01-31 MED ORDER — DEXAMETHASONE SOD PHOSPHATE PF 10 MG/ML IJ SOLN
10.0000 mg | Freq: Once | INTRAMUSCULAR | Status: AC
  Administered 2024-01-31: 10 mg via INTRAVENOUS

## 2024-01-31 MED ORDER — DIPHENHYDRAMINE HCL 50 MG/ML IJ SOLN
50.0000 mg | Freq: Once | INTRAMUSCULAR | Status: AC
  Administered 2024-01-31: 50 mg via INTRAVENOUS
  Filled 2024-01-31: qty 1

## 2024-01-31 NOTE — Discharge Instructions (Signed)
 You were seen in the emergency department again for trouble with swallowing You were able to swallow water here We gave you another dose of steroids We gave you dose of Benadryl  to help with sleeping tonight Follow-up with ENT as scheduled for Tuesday Return to the Emergency Department for trouble breathing if you are unable to swallow or have any other concerns

## 2024-01-31 NOTE — ED Triage Notes (Addendum)
 Pt POV, syncopal episode in ED parking lot, assisted to stretcher by staff, pt reporting persistent dysphagia r/t phlegm stuck in throat, seen for same 11/18.  Syncopal episode witnessed by husband, did not hit head on ground.   ENT appt scheduled for Tuesday

## 2024-01-31 NOTE — ED Provider Notes (Signed)
 [place] EMERGENCY DEPARTMENT AT [facility] Provider Note   CSN: [id number] Arrival date & time: 01/31/24  1810     Patient presents with: Loss of Consciousness   Robin Montes is a 63 y.o. female.  With a history of hypothyroidism status post thyroid  ablation, GERD who presents to the ED for dysphagia.  Patient was seen in this emergency department 3 days agoFor same complaint.  Also seen on November 15.  Both times workup is reassuring including CT soft tissue neck strep respiratory viral panel and laboratory workup.  She is intending on following up with ENT in 4 days.  Husband states she was able to eat toast and eggs this morning but feels as though her throat is closing up throughout the day.  Episodes seem to come on randomly.  Reports increased phlegm.  She was very anxious and had what was described as a syncopal episode upfront in triage.  Very anxious on my assessment.  No nausea vomiting fevers chills    Loss of Consciousness      Prior to Admission medications   Medication Sig Start Date End Date Taking? Authorizing Provider  amoxicillin  (AMOXIL ) 500 MG capsule Take 1 capsule (500 mg total) by mouth 2 (two) times daily for 10 days. 01/28/24 02/07/24  Lorelle Aleck BROCKS, PA-C  Calcium  Carbonate (CALCIUM  600 PO) Take by mouth.    [provider]  Cholecalciferol (VITAMIN D  PO) Take 5,000 Int'l Units by mouth daily.    [provider]  clobetasol  cream (TEMOVATE ) 0.05 % Apply 1 Application topically 2 (two) times daily. 08/20/23   Knute Thersia Bitters, FNP  levothyroxine  (SYNTHROID ) 125 MCG tablet Take 1 tablet (125 mcg total) by mouth daily before breakfast. 07/02/23   Caudle, Thersia Bitters, FNP  MAGNESIUM PO Take by mouth.    [provider]  pregabalin  (LYRICA ) 25 MG capsule Take 1 capsule by mouth twice daily 11/05/23   Caudle, Alexis Olivia, FNP  rosuvastatin  (CRESTOR ) 5 MG tablet Take 1 tablet (5 mg total) by mouth at bedtime. 07/02/23    Caudle, Thersia Bitters, FNP    Allergies: Atorvastatin     Review of Systems  Cardiovascular:  Positive for syncope.    Updated Vital Signs BP 132/73 (BP Location: Right Arm)   Pulse 81   Temp 98 F (36.7 C) (Oral)   Resp 17   SpO2 100%   Physical Exam Vitals and nursing note reviewed.  Constitutional:      General: She is in acute distress.     Comments: Anxious  HENT:     Head: Normocephalic and atraumatic.     Mouth/Throat:     Pharynx: No oropharyngeal exudate or posterior oropharyngeal erythema.     Comments: No appreciable swelling of the floor of the mouth No tonsillar edema Uvula midline Patient able to speak in full sentences Spitting saliva into a bag Eyes:     Pupils: Pupils are equal, round, and reactive to light.  Cardiovascular:     Rate and Rhythm: Normal rate and regular rhythm.  Pulmonary:     Effort: Pulmonary effort is normal.     Breath sounds: Normal breath sounds.  Abdominal:     Palpations: Abdomen is soft.     Tenderness: There is no abdominal tenderness.  Musculoskeletal:     Cervical back: Neck supple. No rigidity or tenderness.  Skin:    General: Skin is warm and dry.  Neurological:     Mental Status: She is alert.  Psychiatric:  Mood and Affect: Mood normal.     (all labs ordered are listed, but only abnormal results are displayed) Labs Reviewed  COMPREHENSIVE METABOLIC PANEL WITH GFR - Abnormal; Notable for the following components:      Result Value   CO2 19 (*)    Glucose, Bld 100 (*)    All other components within normal limits  CBC  URINALYSIS, ROUTINE W REFLEX MICROSCOPIC  CBG MONITORING, ED    EKG: None  Radiology: No results found.   Procedures   Medications Ordered in the ED  diphenhydrAMINE  (BENADRYL ) injection 50 mg (has no administration in time range)  midazolam  PF (VERSED ) injection 2 mg (2 mg Intravenous Given 01/31/24 1840)  midazolam  PF (VERSED ) injection 2 mg (2 mg Intravenous Given 01/31/24  2002)  dexamethasone  (DECADRON ) injection 10 mg (10 mg Intravenous Given 01/31/24 2156)    Clinical Course as of 01/31/24 2248  Fri Jan 31, 2024  2248 Patient feeling better after some Decadron .  Able to drink water.  Now calm.  Will give a dose of Benadryl  to help with symptoms and allow her to sleep tonight.  She will follow-up with ENT as scheduled.  Her and her husband of the return precautions to come back for. [MP]    Clinical Course User Index [MP] Pamella Ozell LABOR, DO                                 Medical Decision Making 63 year old female with history as above returns for another episode of dysphagia.  Was able to eat breakfast toast and eggs this morning but has random episodes of dysphagia and sensation of something stuck in her throat throughout the day.  Last 2 ED workups were negative including workup of CT soft tissue neck from 3 days ago.  Will start with laboratory workup.  If there is significant laboratory abnormalities such as leukocytosis that would prompt concern may repeat imaging.  She is very anxious.  Will try Versed  and reassess.  Airway is intact she is spitting secretions into a bag but this oddly seems to come and go.  Amount and/or Complexity of Data Reviewed Labs: ordered.  Risk Prescription drug management.        Final diagnoses:  Dysphagia, unspecified type    ED Discharge Orders     None          Pamella Ozell LABOR, DO 01/31/24 2248

## 2024-02-04 DIAGNOSIS — R1310 Dysphagia, unspecified: Secondary | ICD-10-CM | POA: Diagnosis not present

## 2024-02-04 DIAGNOSIS — J029 Acute pharyngitis, unspecified: Secondary | ICD-10-CM | POA: Diagnosis not present

## 2024-02-12 ENCOUNTER — Encounter: Payer: BC Managed Care – PPO | Admitting: Nurse Practitioner

## 2024-03-17 DIAGNOSIS — Z1231 Encounter for screening mammogram for malignant neoplasm of breast: Secondary | ICD-10-CM

## 2024-03-29 ENCOUNTER — Other Ambulatory Visit (HOSPITAL_BASED_OUTPATIENT_CLINIC_OR_DEPARTMENT_OTHER): Payer: Self-pay | Admitting: Family Medicine

## 2024-03-29 DIAGNOSIS — E039 Hypothyroidism, unspecified: Secondary | ICD-10-CM

## 2024-04-07 ENCOUNTER — Ambulatory Visit

## 2024-04-14 ENCOUNTER — Ambulatory Visit

## 2024-04-28 ENCOUNTER — Ambulatory Visit

## 2024-07-14 ENCOUNTER — Ambulatory Visit (HOSPITAL_BASED_OUTPATIENT_CLINIC_OR_DEPARTMENT_OTHER): Admitting: Family Medicine
